# Patient Record
Sex: Male | Born: 1950
Health system: Southern US, Community
[De-identification: ages and names within clinical notes are randomized; demographics above are authoritative.]

## PROBLEM LIST (undated history)

## (undated) DIAGNOSIS — K219 Gastro-esophageal reflux disease without esophagitis: Secondary | ICD-10-CM

## (undated) DIAGNOSIS — R569 Unspecified convulsions: Secondary | ICD-10-CM

## (undated) DIAGNOSIS — Z5189 Encounter for other specified aftercare: Secondary | ICD-10-CM

## (undated) DIAGNOSIS — I4892 Unspecified atrial flutter: Secondary | ICD-10-CM

## (undated) DIAGNOSIS — K449 Diaphragmatic hernia without obstruction or gangrene: Secondary | ICD-10-CM

## (undated) DIAGNOSIS — I10 Essential (primary) hypertension: Secondary | ICD-10-CM

## (undated) DIAGNOSIS — I2510 Atherosclerotic heart disease of native coronary artery without angina pectoris: Secondary | ICD-10-CM

## (undated) DIAGNOSIS — E7800 Pure hypercholesterolemia, unspecified: Secondary | ICD-10-CM

## (undated) DIAGNOSIS — K2270 Barrett's esophagus without dysplasia: Secondary | ICD-10-CM

## (undated) DIAGNOSIS — M792 Neuralgia and neuritis, unspecified: Secondary | ICD-10-CM

## (undated) DIAGNOSIS — T18108A Unspecified foreign body in esophagus causing other injury, initial encounter: Secondary | ICD-10-CM

## (undated) DIAGNOSIS — M961 Postlaminectomy syndrome, not elsewhere classified: Secondary | ICD-10-CM

## (undated) DIAGNOSIS — N189 Chronic kidney disease, unspecified: Secondary | ICD-10-CM

## (undated) DIAGNOSIS — M1990 Unspecified osteoarthritis, unspecified site: Secondary | ICD-10-CM

## (undated) DIAGNOSIS — K222 Esophageal obstruction: Secondary | ICD-10-CM

## (undated) DIAGNOSIS — C3490 Malignant neoplasm of unspecified part of unspecified bronchus or lung: Secondary | ICD-10-CM

## (undated) DIAGNOSIS — F909 Attention-deficit hyperactivity disorder, unspecified type: Secondary | ICD-10-CM

## (undated) DIAGNOSIS — J189 Pneumonia, unspecified organism: Secondary | ICD-10-CM

## (undated) DIAGNOSIS — K759 Inflammatory liver disease, unspecified: Secondary | ICD-10-CM

## (undated) DIAGNOSIS — F419 Anxiety disorder, unspecified: Secondary | ICD-10-CM

## (undated) DIAGNOSIS — G4730 Sleep apnea, unspecified: Secondary | ICD-10-CM

## (undated) DIAGNOSIS — J449 Chronic obstructive pulmonary disease, unspecified: Secondary | ICD-10-CM

## (undated) DIAGNOSIS — K648 Other hemorrhoids: Secondary | ICD-10-CM

## (undated) HISTORY — DX: Essential (primary) hypertension: I10

## (undated) HISTORY — DX: Encounter for other specified aftercare: Z51.89

## (undated) HISTORY — PX: DENTAL TRAUMA REPAIR (TOOTH REIMPLANTATION): SHX1450

## (undated) HISTORY — PX: OTHER SURGICAL HISTORY: SHX169

## (undated) HISTORY — PX: SPLENECTOMY: SUR1306

## (undated) HISTORY — DX: Unspecified osteoarthritis, unspecified site: M19.90

## (undated) HISTORY — DX: Postlaminectomy syndrome, not elsewhere classified: M96.1

## (undated) HISTORY — DX: Barrett's esophagus without dysplasia: K22.70

## (undated) HISTORY — DX: Diaphragmatic hernia without obstruction or gangrene: K44.9

## (undated) HISTORY — DX: Unspecified atrial flutter: I48.92

## (undated) HISTORY — DX: Inflammatory liver disease, unspecified: K75.9

## (undated) HISTORY — DX: Unspecified foreign body in esophagus causing other injury, initial encounter: T18.108A

## (undated) HISTORY — PX: BACK SURGERY: SHX140

## (undated) HISTORY — PX: ILIAC ARTERY STENT: SHX1786

## (undated) HISTORY — DX: Esophageal obstruction: K22.2

## (undated) HISTORY — DX: Attention-deficit hyperactivity disorder, unspecified type: F90.9

## (undated) HISTORY — DX: Gastro-esophageal reflux disease without esophagitis: K21.9

## (undated) HISTORY — PX: SHOULDER SURGERY: SHX246

## (undated) HISTORY — DX: Malignant neoplasm of unspecified part of unspecified bronchus or lung: C34.90

## (undated) HISTORY — DX: Pure hypercholesterolemia, unspecified: E78.00

## (undated) HISTORY — PX: KNEE ARTHROSCOPY: SHX127

## (undated) HISTORY — DX: Atherosclerotic heart disease of native coronary artery without angina pectoris: I25.10

## (undated) HISTORY — PX: ROTATOR CUFF REPAIR: SHX139

## (undated) HISTORY — DX: Other hemorrhoids: K64.8

## (undated) HISTORY — PX: CRANIOTOMY: SHX93

## (undated) HISTORY — PX: CORONARY ANGIOPLASTY: SHX604

## (undated) HISTORY — DX: Chronic obstructive pulmonary disease, unspecified: J44.9

## (undated) HISTORY — PX: EYE SURGERY: SHX253

---

## 1998-02-17 ENCOUNTER — Ambulatory Visit (HOSPITAL_COMMUNITY): Admission: RE | Admit: 1998-02-17 | Discharge: 1998-02-17 | Payer: Self-pay | Admitting: Family Medicine

## 1999-09-02 ENCOUNTER — Encounter: Payer: Self-pay | Admitting: Orthopedic Surgery

## 1999-09-02 ENCOUNTER — Encounter: Admission: RE | Admit: 1999-09-02 | Discharge: 1999-09-02 | Payer: Self-pay | Admitting: Orthopedic Surgery

## 2000-03-16 ENCOUNTER — Observation Stay (HOSPITAL_COMMUNITY): Admission: EM | Admit: 2000-03-16 | Discharge: 2000-03-17 | Payer: Self-pay | Admitting: Emergency Medicine

## 2000-03-16 ENCOUNTER — Encounter: Payer: Self-pay | Admitting: Emergency Medicine

## 2000-03-17 ENCOUNTER — Encounter: Payer: Self-pay | Admitting: Cardiology

## 2000-08-24 ENCOUNTER — Encounter: Admission: RE | Admit: 2000-08-24 | Discharge: 2000-08-24 | Payer: Self-pay | Admitting: Family Medicine

## 2000-08-24 ENCOUNTER — Encounter: Payer: Self-pay | Admitting: Family Medicine

## 2000-08-27 ENCOUNTER — Encounter: Admission: RE | Admit: 2000-08-27 | Discharge: 2000-08-27 | Payer: Self-pay | Admitting: Family Medicine

## 2000-08-27 ENCOUNTER — Encounter: Payer: Self-pay | Admitting: Family Medicine

## 2000-09-11 ENCOUNTER — Encounter: Admission: RE | Admit: 2000-09-11 | Discharge: 2000-12-10 | Payer: Self-pay | Admitting: Anesthesiology

## 2000-12-30 ENCOUNTER — Encounter: Admission: RE | Admit: 2000-12-30 | Discharge: 2001-01-23 | Payer: Self-pay | Admitting: Anesthesiology

## 2001-03-03 ENCOUNTER — Ambulatory Visit (HOSPITAL_COMMUNITY): Admission: RE | Admit: 2001-03-03 | Discharge: 2001-03-03 | Payer: Self-pay | Admitting: Neurological Surgery

## 2001-03-03 ENCOUNTER — Encounter: Payer: Self-pay | Admitting: Neurological Surgery

## 2001-05-26 DIAGNOSIS — K222 Esophageal obstruction: Secondary | ICD-10-CM

## 2001-05-26 DIAGNOSIS — T18108A Unspecified foreign body in esophagus causing other injury, initial encounter: Secondary | ICD-10-CM

## 2001-05-26 DIAGNOSIS — K219 Gastro-esophageal reflux disease without esophagitis: Secondary | ICD-10-CM

## 2001-05-26 DIAGNOSIS — K449 Diaphragmatic hernia without obstruction or gangrene: Secondary | ICD-10-CM

## 2001-05-26 HISTORY — DX: Gastro-esophageal reflux disease without esophagitis: K21.9

## 2001-05-26 HISTORY — DX: Diaphragmatic hernia without obstruction or gangrene: K44.9

## 2001-05-26 HISTORY — DX: Unspecified foreign body in esophagus causing other injury, initial encounter: T18.108A

## 2001-05-26 HISTORY — DX: Esophageal obstruction: K22.2

## 2001-09-02 ENCOUNTER — Ambulatory Visit (HOSPITAL_COMMUNITY): Admission: RE | Admit: 2001-09-02 | Discharge: 2001-09-02 | Payer: Self-pay | Admitting: Orthopedic Surgery

## 2001-09-02 ENCOUNTER — Encounter: Payer: Self-pay | Admitting: Orthopedic Surgery

## 2002-04-08 ENCOUNTER — Ambulatory Visit (HOSPITAL_COMMUNITY): Admission: RE | Admit: 2002-04-08 | Discharge: 2002-04-08 | Payer: Self-pay | Admitting: Anesthesiology

## 2002-04-08 ENCOUNTER — Encounter: Payer: Self-pay | Admitting: Anesthesiology

## 2002-05-02 ENCOUNTER — Encounter: Admission: RE | Admit: 2002-05-02 | Discharge: 2002-05-02 | Payer: Self-pay | Admitting: Family Medicine

## 2002-05-02 ENCOUNTER — Encounter: Payer: Self-pay | Admitting: Family Medicine

## 2002-05-17 ENCOUNTER — Ambulatory Visit (HOSPITAL_COMMUNITY): Admission: EM | Admit: 2002-05-17 | Discharge: 2002-05-17 | Payer: Self-pay | Admitting: Emergency Medicine

## 2002-10-11 ENCOUNTER — Encounter: Admission: RE | Admit: 2002-10-11 | Discharge: 2002-10-11 | Payer: Self-pay | Admitting: Orthopedic Surgery

## 2002-10-11 ENCOUNTER — Encounter: Payer: Self-pay | Admitting: Orthopedic Surgery

## 2002-11-09 ENCOUNTER — Ambulatory Visit (HOSPITAL_BASED_OUTPATIENT_CLINIC_OR_DEPARTMENT_OTHER): Admission: RE | Admit: 2002-11-09 | Discharge: 2002-11-09 | Payer: Self-pay | Admitting: Orthopedic Surgery

## 2002-11-15 ENCOUNTER — Encounter: Admission: RE | Admit: 2002-11-15 | Discharge: 2002-12-01 | Payer: Self-pay | Admitting: Orthopedic Surgery

## 2003-08-08 ENCOUNTER — Encounter: Admission: RE | Admit: 2003-08-08 | Discharge: 2003-08-08 | Payer: Self-pay | Admitting: Specialist

## 2003-10-05 ENCOUNTER — Ambulatory Visit (HOSPITAL_COMMUNITY): Admission: RE | Admit: 2003-10-05 | Discharge: 2003-10-05 | Payer: Self-pay | Admitting: Vascular Surgery

## 2003-10-22 ENCOUNTER — Emergency Department (HOSPITAL_COMMUNITY): Admission: EM | Admit: 2003-10-22 | Discharge: 2003-10-22 | Payer: Self-pay | Admitting: Emergency Medicine

## 2003-12-14 ENCOUNTER — Ambulatory Visit (HOSPITAL_COMMUNITY): Admission: RE | Admit: 2003-12-14 | Discharge: 2003-12-15 | Payer: Self-pay | Admitting: Specialist

## 2003-12-14 ENCOUNTER — Encounter (INDEPENDENT_AMBULATORY_CARE_PROVIDER_SITE_OTHER): Payer: Self-pay | Admitting: Specialist

## 2004-04-24 ENCOUNTER — Ambulatory Visit: Payer: Self-pay | Admitting: Oncology

## 2004-06-19 ENCOUNTER — Ambulatory Visit: Payer: Self-pay | Admitting: Oncology

## 2004-08-05 ENCOUNTER — Ambulatory Visit: Payer: Self-pay | Admitting: Oncology

## 2004-12-26 ENCOUNTER — Ambulatory Visit (HOSPITAL_COMMUNITY): Admission: RE | Admit: 2004-12-26 | Discharge: 2004-12-26 | Payer: Self-pay | Admitting: Specialist

## 2005-01-31 ENCOUNTER — Ambulatory Visit: Payer: Self-pay | Admitting: Oncology

## 2005-03-10 ENCOUNTER — Encounter: Admission: RE | Admit: 2005-03-10 | Discharge: 2005-04-08 | Payer: Self-pay | Admitting: Specialist

## 2005-04-04 ENCOUNTER — Ambulatory Visit: Payer: Self-pay | Admitting: Oncology

## 2005-06-23 ENCOUNTER — Ambulatory Visit (HOSPITAL_COMMUNITY): Admission: RE | Admit: 2005-06-23 | Discharge: 2005-06-23 | Payer: Self-pay | Admitting: Specialist

## 2005-07-01 ENCOUNTER — Ambulatory Visit: Payer: Self-pay | Admitting: Oncology

## 2005-09-05 ENCOUNTER — Ambulatory Visit: Payer: Self-pay | Admitting: Oncology

## 2005-09-08 ENCOUNTER — Ambulatory Visit (HOSPITAL_COMMUNITY): Admission: RE | Admit: 2005-09-08 | Discharge: 2005-09-09 | Payer: Self-pay | Admitting: Specialist

## 2005-12-10 ENCOUNTER — Ambulatory Visit: Payer: Self-pay | Admitting: Oncology

## 2005-12-11 LAB — BASIC METABOLIC PANEL
BUN: 17 mg/dL (ref 6–23)
CO2: 27 mEq/L (ref 19–32)
Calcium: 9.4 mg/dL (ref 8.4–10.5)
Chloride: 102 mEq/L (ref 96–112)
Creatinine, Ser: 1.2 mg/dL (ref 0.40–1.50)
Glucose, Bld: 122 mg/dL — ABNORMAL HIGH (ref 70–99)
Potassium: 4.5 mEq/L (ref 3.5–5.3)
Sodium: 137 mEq/L (ref 135–145)

## 2005-12-11 LAB — CBC WITH DIFFERENTIAL (CANCER CENTER ONLY)
BASO#: 0.1 10*3/uL (ref 0.0–0.2)
BASO%: 1.1 % (ref 0.0–2.0)
EOS%: 3.1 % (ref 0.0–7.0)
Eosinophils Absolute: 0.2 10*3/uL (ref 0.0–0.5)
HCT: 50.2 % — ABNORMAL HIGH (ref 38.7–49.9)
HGB: 16.5 g/dL (ref 13.0–17.1)
LYMPH#: 3.5 10*3/uL — ABNORMAL HIGH (ref 0.9–3.3)
LYMPH%: 48 % (ref 14.0–48.0)
MCH: 28.8 pg (ref 28.0–33.4)
MCHC: 32.9 g/dL (ref 32.0–35.9)
MCV: 88 fL (ref 82–98)
MONO#: 0.5 10*3/uL (ref 0.1–0.9)
MONO%: 6.2 % (ref 0.0–13.0)
NEUT#: 3 10*3/uL (ref 1.5–6.5)
NEUT%: 41.6 % (ref 40.0–80.0)
Platelets: 235 10*3/uL (ref 145–400)
RBC: 5.73 10*6/uL — ABNORMAL HIGH (ref 4.20–5.70)
RDW: 13.5 % (ref 10.5–14.6)

## 2005-12-11 LAB — LACTATE DEHYDROGENASE: LDH: 125 U/L (ref 94–250)

## 2006-01-29 ENCOUNTER — Encounter: Payer: Self-pay | Admitting: Vascular Surgery

## 2006-01-29 ENCOUNTER — Ambulatory Visit (HOSPITAL_COMMUNITY): Admission: RE | Admit: 2006-01-29 | Discharge: 2006-01-29 | Payer: Self-pay | Admitting: Vascular Surgery

## 2006-05-26 HISTORY — PX: CORONARY ARTERY BYPASS GRAFT: SHX141

## 2006-06-01 ENCOUNTER — Encounter: Admission: RE | Admit: 2006-06-01 | Discharge: 2006-06-01 | Payer: Self-pay | Admitting: Specialist

## 2006-06-02 ENCOUNTER — Ambulatory Visit (HOSPITAL_COMMUNITY): Admission: RE | Admit: 2006-06-02 | Discharge: 2006-06-02 | Payer: Self-pay | Admitting: Specialist

## 2006-08-05 ENCOUNTER — Ambulatory Visit: Payer: Self-pay | Admitting: Cardiology

## 2006-08-05 LAB — CONVERTED CEMR LAB
BUN: 13 mg/dL (ref 6–23)
Basophils Absolute: 0.1 10*3/uL (ref 0.0–0.1)
Basophils Relative: 0.7 % (ref 0.0–1.0)
CO2: 31 meq/L (ref 19–32)
Calcium: 9 mg/dL (ref 8.4–10.5)
Chloride: 101 meq/L (ref 96–112)
Creatinine, Ser: 1.1 mg/dL (ref 0.4–1.5)
Eosinophils Absolute: 0.5 10*3/uL (ref 0.0–0.6)
Eosinophils Relative: 3.2 % (ref 0.0–5.0)
GFR calc Af Amer: 89 mL/min
GFR calc non Af Amer: 74 mL/min
Glucose, Bld: 104 mg/dL — ABNORMAL HIGH (ref 70–99)
HCT: 45 % (ref 39.0–52.0)
Hemoglobin: 15.4 g/dL (ref 13.0–17.0)
INR: 0.8 — ABNORMAL LOW (ref 0.9–2.0)
Lymphocytes Relative: 39.3 % (ref 12.0–46.0)
MCHC: 34.2 g/dL (ref 30.0–36.0)
MCV: 86.8 fL (ref 78.0–100.0)
Monocytes Absolute: 1.4 10*3/uL — ABNORMAL HIGH (ref 0.2–0.7)
Monocytes Relative: 9.7 % (ref 3.0–11.0)
Neutro Abs: 6.9 10*3/uL (ref 1.4–7.7)
Neutrophils Relative %: 47.1 % (ref 43.0–77.0)
Platelets: 281 10*3/uL (ref 150–400)
Potassium: 4.3 meq/L (ref 3.5–5.1)
Prothrombin Time: 11.1 s (ref 10.0–14.0)
RBC: 5.18 M/uL (ref 4.22–5.81)
RDW: 13.7 % (ref 11.5–14.6)
Sodium: 138 meq/L (ref 135–145)
WBC: 14.6 10*3/uL — ABNORMAL HIGH (ref 4.5–10.5)
aPTT: 34.2 s (ref 26.5–36.5)

## 2006-08-07 ENCOUNTER — Ambulatory Visit: Payer: Self-pay | Admitting: Cardiology

## 2006-08-07 ENCOUNTER — Inpatient Hospital Stay (HOSPITAL_BASED_OUTPATIENT_CLINIC_OR_DEPARTMENT_OTHER): Admission: RE | Admit: 2006-08-07 | Discharge: 2006-08-07 | Payer: Self-pay | Admitting: Cardiology

## 2006-08-10 ENCOUNTER — Ambulatory Visit: Payer: Self-pay | Admitting: Thoracic Surgery (Cardiothoracic Vascular Surgery)

## 2006-08-10 ENCOUNTER — Ambulatory Visit: Payer: Self-pay

## 2006-08-12 ENCOUNTER — Encounter
Admission: RE | Admit: 2006-08-12 | Discharge: 2006-08-12 | Payer: Self-pay | Admitting: Thoracic Surgery (Cardiothoracic Vascular Surgery)

## 2006-08-13 ENCOUNTER — Inpatient Hospital Stay (HOSPITAL_COMMUNITY)
Admission: RE | Admit: 2006-08-13 | Discharge: 2006-08-17 | Payer: Self-pay | Admitting: Thoracic Surgery (Cardiothoracic Vascular Surgery)

## 2006-08-13 ENCOUNTER — Ambulatory Visit: Payer: Self-pay | Admitting: Thoracic Surgery (Cardiothoracic Vascular Surgery)

## 2006-09-01 ENCOUNTER — Ambulatory Visit: Payer: Self-pay | Admitting: Cardiology

## 2006-09-02 ENCOUNTER — Ambulatory Visit: Payer: Self-pay | Admitting: Internal Medicine

## 2006-09-02 ENCOUNTER — Inpatient Hospital Stay (HOSPITAL_COMMUNITY): Admission: AD | Admit: 2006-09-02 | Discharge: 2006-09-05 | Payer: Self-pay | Admitting: Cardiology

## 2006-09-04 ENCOUNTER — Encounter: Payer: Self-pay | Admitting: Internal Medicine

## 2006-09-07 ENCOUNTER — Ambulatory Visit: Payer: Self-pay | Admitting: Thoracic Surgery (Cardiothoracic Vascular Surgery)

## 2006-09-09 ENCOUNTER — Ambulatory Visit: Payer: Self-pay | Admitting: Cardiology

## 2006-10-06 ENCOUNTER — Ambulatory Visit: Payer: Self-pay | Admitting: Internal Medicine

## 2006-10-27 ENCOUNTER — Ambulatory Visit: Payer: Self-pay | Admitting: Vascular Surgery

## 2006-11-17 ENCOUNTER — Ambulatory Visit: Payer: Self-pay | Admitting: Cardiology

## 2006-12-07 ENCOUNTER — Encounter
Admission: RE | Admit: 2006-12-07 | Discharge: 2006-12-07 | Payer: Self-pay | Admitting: Thoracic Surgery (Cardiothoracic Vascular Surgery)

## 2006-12-07 ENCOUNTER — Ambulatory Visit: Payer: Self-pay | Admitting: Thoracic Surgery (Cardiothoracic Vascular Surgery)

## 2007-03-10 ENCOUNTER — Ambulatory Visit: Payer: Self-pay | Admitting: Cardiovascular Disease

## 2007-07-22 ENCOUNTER — Encounter: Admission: RE | Admit: 2007-07-22 | Discharge: 2007-07-22 | Payer: Self-pay | Admitting: Specialist

## 2007-09-14 ENCOUNTER — Ambulatory Visit: Payer: Self-pay | Admitting: Vascular Surgery

## 2007-09-15 ENCOUNTER — Encounter: Admission: RE | Admit: 2007-09-15 | Discharge: 2007-09-15 | Payer: Self-pay | Admitting: Specialist

## 2007-09-29 ENCOUNTER — Ambulatory Visit: Payer: Self-pay | Admitting: Cardiology

## 2007-10-08 ENCOUNTER — Ambulatory Visit (HOSPITAL_COMMUNITY): Admission: RE | Admit: 2007-10-08 | Discharge: 2007-10-09 | Payer: Self-pay | Admitting: Specialist

## 2007-11-23 ENCOUNTER — Ambulatory Visit: Payer: Self-pay | Admitting: Vascular Surgery

## 2007-12-22 ENCOUNTER — Encounter: Admission: RE | Admit: 2007-12-22 | Discharge: 2007-12-22 | Payer: Self-pay | Admitting: Specialist

## 2007-12-23 ENCOUNTER — Encounter: Admission: RE | Admit: 2007-12-23 | Discharge: 2007-12-23 | Payer: Self-pay | Admitting: Specialist

## 2007-12-23 ENCOUNTER — Encounter: Payer: Self-pay | Admitting: Family Medicine

## 2008-01-24 ENCOUNTER — Encounter: Admission: RE | Admit: 2008-01-24 | Discharge: 2008-01-24 | Payer: Self-pay | Admitting: Specialist

## 2008-02-19 ENCOUNTER — Encounter: Payer: Self-pay | Admitting: Family Medicine

## 2008-02-19 ENCOUNTER — Encounter: Admission: RE | Admit: 2008-02-19 | Discharge: 2008-02-19 | Payer: Self-pay | Admitting: Specialist

## 2008-03-07 ENCOUNTER — Ambulatory Visit (HOSPITAL_COMMUNITY): Admission: RE | Admit: 2008-03-07 | Discharge: 2008-03-07 | Payer: Self-pay | Admitting: Specialist

## 2008-03-08 ENCOUNTER — Encounter: Admission: RE | Admit: 2008-03-08 | Discharge: 2008-03-08 | Payer: Self-pay | Admitting: Family Medicine

## 2008-03-10 ENCOUNTER — Ambulatory Visit: Payer: Self-pay | Admitting: Cardiology

## 2008-03-26 HISTORY — PX: NECK SURGERY: SHX720

## 2008-04-11 ENCOUNTER — Ambulatory Visit (HOSPITAL_COMMUNITY): Admission: RE | Admit: 2008-04-11 | Discharge: 2008-04-12 | Payer: Self-pay | Admitting: Specialist

## 2008-04-14 ENCOUNTER — Ambulatory Visit: Payer: Self-pay | Admitting: Cardiovascular Disease

## 2008-04-14 LAB — CONVERTED CEMR LAB
CK-MB: 2.7 ng/mL (ref 0.3–4.0)
Total CK: 74 units/L (ref 7–195)

## 2008-04-19 ENCOUNTER — Ambulatory Visit: Payer: Self-pay

## 2008-05-03 ENCOUNTER — Ambulatory Visit: Payer: Self-pay | Admitting: Cardiology

## 2008-05-26 DIAGNOSIS — K648 Other hemorrhoids: Secondary | ICD-10-CM

## 2008-05-26 HISTORY — PX: ANGIOPLASTY: SHX39

## 2008-05-26 HISTORY — DX: Other hemorrhoids: K64.8

## 2008-05-26 HISTORY — PX: ILIAC ARTERY STENT: SHX1786

## 2008-05-30 ENCOUNTER — Ambulatory Visit: Payer: Self-pay | Admitting: Vascular Surgery

## 2008-06-20 ENCOUNTER — Ambulatory Visit (HOSPITAL_COMMUNITY): Admission: RE | Admit: 2008-06-20 | Discharge: 2008-06-20 | Payer: Self-pay | Admitting: Psychology

## 2008-06-20 ENCOUNTER — Ambulatory Visit: Payer: Self-pay | Admitting: Surgery

## 2008-07-11 ENCOUNTER — Ambulatory Visit: Payer: Self-pay | Admitting: Vascular Surgery

## 2008-09-08 ENCOUNTER — Encounter: Admission: RE | Admit: 2008-09-08 | Discharge: 2008-09-08 | Payer: Self-pay | Admitting: Specialist

## 2008-09-11 ENCOUNTER — Encounter: Payer: Self-pay | Admitting: Family Medicine

## 2008-09-15 ENCOUNTER — Encounter: Admission: RE | Admit: 2008-09-15 | Discharge: 2008-09-15 | Payer: Self-pay | Admitting: Specialist

## 2008-10-10 ENCOUNTER — Telehealth: Payer: Self-pay | Admitting: Cardiology

## 2008-12-27 ENCOUNTER — Encounter: Payer: Self-pay | Admitting: Family Medicine

## 2008-12-28 ENCOUNTER — Encounter: Payer: Self-pay | Admitting: Family Medicine

## 2008-12-28 ENCOUNTER — Encounter: Admission: RE | Admit: 2008-12-28 | Discharge: 2008-12-28 | Payer: Self-pay | Admitting: Specialist

## 2008-12-29 ENCOUNTER — Telehealth: Payer: Self-pay | Admitting: Family Medicine

## 2009-01-01 ENCOUNTER — Ambulatory Visit: Payer: Self-pay | Admitting: Family Medicine

## 2009-01-01 DIAGNOSIS — M129 Arthropathy, unspecified: Secondary | ICD-10-CM | POA: Insufficient documentation

## 2009-01-01 DIAGNOSIS — J449 Chronic obstructive pulmonary disease, unspecified: Secondary | ICD-10-CM | POA: Insufficient documentation

## 2009-01-01 DIAGNOSIS — E7800 Pure hypercholesterolemia, unspecified: Secondary | ICD-10-CM | POA: Insufficient documentation

## 2009-01-01 DIAGNOSIS — K759 Inflammatory liver disease, unspecified: Secondary | ICD-10-CM | POA: Insufficient documentation

## 2009-01-01 DIAGNOSIS — R5381 Other malaise: Secondary | ICD-10-CM | POA: Insufficient documentation

## 2009-01-01 DIAGNOSIS — R5383 Other fatigue: Secondary | ICD-10-CM

## 2009-01-01 DIAGNOSIS — J4489 Other specified chronic obstructive pulmonary disease: Secondary | ICD-10-CM | POA: Insufficient documentation

## 2009-01-01 DIAGNOSIS — I10 Essential (primary) hypertension: Secondary | ICD-10-CM | POA: Insufficient documentation

## 2009-01-01 DIAGNOSIS — K219 Gastro-esophageal reflux disease without esophagitis: Secondary | ICD-10-CM | POA: Insufficient documentation

## 2009-01-04 LAB — CONVERTED CEMR LAB
ALT: 11 units/L (ref 0–53)
AST: 25 units/L (ref 0–37)
Albumin: 3.8 g/dL (ref 3.5–5.2)
Alkaline Phosphatase: 79 units/L (ref 39–117)
BUN: 27 mg/dL — ABNORMAL HIGH (ref 6–23)
Basophils Absolute: 0 10*3/uL (ref 0.0–0.1)
Basophils Relative: 0 % (ref 0.0–3.0)
Bilirubin, Direct: 0.1 mg/dL (ref 0.0–0.3)
CO2: 30 meq/L (ref 19–32)
CRP, High Sensitivity: 2 (ref 0.00–5.00)
Calcium: 9.3 mg/dL (ref 8.4–10.5)
Chloride: 104 meq/L (ref 96–112)
Creatinine, Ser: 1.5 mg/dL (ref 0.4–1.5)
Eosinophils Absolute: 0.3 10*3/uL (ref 0.0–0.7)
Eosinophils Relative: 2.1 % (ref 0.0–5.0)
GFR calc non Af Amer: 51.02 mL/min (ref >60)
Glucose, Bld: 85 mg/dL (ref 70–99)
HCT: 46.1 % (ref 39.0–52.0)
Hemoglobin: 15.4 g/dL (ref 13.0–17.0)
Lymphocytes Relative: 35.9 % (ref 12.0–46.0)
Lymphs Abs: 4.5 10*3/uL — ABNORMAL HIGH (ref 0.7–4.0)
MCHC: 33.4 g/dL (ref 30.0–36.0)
MCV: 93 fL (ref 78.0–100.0)
Monocytes Absolute: 0.4 10*3/uL (ref 0.1–1.0)
Monocytes Relative: 3 % (ref 3.0–12.0)
Neutro Abs: 7.2 10*3/uL (ref 1.4–7.7)
Neutrophils Relative %: 59 % (ref 43.0–77.0)
PSA: 0.28 ng/mL (ref 0.10–4.00)
Platelets: 216 10*3/uL (ref 150.0–400.0)
Potassium: 4.7 meq/L (ref 3.5–5.1)
RBC: 4.95 M/uL (ref 4.22–5.81)
RDW: 14 % (ref 11.5–14.6)
Sed Rate: 2 mm/hr (ref 0–22)
Sodium: 139 meq/L (ref 135–145)
TSH: 1.07 microintl units/mL (ref 0.35–5.50)
Total Bilirubin: 0.7 mg/dL (ref 0.3–1.2)
Total Protein: 7.3 g/dL (ref 6.0–8.3)
WBC: 12.4 10*3/uL — ABNORMAL HIGH (ref 4.5–10.5)

## 2009-01-12 ENCOUNTER — Ambulatory Visit: Payer: Self-pay | Admitting: Internal Medicine

## 2009-01-19 ENCOUNTER — Ambulatory Visit: Payer: Self-pay | Admitting: Internal Medicine

## 2009-02-07 ENCOUNTER — Ambulatory Visit: Payer: Self-pay | Admitting: Internal Medicine

## 2009-02-13 ENCOUNTER — Ambulatory Visit: Payer: Self-pay | Admitting: Internal Medicine

## 2009-02-14 ENCOUNTER — Encounter (INDEPENDENT_AMBULATORY_CARE_PROVIDER_SITE_OTHER): Payer: Self-pay | Admitting: *Deleted

## 2009-03-23 ENCOUNTER — Ambulatory Visit: Payer: Self-pay | Admitting: Internal Medicine

## 2009-03-23 ENCOUNTER — Encounter: Payer: Self-pay | Admitting: Internal Medicine

## 2009-04-03 ENCOUNTER — Encounter (INDEPENDENT_AMBULATORY_CARE_PROVIDER_SITE_OTHER): Payer: Self-pay | Admitting: *Deleted

## 2009-05-02 ENCOUNTER — Telehealth: Payer: Self-pay | Admitting: Cardiology

## 2009-05-03 DIAGNOSIS — I25810 Atherosclerosis of coronary artery bypass graft(s) without angina pectoris: Secondary | ICD-10-CM | POA: Insufficient documentation

## 2009-05-22 ENCOUNTER — Ambulatory Visit: Payer: Self-pay | Admitting: Cardiology

## 2009-05-23 ENCOUNTER — Telehealth: Payer: Self-pay | Admitting: Family Medicine

## 2009-05-24 ENCOUNTER — Ambulatory Visit: Payer: Self-pay | Admitting: Family Medicine

## 2009-05-26 HISTORY — PX: SHOULDER SURGERY: SHX246

## 2009-06-06 LAB — CONVERTED CEMR LAB
ALT: 25 units/L (ref 0–53)
AST: 43 units/L — ABNORMAL HIGH (ref 0–37)
Albumin: 3.7 g/dL (ref 3.5–5.2)
Alkaline Phosphatase: 86 units/L (ref 39–117)
Bilirubin, Direct: 0.1 mg/dL (ref 0.0–0.3)
Cholesterol: 179 mg/dL (ref 0–200)
HDL: 37.8 mg/dL — ABNORMAL LOW (ref >39.00)
LDL Cholesterol: 115 mg/dL — ABNORMAL HIGH (ref 0–99)
Total Bilirubin: 0.8 mg/dL (ref 0.3–1.2)
Total CHOL/HDL Ratio: 5
Total Protein: 6.8 g/dL (ref 6.0–8.3)
Triglycerides: 133 mg/dL (ref 0.0–149.0)
VLDL: 26.6 mg/dL (ref 0.0–40.0)

## 2009-06-07 ENCOUNTER — Ambulatory Visit: Payer: Self-pay | Admitting: Internal Medicine

## 2009-06-18 ENCOUNTER — Telehealth (INDEPENDENT_AMBULATORY_CARE_PROVIDER_SITE_OTHER): Payer: Self-pay | Admitting: *Deleted

## 2009-08-21 ENCOUNTER — Telehealth: Payer: Self-pay | Admitting: Internal Medicine

## 2009-08-22 ENCOUNTER — Ambulatory Visit: Payer: Self-pay | Admitting: Internal Medicine

## 2009-09-12 ENCOUNTER — Telehealth: Payer: Self-pay | Admitting: Family Medicine

## 2009-10-01 ENCOUNTER — Telehealth (INDEPENDENT_AMBULATORY_CARE_PROVIDER_SITE_OTHER): Payer: Self-pay | Admitting: *Deleted

## 2009-10-17 ENCOUNTER — Telehealth: Payer: Self-pay | Admitting: Pulmonary Disease

## 2009-10-26 ENCOUNTER — Ambulatory Visit: Payer: Self-pay | Admitting: Internal Medicine

## 2009-11-02 ENCOUNTER — Telehealth: Payer: Self-pay | Admitting: Internal Medicine

## 2009-11-09 ENCOUNTER — Ambulatory Visit: Payer: Self-pay | Admitting: Internal Medicine

## 2009-11-15 ENCOUNTER — Ambulatory Visit: Payer: Self-pay | Admitting: Family Medicine

## 2009-11-15 DIAGNOSIS — N529 Male erectile dysfunction, unspecified: Secondary | ICD-10-CM | POA: Insufficient documentation

## 2009-11-16 ENCOUNTER — Encounter: Payer: Self-pay | Admitting: Family Medicine

## 2009-11-16 LAB — CONVERTED CEMR LAB
ALT: 12 units/L (ref 0–53)
Albumin: 4 g/dL (ref 3.5–5.2)
Alkaline Phosphatase: 93 units/L (ref 39–117)
BUN: 31 mg/dL — ABNORMAL HIGH (ref 6–23)
Basophils Relative: 0.7 % (ref 0.0–3.0)
CO2: 31 meq/L (ref 19–32)
Calcium: 9.7 mg/dL (ref 8.4–10.5)
Creatinine, Ser: 1.8 mg/dL — ABNORMAL HIGH (ref 0.4–1.5)
Lymphs Abs: 5.2 10*3/uL — ABNORMAL HIGH (ref 0.7–4.0)
MCV: 90.1 fL (ref 78.0–100.0)
Neutro Abs: 4.6 10*3/uL (ref 1.4–7.7)
Platelets: 292 10*3/uL (ref 150.0–400.0)
Potassium: 5 meq/L (ref 3.5–5.1)
RBC: 4.9 M/uL (ref 4.22–5.81)
RDW: 14.3 % (ref 11.5–14.6)
Sodium: 141 meq/L (ref 135–145)
Total Bilirubin: 0.6 mg/dL (ref 0.3–1.2)
WBC: 11.9 10*3/uL — ABNORMAL HIGH (ref 4.5–10.5)

## 2009-11-20 ENCOUNTER — Telehealth: Payer: Self-pay | Admitting: Family Medicine

## 2009-11-20 LAB — CONVERTED CEMR LAB
Sex Hormone Binding: 58 nmol/L (ref 13–71)
Testosterone: 356.39 ng/dL (ref 350–890)

## 2009-11-28 ENCOUNTER — Encounter: Payer: Self-pay | Admitting: Critical Care Medicine

## 2009-11-28 ENCOUNTER — Ambulatory Visit: Payer: Self-pay | Admitting: Critical Care Medicine

## 2009-11-28 ENCOUNTER — Inpatient Hospital Stay (HOSPITAL_COMMUNITY): Admission: EM | Admit: 2009-11-28 | Discharge: 2009-12-03 | Payer: Self-pay | Admitting: Emergency Medicine

## 2009-11-28 ENCOUNTER — Encounter (INDEPENDENT_AMBULATORY_CARE_PROVIDER_SITE_OTHER): Payer: Self-pay | Admitting: *Deleted

## 2009-12-03 ENCOUNTER — Telehealth: Payer: Self-pay | Admitting: Internal Medicine

## 2009-12-04 ENCOUNTER — Encounter: Payer: Self-pay | Admitting: Family Medicine

## 2009-12-06 ENCOUNTER — Emergency Department (HOSPITAL_COMMUNITY): Admission: EM | Admit: 2009-12-06 | Discharge: 2009-12-06 | Payer: Self-pay | Admitting: Emergency Medicine

## 2009-12-07 ENCOUNTER — Ambulatory Visit: Payer: Self-pay | Admitting: Internal Medicine

## 2009-12-07 ENCOUNTER — Telehealth (INDEPENDENT_AMBULATORY_CARE_PROVIDER_SITE_OTHER): Payer: Self-pay | Admitting: *Deleted

## 2009-12-10 ENCOUNTER — Encounter: Payer: Self-pay | Admitting: Family Medicine

## 2009-12-10 ENCOUNTER — Telehealth (INDEPENDENT_AMBULATORY_CARE_PROVIDER_SITE_OTHER): Payer: Self-pay | Admitting: *Deleted

## 2009-12-10 ENCOUNTER — Telehealth: Payer: Self-pay | Admitting: Family Medicine

## 2009-12-10 ENCOUNTER — Ambulatory Visit: Payer: Self-pay | Admitting: Critical Care Medicine

## 2009-12-10 ENCOUNTER — Encounter: Payer: Self-pay | Admitting: Internal Medicine

## 2009-12-10 DIAGNOSIS — B009 Herpesviral infection, unspecified: Secondary | ICD-10-CM | POA: Insufficient documentation

## 2009-12-12 ENCOUNTER — Encounter: Admission: RE | Admit: 2009-12-12 | Discharge: 2009-12-12 | Payer: Self-pay | Admitting: Specialist

## 2009-12-13 ENCOUNTER — Encounter: Payer: Self-pay | Admitting: Family Medicine

## 2009-12-19 ENCOUNTER — Telehealth (INDEPENDENT_AMBULATORY_CARE_PROVIDER_SITE_OTHER): Payer: Self-pay | Admitting: *Deleted

## 2009-12-21 ENCOUNTER — Encounter: Payer: Self-pay | Admitting: Cardiology

## 2009-12-26 ENCOUNTER — Ambulatory Visit: Payer: Self-pay | Admitting: Internal Medicine

## 2009-12-28 ENCOUNTER — Telehealth: Payer: Self-pay | Admitting: Family Medicine

## 2009-12-28 ENCOUNTER — Telehealth (INDEPENDENT_AMBULATORY_CARE_PROVIDER_SITE_OTHER): Payer: Self-pay | Admitting: *Deleted

## 2009-12-28 LAB — CONVERTED CEMR LAB
Albumin: 3.4 g/dL — ABNORMAL LOW (ref 3.5–5.2)
Alkaline Phosphatase: 143 units/L — ABNORMAL HIGH (ref 39–117)
BUN: 22 mg/dL (ref 6–23)
Calcium: 9.2 mg/dL (ref 8.4–10.5)
GFR calc non Af Amer: 61.62 mL/min (ref >60)
Glucose, Bld: 82 mg/dL (ref 70–99)
Sodium: 139 meq/L (ref 135–145)

## 2009-12-31 ENCOUNTER — Ambulatory Visit: Payer: Self-pay | Admitting: Family Medicine

## 2009-12-31 ENCOUNTER — Telehealth: Payer: Self-pay | Admitting: Cardiology

## 2010-01-01 ENCOUNTER — Ambulatory Visit: Payer: Self-pay | Admitting: Cardiology

## 2010-01-02 ENCOUNTER — Telehealth (INDEPENDENT_AMBULATORY_CARE_PROVIDER_SITE_OTHER): Payer: Self-pay | Admitting: *Deleted

## 2010-01-03 ENCOUNTER — Ambulatory Visit: Payer: Self-pay

## 2010-01-03 ENCOUNTER — Encounter: Payer: Self-pay | Admitting: Cardiology

## 2010-01-03 ENCOUNTER — Encounter: Payer: Self-pay | Admitting: Internal Medicine

## 2010-01-03 ENCOUNTER — Encounter (HOSPITAL_COMMUNITY): Admission: RE | Admit: 2010-01-03 | Discharge: 2010-02-19 | Payer: Self-pay | Admitting: Cardiology

## 2010-01-03 ENCOUNTER — Ambulatory Visit: Payer: Self-pay | Admitting: Internal Medicine

## 2010-01-08 ENCOUNTER — Telehealth (INDEPENDENT_AMBULATORY_CARE_PROVIDER_SITE_OTHER): Payer: Self-pay | Admitting: *Deleted

## 2010-01-08 ENCOUNTER — Ambulatory Visit: Payer: Self-pay | Admitting: Internal Medicine

## 2010-01-09 ENCOUNTER — Telehealth (INDEPENDENT_AMBULATORY_CARE_PROVIDER_SITE_OTHER): Payer: Self-pay | Admitting: *Deleted

## 2010-01-10 ENCOUNTER — Ambulatory Visit: Payer: Self-pay | Admitting: Adult Health

## 2010-01-11 ENCOUNTER — Telehealth: Payer: Self-pay | Admitting: Family Medicine

## 2010-01-11 LAB — CONVERTED CEMR LAB
Chloride: 101 meq/L (ref 96–112)
Cholesterol: 221 mg/dL — ABNORMAL HIGH (ref 0–200)
HDL: 48 mg/dL (ref >39.00)
Potassium: 5.6 meq/L — ABNORMAL HIGH (ref 3.5–5.1)
Pro B Natriuretic peptide (BNP): 454.1 pg/mL — ABNORMAL HIGH (ref 0.0–100.0)
Sed Rate: 16 mm/hr (ref 0–22)
VLDL: 24.8 mg/dL (ref 0.0–40.0)

## 2010-01-15 ENCOUNTER — Ambulatory Visit: Payer: Self-pay | Admitting: Cardiovascular Disease

## 2010-01-18 ENCOUNTER — Telehealth (INDEPENDENT_AMBULATORY_CARE_PROVIDER_SITE_OTHER): Payer: Self-pay | Admitting: *Deleted

## 2010-01-21 ENCOUNTER — Inpatient Hospital Stay (HOSPITAL_COMMUNITY): Admission: RE | Admit: 2010-01-21 | Discharge: 2010-01-23 | Payer: Self-pay | Admitting: Specialist

## 2010-01-30 ENCOUNTER — Telehealth: Payer: Self-pay | Admitting: Adult Health

## 2010-02-14 ENCOUNTER — Ambulatory Visit: Payer: Self-pay | Admitting: Family Medicine

## 2010-02-14 DIAGNOSIS — N411 Chronic prostatitis: Secondary | ICD-10-CM | POA: Insufficient documentation

## 2010-03-19 ENCOUNTER — Encounter: Payer: Self-pay | Admitting: Family Medicine

## 2010-03-22 ENCOUNTER — Telehealth: Payer: Self-pay | Admitting: Family Medicine

## 2010-03-25 ENCOUNTER — Ambulatory Visit: Payer: Self-pay | Admitting: Family Medicine

## 2010-03-25 ENCOUNTER — Telehealth: Payer: Self-pay | Admitting: Internal Medicine

## 2010-04-12 ENCOUNTER — Telehealth: Payer: Self-pay | Admitting: Family Medicine

## 2010-04-17 ENCOUNTER — Encounter: Payer: Self-pay | Admitting: Family Medicine

## 2010-05-16 ENCOUNTER — Encounter: Payer: Self-pay | Admitting: Family Medicine

## 2010-05-19 ENCOUNTER — Encounter: Payer: Self-pay | Admitting: Family Medicine

## 2010-05-22 ENCOUNTER — Telehealth: Payer: Self-pay | Admitting: Family Medicine

## 2010-05-22 ENCOUNTER — Encounter (HOSPITAL_COMMUNITY)
Admission: RE | Admit: 2010-05-22 | Discharge: 2010-06-25 | Payer: Self-pay | Source: Home / Self Care | Attending: Internal Medicine | Admitting: Internal Medicine

## 2010-05-26 HISTORY — PX: LOBECTOMY: SHX5089

## 2010-05-28 ENCOUNTER — Telehealth (INDEPENDENT_AMBULATORY_CARE_PROVIDER_SITE_OTHER): Payer: Self-pay | Admitting: *Deleted

## 2010-05-30 ENCOUNTER — Ambulatory Visit
Admission: RE | Admit: 2010-05-30 | Discharge: 2010-05-30 | Payer: Self-pay | Source: Home / Self Care | Attending: Family Medicine | Admitting: Family Medicine

## 2010-05-30 DIAGNOSIS — C3490 Malignant neoplasm of unspecified part of unspecified bronchus or lung: Secondary | ICD-10-CM | POA: Insufficient documentation

## 2010-05-30 DIAGNOSIS — R519 Headache, unspecified: Secondary | ICD-10-CM | POA: Insufficient documentation

## 2010-05-30 DIAGNOSIS — R51 Headache: Secondary | ICD-10-CM | POA: Insufficient documentation

## 2010-06-03 ENCOUNTER — Telehealth (INDEPENDENT_AMBULATORY_CARE_PROVIDER_SITE_OTHER): Payer: Self-pay | Admitting: *Deleted

## 2010-06-13 ENCOUNTER — Telehealth: Payer: Self-pay | Admitting: Family Medicine

## 2010-06-14 ENCOUNTER — Encounter (INDEPENDENT_AMBULATORY_CARE_PROVIDER_SITE_OTHER): Payer: Self-pay | Admitting: *Deleted

## 2010-06-16 ENCOUNTER — Encounter: Payer: Self-pay | Admitting: Family Medicine

## 2010-06-16 ENCOUNTER — Encounter: Payer: Self-pay | Admitting: Specialist

## 2010-06-17 ENCOUNTER — Telehealth (INDEPENDENT_AMBULATORY_CARE_PROVIDER_SITE_OTHER): Payer: Self-pay | Admitting: *Deleted

## 2010-06-17 ENCOUNTER — Ambulatory Visit: Admit: 2010-06-17 | Payer: Self-pay | Admitting: Family Medicine

## 2010-06-18 ENCOUNTER — Ambulatory Visit: Admit: 2010-06-18 | Payer: Self-pay | Admitting: Family Medicine

## 2010-06-18 ENCOUNTER — Encounter (INDEPENDENT_AMBULATORY_CARE_PROVIDER_SITE_OTHER): Payer: Self-pay | Admitting: *Deleted

## 2010-06-19 ENCOUNTER — Telehealth: Payer: Self-pay | Admitting: Family Medicine

## 2010-06-19 ENCOUNTER — Ambulatory Visit
Admission: RE | Admit: 2010-06-19 | Discharge: 2010-06-19 | Payer: Self-pay | Source: Home / Self Care | Attending: Family Medicine | Admitting: Family Medicine

## 2010-06-19 DIAGNOSIS — J209 Acute bronchitis, unspecified: Secondary | ICD-10-CM | POA: Insufficient documentation

## 2010-06-24 ENCOUNTER — Telehealth: Payer: Self-pay | Admitting: Family Medicine

## 2010-06-27 ENCOUNTER — Encounter: Payer: Self-pay | Admitting: Family Medicine
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
## 2010-06-27 NOTE — Letter (Signed)
Summary: [facility]   Imported By: Marilynne Drivers 01/15/2010 14:27:19  _____________________________________________________________________  External Attachment:    Type:   Image     Comment:   External Document
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
## 2010-06-27 NOTE — Progress Notes (Signed)
Summary: does pt need lab work?  Phone Note Call from Patient Call back at Home Phone [phone] Call back at [phone]   Caller: Spouse  [name] Summary of Call: Pt's wife is asking if you want ot check pt's lipids to see if he needs to continue on 80 mg's of lipitor.               Marty Heck CMA  December 28, 2009 9:42 AM   Follow-up for Phone Call        30 minute hospital follow-up, come in fasting in AM Follow-up by: [name] MD,  December 28, 2009 2:26 PM  Additional Follow-up for Phone Call Additional follow up Details #1::        [facility] for wife to call.           Marty Heck CMA  December 28, 2009 3:30 PM  Pt is coming in for visit today.  Will need to schedule labs. Additional Follow-up by: Marty Heck CMA,  December 31, 2009 10:28 AM

## 2010-06-27 NOTE — Progress Notes (Signed)
Summary: reaction  Phone Note Call from Patient Call back at [phone] or [phone]   Caller: [name] Call For: [name] Reason for Call: Talk to Nurse Summary of Call: Think pt is having a reaction to inhaler, Dulera.  Mouth and tongue are sore and brokeout.  Please advise Initial call taken by: Zigmund Gottron,  November 02, 2009 8:31 AM  Follow-up for Phone Call        ATC [phone], no answer no voicemail, LMTCB on other number provided.[place] Bing CMA  November 02, 2009 9:15 AM   Additional Follow-up for Phone Call Additional follow up Details #1::        PT CAN BE REACHED [id number] OR [id number] Additional Follow-up by: Gustavus Bryant,  November 05, 2009 11:22 AM    Additional Follow-up for Phone Call Additional follow up Details #2::    called spoke with patient, he described a white film in mouth, sores on his tongue and in the corners of his mouth.  asked patient if he was brushing/rinsing/gargling after each use.  pt denied doing this stating he was not aware it should be done.  pt was unable to tell of the dulera helped his breathing, stating that he only used x3-4days before stopping it thurs 11-01-09 but denies any negative changes to his breathing or anaphylactic symptoms.  may patient have majic mouthwash and should he retry the dulera?  please advise, thanks! Parke Poisson CNA/MA  November 05, 2009 5:21 PM   Additional Follow-up for Phone Call Additional follow up Details #3:: Details for Additional Follow-up Action Taken: per CY---ok for dukes magic mouthwash   #117ml  swish and swallow 1 tsp four times daily---this has been called into the pharmacy- at [facility] in [place].--and ok to resume the dulera  2 puffs before breakfast and dinner.  called and spoke with pt and he is aware.  pt voiced his understanding and will call for any further problems Elita Boone CMA  November 05, 2009 5:45 PM   New/Updated Medications: * MMW 1 tsp swish and swallow four times  daily Prescriptions: MMW 1 tsp swish and swallow four times daily  #168ml x 0   Entered by:   Elita Boone CMA   Authorized by:   Deneise Lever MD   Signed by:   Elita Boone CMA on 11/05/2009   Method used:   Telephoned to ...       [facility] (retail)       [address], [place]  [address]       Ph: [phone]       Fax: [phone]   RxID:   [id number]   Appended Document: reaction ok to stop dulera completely until f/u ov with Tammy and just use the albuterol as needed as previously rec  Appended Document: reaction Spoke with pt and advised that okay to go ahead and stop the dulera completely until he has appt with TP and use albuterol inhaler as needed.  Pt verbalized understanding.

## 2010-06-27 NOTE — Progress Notes (Signed)
Summary: lipid results and question about ASA  Phone Note Outgoing Call Call back at [facility] Phone [phone]   Call placed by: Parke Poisson CNA/MA,  January 11, 2010 11:20 AM Call placed to: spouse: Corporate treasurer Summary of Call: I spoke with patient's wife re: the lab results ordered by Rexene Edison.  a lipid panel was added to the labs we ordered, therefore the results were sent to TP.  because we do not manage pt's cholesterol, will defer this to Dr. Lillie Fragmin office to discuss those particular results.  also, [name] would like to know if patient needs to continue taking his 81mg  asa with an upcoming scalpula surgery with Dr. Louanne Skye.  will defer this to PCP as well.  Follow-up for Phone Call        should stop his aspirin minimally 5 days before operation. Dr. Louanne Skye can likely tell exactly his protocols, some surgeons will hold for 7 days  Cholesterol is still high, has he been taking his Lipitor 80 mg every day? Was he fasting when he got his labwork?  Follow-up by: [name] MD,  January 13, 2010 8:02 AM  Additional Follow-up for Phone Call Additional follow up Details #1::        Lmom for patient with results and asked to call back with more info.[place]   Additional Follow-up by: Zenda Alpers CMA Deborra Medina),  January 14, 2010 9:41 AM    Additional Follow-up for Phone Call Additional follow up Details #2::    called patient again but, patient will not return phone call Follow-up by: Zenda Alpers CMA Deborra Medina),  January 15, 2010 10:41 AM
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
## 2010-06-27 NOTE — Progress Notes (Signed)
Summary: refill request for mobic  Phone Note Refill Request Message from:  Fax from Pharmacy  Refills Requested: Medication #1:  MOBIC 15 MG TABS 1 TABLET A DAY   Last Refilled: 08/10/2009 Faxed request from [facility].  Initial call taken by: Marty Heck CMA,  September 12, 2009 2:02 PM  Follow-up for Phone Call        i sent to the wrong pharmacy  can you call then fix so goes to [facility] Follow-up by: [name] MD,  September 12, 2009 2:16 PM    Prescriptions: MOBIC 15 MG TABS (MELOXICAM) 1 TABLET A DAY  #30 x 5   Entered by:   Zenda Alpers CMA ([place])   Authorized by:   [name] MD   Signed by:   Zenda Alpers CMA ([place]) on 09/12/2009   Method used:   Electronically to        [facility] (retail)       [address]       [place], [place]  [address]       Ph: [phone]       Fax: [phone]   RxIDWE:[id number] MOBIC 15 MG TABS (MELOXICAM) 1 TABLET A DAY  #30 x 5   Entered and Authorized by:   [name] MD   Signed by:   [name] MD on 09/12/2009   Method used:   Electronically to        [facility] (retail)       [address]       [place], [place]  [address]       Ph: [phone]       Fax: [phone]   RxID:   [id number]

## 2010-06-27 NOTE — Letter (Signed)
Summary: Surgical Clearance/[facility]  Surgical Clearance/[facility]   Imported By: Rise Patience 12/13/2009 15:47:58  _____________________________________________________________________  External Attachment:    Type:   Image     Comment:   External Document

## 2010-06-27 NOTE — Progress Notes (Signed)
----   Converted from flag ---- ---- 03/23/2009 11:48 AM, Tanda Rockers MD wrote: needs ov with cxr by now ------------------------------  Pt had rov with cxr on 06/07/09.   Tilden Dome  June 18, 2009 9:56 AM
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
## 2010-06-27 NOTE — Progress Notes (Signed)
Phone Note Outgoing Call   Summary of Call: Examining brother, patient dx with lung cancer. Eduard Clos) at [facility] -- significant coughing for a week.  Want to cover with ABX.  Initial call taken by: [name] MD,  May 22, 2010 4:55 PM    New/Updated Medications: DOXYCYCLINE HYCLATE 100 MG CAPS (DOXYCYCLINE HYCLATE) Take 1 tab twice a day Prescriptions: DOXYCYCLINE HYCLATE 100 MG CAPS (DOXYCYCLINE HYCLATE) Take 1 tab twice a day  #20 x 0   Entered and Authorized by:   [name] MD   Signed by:   [name] MD on 05/22/2010   Method used:   Electronically to        [facility] (retail)       [address]       [place], [place]  [address]       Ph: [phone]       Fax: [phone]   [id number]   Appended Document:     Clinical Lists Changes  Medications: Rx of TRAZODONE HCL 100 MG TABS (TRAZODONE HCL) Take 1 tab by mouth at bedtime;  #30 x 11;  Signed;  Entered by: [name] MD;  Authorized by: [name] MD;  Method used: Electronically to [facility]*, [address], [place], [place]  [address], Ph: [phone], Fax: [phone] Rx of GABAPENTIN 300 MG CAPS (GABAPENTIN) take 3 capsules by mouth at bedtime;  #90 x 11;  Signed;  Entered by: [name] MD;  Authorized by: [name] MD;  Method used: Electronically to [facility]*, [address] [place], [place]  [address], Ph: [phone], Fax: [phone] Rx of OMEPRAZOLE 20 MG TBEC (OMEPRAZOLE) Take 1 tablet by mouth once a day 67minutes before breakfast;  #30 x 11;  Signed;  Entered by: [name] MD;  Authorized by: [name] MD;  Method used: Electronically to [facility]*, [address] [place], [place]  [address], Ph: [phone], Fax: [phone]    Prescriptions: OMEPRAZOLE 20 MG TBEC (OMEPRAZOLE) Take 1 tablet by mouth once a day 33minutes before breakfast  #30 x 11   Entered and Authorized by:   [name] MD   Signed by:   [name] MD on 05/22/2010   Method used:   Electronically to        [facility] (retail)       [address]       [place], [place]  [address]       Ph: [phone]       Fax: [phone]   RxIDYH:[id number] GABAPENTIN 300 MG CAPS (GABAPENTIN) take 3 capsules by mouth at bedtime  #90 x 11   Entered and Authorized by:   [name] MD   Signed by:   [name] MD on 05/22/2010   Method used:   Electronically to        [facility] (retail)       [address]       [place], [place]  [address]       Ph: [phone]       Fax: [phone]   [id number] TRAZODONE HCL 100 MG TABS (TRAZODONE HCL) Take 1 tab by mouth at bedtime  #30 x 11   Entered and Authorized by:   [name] MD   Signed by:   [name] MD on 05/22/2010   Method used:   Electronically to        [facility] (retail)       [address]       [place], [place]  [address]       Ph: [phone]       Fax: [phone]   [id number]

## 2010-06-27 NOTE — Progress Notes (Signed)
Summary: prescription  Phone Note Call from Patient Call back at [phone]   Caller: Spouse//elizabeth Call For: wert Summary of Call: Need rx for symbicort 160-4.5 micrograms//[facility]  Initial call taken by: Netta Neat,  Oct 01, 2009 1:46 PM  Follow-up for Phone Call        Spoke with pt's and advised that rx has been sent to pharm.   Tilden Dome  Oct 01, 2009 2:13 PM     Prescriptions: SYMBICORT 160-4.5 MCG/ACT  AERO (BUDESONIDE-FORMOTEROL FUMARATE) 2 puffs first thing  in am and 2 puffs again in pm about 12 hours later  #1 x 11   Entered by:   Tilden Dome   Authorized by:   Tanda Rockers MD   Signed by:   Tilden Dome on 10/01/2009   Method used:   Electronically to        [facility] (retail)       [address]       [place], [place]  [address]       Ph: [phone]       Fax: [phone]   [id number]

## 2010-06-27 NOTE — Letter (Signed)
Summary: [facility]  [facility]   Imported By: Edmonia James 05/28/2010 11:03:37  _____________________________________________________________________  External Attachment:    Type:   Image     Comment:   External Document  Appended Document: [facility]  Past Medical History:    A. Flutter s/p ablation by Dr. Lovena Le    Lung Cancer (Right, [facility], 04/2010, bx proven)    CAD, ARTERY BYPASS GRAFT (ICD-414.04)    --CABG 2008 f/by Dr. Verl Blalock.     HYPERCHOLESTEROLEMIA (ICD-272.0)    HYPERTENSION (ICD-401.9)    CT, CHEST, ABNORMAL (ICD-793.1)        - 12/28/2008 LUL scar /minimal RLL rn changes    COPD (ICD-496)minimal detected .....................................................................Marland KitchenWert    HEPATITIS (ICD-573.3)    GERD (ICD-530.81)    ARTHRITIS (ICD-716.90)      Right lung cancer   Clinical Lists Changes  Observations: Added new observation of PAST MED HX: A. Flutter s/p ablation by Dr. Lovena Le Lung Cancer (Right, [facility], 04/2010, bx proven) CAD, ARTERY BYPASS GRAFT (ICD-414.04) --CABG 2008 f/by Dr. Verl Blalock.  HYPERCHOLESTEROLEMIA (ICD-272.0) HYPERTENSION (ICD-401.9) CT, CHEST, ABNORMAL (ICD-793.1)     - 12/28/2008 LUL scar /minimal RLL rn changes COPD (ICD-496)minimal detected .....................................................................Marland KitchenWert HEPATITIS (ICD-573.3) GERD (ICD-530.81) ARTHRITIS (ICD-716.90)    (05/30/2010 9:37)

## 2010-06-27 NOTE — Progress Notes (Signed)
Summary: pt wants to try medicine  Phone Note Call from Patient   Caller: Spouse Call For: [name] MD Summary of Call: Advised pts wife of his lab results.  She says he does want to try either cialis or levitra.  [facility]. Initial call taken by: Marty Heck CMA,  November 20, 2009 9:26 AM  Follow-up for Phone Call        levitra 20 mg, 1 by mouth 30 minutes prior to intercourse, #10, 11 refills Follow-up by: [name] MD,  November 20, 2009 10:47 AM    New/Updated Medications: LEVITRA 20 MG TABS (VARDENAFIL HCL) 1 once daily as needed 30 mins before intercourse. Prescriptions: LEVITRA 20 MG TABS (VARDENAFIL HCL) 1 once daily as needed 30 mins before intercourse.  #10 x 11   Entered and Authorized by:   [name] MD   Signed by:   [name] MD on 11/20/2009   Method used:   Electronically to        [facility] (retail)       [address]       [place], [place]  [address]       Ph: [phone]       Fax: [phone]   RxIDJK:[id number]

## 2010-06-27 NOTE — Progress Notes (Signed)
Summary: wants cholesterol checked  Phone Note Call from Patient Call back at [phone]   Caller: Spouse Call For: [name] MD Summary of Call: Pt is asking if he can have cholesterol checked, taking 80 mg's of lipitor now.  He is going to [facility] for other labs and would like to have it drawn then. Initial call taken by: Marty Heck CMA,  January 09, 2010 10:17 AM  Follow-up for Phone Call        FLP, 272.4  I am not sure how to order if he is going to [place], Karna Christmas, can you help? Follow-up by: [name] MD,  January 09, 2010 4:47 PM  Additional Follow-up for Phone Call Additional follow up Details #1::        I added it to his [facility] appt, Notified patient also. Additional Follow-up by: Selinda Orion,  January 09, 2010 5:07 PM

## 2010-06-27 NOTE — Progress Notes (Signed)
Summary: rx req/ congestion  Phone Note Call from Patient Call back at Home Phone [phone]   Caller: Spouse-[name] Call For: wert Summary of Call: per spouse- pt c/o of cough w/ thick yellow mucus x 3 days. denies fever. no N or V. has used guaisenesin and his inhalers. request an abx called in to [facility] in [place]. spouse # at work [phone] or home # soon.  Initial call taken by: Cooper Render, CNA,  Oct 17, 2009 10:10 AM  Follow-up for Phone Call        MW pt--per pt spouse pt has chest congestion and productive cough with yellow phlegm x 3 days. Denies any head congestion or fever. Some chest tightness. Pt has been taking guafenisen without reflief. Advised MW out of office but I will forward todoc-of-day.  Please advise. [place] Bing CMA  Oct 17, 2009 10:43 AM NKDA  Additional Follow-up for Phone Call Additional follow up Details #1::        ok to call in omnicef 300mg  2 each am for 5 days, no fills.  needs apptm with wert for f/u  Additional Follow-up by: Kathee Delton MD,  Oct 17, 2009 5:26 PM    Additional Follow-up for Phone Call Additional follow up Details #2::    rx sent. pt aware.Paisley Bing CMA  Oct 17, 2009 5:30 PM   New/Updated Medications: CEFDINIR 300 MG CAPS (CEFDINIR) Take 1 tablet by mouth two times a day Prescriptions: CEFDINIR 300 MG CAPS (CEFDINIR) Take 1 tablet by mouth two times a day  #10 x 0   Entered by:   [place] Bing CMA   Authorized by:   Kathee Delton MD   Signed by:   [place] Bing CMA on 10/17/2009   Method used:   Electronically to        [facility] (retail)       [address]       [place], [place]  [address]       Ph: [phone]       Fax: [phone]   [id number]:[id number]
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
## 2010-06-27 NOTE — Progress Notes (Signed)
Summary: xanax is not helping  Phone Note Call from Patient Call back at Home Phone [phone]   Caller: Patient Call For: [name] MD Summary of Call: Patient says that the xanax is not helping him. He feels like he needs something stronger. He is asking if he can either try something different or increase the xanax. Please advise. Uses [facility].  Initial call taken by: Lacretia Nicks,  June 03, 2010 2:56 PM  Follow-up for Phone Call        change to klonopin two times a day scheduled.  this is very good anxiety / panic attack medicine.  Follow-up by: [name] MD,  June 03, 2010 2:59 PM  Additional Follow-up for Phone Call Additional follow up Details #1::        patient advised and rx called in.Cathlean Cower CMA   Additional Follow-up by: Zenda Alpers CMA ([place]),  June 03, 2010 3:20 PM    New/Updated Medications: CLONAZEPAM 0.5 MG TABS (CLONAZEPAM) 1 by mouth two times a day Prescriptions: CLONAZEPAM 0.5 MG TABS (CLONAZEPAM) 1 by mouth two times a day  #60 x 1   Entered and Authorized by:   [name] MD   Signed by:   [name] MD on 06/03/2010   Method used:   Telephoned to ...       [facility] (retail)       [address]       [place], [place]  [address]       Ph: [phone]       Fax: [phone]   RxID:   [id number]

## 2010-06-27 NOTE — Progress Notes (Signed)
Summary: Question about med  Phone Note Outgoing Call   Call placed by: Edwin Dada CMA Deborra Medina),  May 28, 2010 3:56 PM Call placed to: Patient/ wife Summary of Call: fax request from [facility] asking if pt should be taking Omeprazole? per pharmacist wife states pt is not on med. Wife states medication was put on wrong chart, states it's his brother that needs the refill. I advised pharmacist that Tobie Lords rx for Omeprazole was sent on 04/29/2010. Please advise. Initial call taken by: Edwin Dada CMA Deborra Medina),  May 28, 2010 4:01 PM  Follow-up for Phone Call        tried calling home number, no answer and no answering machine, will try again later. Tina Deborra Medina)  May 28, 2010 3:58 PM   Additional Follow-up for Phone Call Additional follow up Details #1::        i believe this was done in error.   Phillip and his wife are savvy - if he does not take prilosec, then that is correct.  ok to call [facility] to confirm.   I suspect that I inadvertantly sent for [place] as opposed to [place].  And let Eduard Clos or [name] know. Additional Follow-up by: [name] MD,  May 28, 2010 4:15 PM    Additional Follow-up for Phone Call Additional follow up Details #2::    Called patient and got no answer but, answering machine with not let you leave message.[place]    Patient will not return phone call will wait on patient to return may call.Cathlean Cower CMA   Follow-up by: Zenda Alpers CMA Deborra Medina),  May 30, 2010 7:52 AM

## 2010-06-27 NOTE — Progress Notes (Signed)
Summary: nos appt  Phone Note Call from Patient   Caller: [email or web address]  Call For: Jes Costales Summary of Call: In ref to nos from 7/8, pt is in [place]. 1523, admitted 7/5. Initial call taken by: Netta Neat,  December 03, 2009 9:33 AM

## 2010-06-27 NOTE — Medication Information (Signed)
Summary: [facility]  [facility]   Imported By: Edmonia James 01/07/2010 10:49:47  _____________________________________________________________________  External Attachment:    Type:   Image     Comment:   External Document

## 2010-06-27 NOTE — Progress Notes (Signed)
Summary: hfu  Phone Note Call from Patient   Caller: Spouse Call For: wert Summary of Call: leslie, pt's pt rsc'd pt's HFU from 7/29 to 8/3. i put him in a cpx spot at 9am. if you need me to put a "hold" on the 8:45 time, let me know. thanks.  Initial call taken by: Cooper Render, CNA,  December 19, 2009 9:27 AM  Follow-up for Phone Call        No need to hold the 8:45 am slot [place]  December 19, 2009 10:57 AM

## 2010-06-27 NOTE — Progress Notes (Signed)
Summary: wants antibiotic   Phone Note Call from Patient Call back at Home Phone [phone]   Caller: Patient Call For: [name] MD Summary of Call: Patient's says that he feels like he has pneumonia, he is coughing up yellow stuff, wheezing, fever 101.1. Patient just now called at 4:51 he is asking if he could get an antibiotic called in. I told wife that he would need an appt. possibly [facility]. [facility] if needed.  Initial call taken by: Lacretia Nicks,  April 12, 2010 4:54 PM  Follow-up for Phone Call        If patient is worried about PNA, then he needs eval.  If short of breath, then proceed to ER.  Follow-up by: Elsie Stain MD,  April 12, 2010 5:07 PM  Additional Follow-up for Phone Call Additional follow up Details #1::        Spoke with patient's wife, she is going to take him to ER because patient wants soem relief tonight.  Lacretia Nicks  April 12, 2010 5:09 PM     Additional Follow-up for Phone Call Additional follow up Details #2::    noted.  Follow-up by: Elsie Stain MD,  April 12, 2010 5:20 PM

## 2010-06-27 NOTE — Letter (Signed)
Summary: [facility] Surgical Clear[facility]   [facility] Surgical Clearance   Imported By: Sallee Provencal 12/31/2009 11:30:58  _____________________________________________________________________  External Attachment:    Type:   Image     Comment:   External Document

## 2010-06-27 NOTE — Progress Notes (Signed)
Summary: results  Phone Note Call from Patient Call back at [phone]   Caller: Patient Call For: wert Summary of Call: calling for lab results Initial call taken by: Gustavus Bryant,  December 28, 2009 9:35 AM  Follow-up for Phone Call        [facility] [place]  December 28, 2009 9:42 AM  Spoke with pt and notifed results/recs per MW.  Pt verbalized understanding. Follow-up by: [place] D[place],  December 28, 2009 11:13 AM

## 2010-06-27 NOTE — Assessment & Plan Note (Signed)
Summary: Pulmonary/ acute ext ov with hfa 75%    Copy to:  Dr. Basil Dess Primary Provider/Referring Provider:  [name] MD  CC:  Acute visit.  Pt c/o prod cough with green/yellow sptutum x 3-4 days.  Also c/o wheezing "all the time"- worsens when lies down.  Also has had incresed SOB.  [name]  History of Present Illness: 72 yowm quit smoking 05/2009 informed he had emphysema in 2007 sent to Pulmonary for eval of abn ct Chest done 12/28/08.  January 12, 2009 initial pulmonary eval  c/o sob that has worsened  gradually since winter of 2010 to point where point where can still do grocery shopping ok slow pace with handicap parking,  steps x one flight but stop at top to catch breath,    assoc with cough some better with  amoxcillin which turned the mucus from green to white but getting back yellow now esp in am.  In term of doe  advair help some not proaire - spiriva did not help at all.   rec symbicort and as needed combivent  February 13, 2009 ov some better, not using combivent much, still smoking actively but thinking about quitting.  less cough, still sob > slow adl's  March 23, 2009 ov 6 wk followup with cxr and pft's.  Pt states that his breathing is about the same.  States that after he uses symbicort he coughs up black colored sputum but what he coughed up here is very dark yellow, worst in am and at hs.    June 07, 2009 3 month followup with cxr.  Pt states that his breathing is about the same- no better or worse.  He states that he woke up with rt side cp about 1 wk ago.  Has not noticed any pain since then. stopped smoking since last ov and not consistent with symbicort.  rec work on hfa, maintain off cigs  August 22, 2009 Acute visit. never got any better but not smoking Pt c/o prod cough with green/yellow sptutum x 3-4 days.  Also c/o wheezing "all the time"- worsens when lies down.  Also has had incresed SOB.  Pt denies any significant sore throat, dysphagia, itching, sneezing,   nasal congestion or excess secretions,  fever, chills, sweats, unintended wt loss, pleuritic or exertional cp, hempoptysis, change in activity tolerance  orthopnea pnd or leg swelling. ' Current Medications (verified): 1)  Nitroglycerin 0.4 Mg Subl (Nitroglycerin) .... Place 1 Tablet Under Tongue As Directed 2)  Toprol Xl 25 Mg Xr24h-Tab (Metoprolol Succinate) .... Take 1/2 Tablet By Mouth Once A Day 3)  Amphetamine Salt Er 20mg  .... Takes 1 Tab 2 Times A Day 4)  Aspir-Low 81 Mg Tbec (Aspirin) .... One A Day 5)  Mobic 15 Mg Tabs (Meloxicam) .[name].. 1 Tablet A Day 6)  Lovastatin 40 Mg Tabs (Lovastatin) .[name].. 1 Tab A Day 7)  Trazodone Hcl 100 Mg Tabs (Trazodone Hcl) .[name].. 1 Tablet A Day 8)  Gabapentin 300 Mg Caps (Gabapentin) .... Take One Tablet Twice A Day 9)  Percocet 10-325 Mg Tabs (Oxycodone-Acetaminophen) .... Takes 1 Pill 3-4 Times A Day 10)  Proair Hfa 108 (90 Base) Mcg/act Aers (Albuterol Sulfate) .... As Needed 11)  Symbicort 80-4.5 Mcg/act Aero (Budesonide-Formoterol Fumarate) .... Use As Directed 12)  Opana Er 30 Mg Xr12h-Tab (Oxymorphone Hcl) .[name].. 1 Tab Two Times A Day  Allergies (verified): No Known Drug Allergies  Past History:  Past Medical History: A. Flutter s/p ablation by Dr. Lovena Le  CAD, ARTERY BYPASS GRAFT (ICD-414.04) HYPERCHOLESTEROLEMIA (ICD-272.0) HYPERTENSION (ICD-401.9) SMOKER (ICD-305.1) FATIGUE (ICD-780.79) CT, CHEST, ABNORMAL (ICD-793.1) COPD (ICD-496) .........................................................................................Marland KitchenWert     - PFT's March 23, 2009 only abn mid flows     - HFA 75% March 23, 2009 > 75% August 22, 2009  COUGH (ICD-786.2) SPECIAL SCREENING MALIGNANT NEOPLASM OF PROSTATE (ICD-V76.44) SCREENING, COLON CANCER (ICD-V76.51) WEIGHT LOSS (ICD-783.21) HEMOPTYSIS (ICD-786.3) FAMILY HISTORY DIABETES 1ST DEGREE RELATIVE (ICD-V18.0) HEPATITIS (ICD-573.3) GERD (ICD-530.81) ARTHRITIS (ICD-716.90)    Vital Signs:  Patient  profile:   60 year old male Weight:      156.25 pounds O2 Sat:      96 % on Room air Temp:     97.7 degrees F oral Pulse rate:   66 / minute BP sitting:   110 / 68  (left arm)  Vitals Entered By: Tilden Dome (August 22, 2009 12:00 PM)  O2 Flow:  Room air  Physical Exam  Additional Exam:  wt 147 > 151 February 13, 2009 >  158 June 07, 2009 > 156 August 22, 2009  amb slt hoarse amb wm nad  HEENT mild turbinate edema.  Oropharynx edentulous, no thrush or excess pnd or cobblestoning.  No JVD or cervical adenopathy. Mild accessory muscle hypertrophy. Trachea midline, nl thryroid. Chest was hyperinflated by percussion with diminished breath sounds and moderate increased exp time without wheeze. Hoover sign positive at mid inspiration. Regular rate and rhythm without murmur gallop or rub or increase P2 or edema.  Abd: no hsm, nl excursion. Ext warm without cyanosis or clubbing.     Impression & Recommendations:  Problem # 1:  COPD (ICD-496) DDX of  difficult airways managment all start with A and  include Adherence, Ace Inhibitors, Acid Reflux, Active Sinus Disease, Alpha 1 Antitripsin deficiency, Anxiety masquerading as Airways dz,  ABPA,  allergy(esp in young), Aspiration (esp in elderly), Adverse effects of DPI,  Active smokers, plus one B  = Beta blocker use..    Adherence a major issue. Acid Reflux also a concern as is sinus dz based on hoarseness.  I had an extended discussion with the patient and wife  today lasting 15 to 20 minutes of a 25 minute visit on the following issues:   Each maintenance medication was reviewed in detail including most importantly the difference between maintenance and as needed and under what circumstances the prns are to be used. I spent extra time with the patient today explaining optimal mdi  technique.  This improved from  50-75%  Medications Added to Medication List This Visit: 1)  Symbicort 160-4.5 Mcg/act Aero (Budesonide-formoterol fumarate) ....  2 puffs first thing  in am and 2 puffs again in pm about 12 hours later 2)  Prednisone 10 Mg Tabs (Prednisone) .... 4 each am x 2days, 2x2days, 1x2days and stop 3)  Doxycycline Monohydrate 100 Mg Caps (Doxycycline monohydrate) .... By mouth twice daily x 7days with water  Other Orders: Est. Patient Level IV [id number])  Patient Instructions: 1)  Start symbicort 160 2 puffs first thing  in am and 2 puffs again in pm about 12 hours later 2)  Work on inhaler technique:  relax and blow all the way out then take a nice smooth deep breath back in, triggering the inhaler at same time you start breathing in 3)  If the symbicort is working you should notice better breathing and much need for the proaire and you refill - if not satisfied don't fill it but return here 4)  Prednisone  x 6days and Doxy x 7 days 5)  If still coughing take prilosec otc Take one 30-60 min before first and last meals of the day for at least two weeks 6)  GERD (REFLUX)  is a common cause of respiratory symptoms. It commonly presents without heartburn and can be treated with medication, but also with lifestyle changes including avoidance of late meals, excessive alcohol, smoking cessation, and avoid fatty foods, chocolate, peppermint, colas, red wine, and acidic juices such as orange juice. NO MINT OR MENTHOL PRODUCTS SO NO COUGH DROPS  7)  USE SUGARLESS CANDY INSTEAD (jolley ranchers)  8)  NO OIL BASED VITAMINS  Prescriptions: DOXYCYCLINE MONOHYDRATE 100 MG  CAPS (DOXYCYCLINE MONOHYDRATE) By mouth twice daily x 7days with water  #14 x 0   Entered and Authorized by:   Tanda Rockers MD   Signed by:   Tanda Rockers MD on 08/22/2009   Method used:   Electronically to        [facility] (retail)       [address]       [place], [place]  [address]       Ph: [phone]       Fax: [phone]   RxIDED:[id number] PREDNISONE 10 MG  TABS (PREDNISONE) 4 each am x 2days, 2x2days, 1x2days and stop  #14 x 0   Entered and  Authorized by:   Tanda Rockers MD   Signed by:   Tanda Rockers MD on 08/22/2009   Method used:   Electronically to        [facility] (retail)       [address]       [place], [place]  [address]       Ph: [phone]       Fax: [phone]   RxIDTA:[id number] [place] 160-4.5 MCG/ACT  AERO (BUDESONIDE-FORMOTEROL FUMARATE) 2 puffs first thing  in am and 2 puffs again in pm about 12 hours later  #1 x 11   Entered and Authorized by:   Tanda Rockers MD   Signed by:   Tanda Rockers MD on 08/22/2009   Method used:   Print then Give to Patient   RxID:   [id number]

## 2010-06-27 NOTE — Progress Notes (Signed)
Summary: Needs full sinus ct then f/u with Tammy NP  Phone Note Call from Patient Call back at Home Phone [phone]   Caller: Patient Call For: Wert/Wright Summary of Call: patient would like to switch from Dr. Gustavus Bryant care to Dr. Bettina Gavia.  please advise if this is okay, thanks!  note: patient seen by TP today 8.16.11 - told to follow up in 2 weeks w/ cxr.  will need this scheduled when change is made. Initial call taken by: Parke Poisson CNA/MA,  January 08, 2010 3:38 PM  Follow-up for Phone Call        this is fine with me but the purpose of the visit 8/16 with Tammy NP was to do a full med reconciliation  where his pill box was lined up against his meds and his med calendar - this needs to be done correctly before asking DrWright or any of the pulmonary doctors to see him and was already explained to pt/ wife and Dr Edilia Bo on his previous visit with me.   Needs also at least a sinus ct as next step in w/u if not done recently send final recs of whatever we decide to attn of dr Copland also he does not have sign copd by the pft's we have on file Follow-up by: Tanda Rockers MD,  January 08, 2010 3:40 PM  Additional Follow-up for Phone Call Additional follow up Details #1::        I would prefer another pulmonary MD see this patient  Additional Follow-up by: Elsie Stain MD,  January 08, 2010 5:00 PM    Additional Follow-up for Phone Call Additional follow up Details #2::    LMTCB.Clayborne Dana CMA  January 08, 2010 5:15 PM   [facility] Raymondo Band RN  January 09, 2010 8:52 AM  Spoke with pt's wife.  She is aware of above and requesting to switch to Dr. Gwenette Greet.  Dr. Gwenette Greet, pls advise if this is ok with you.  Thanks! Raymondo Band RN  January 09, 2010 9:44 AM   Additional Follow-up for Phone Call Additional follow up Details #3:: Details for Additional Follow-up Action Taken: given the pt's extensive h/o noncompliance and not sticking with a plan, and also the fact he really  doesn't have significant disease on pfts, I would rather not.  I would recommend that he stick with Dr. Melvyn Novas thru a plan, and suspect he will get better if he does so. Additional Follow-up by: Kathee Delton MD,  January 09, 2010 5:15 PM  The patient is willing to stay with Dr. Melvyn Novas at this time and says he has not had a sinus ct. Okay to send order for this to be done?Francesca Jewett [facility]  January 09, 2010 5:27 PM ok, needs full coronal ct sinus and f/u with See Tammy NP  with all  medications, even over the counter meds, separated in two separate bags, the ones you take no matter what vs the ones you stop once you feel better and take only as needed. Tammy can decide at that point whether to refer to ENT,  back to me or to Dr Lorelei Pont Tanda Rockers MD  January 09, 2010 6:23 PM     Agree with above, reasonable. Appreciate the assistance. I know family well.Spencer Copland MD  January 10, 2010 8:07 AM   lmom for pt or his wife to call back and ask for triage so we can make him aware that the ct sinus has been ordered  and someone will be contacting him about this as well as making him appt to see tammy parrett in 2 wks for a med cal. and explaining to bring all meds in 2 bags-1 bag with daily meds and 1 bag with everything else he takes on and off as needed   [place]  January 10, 2010 10:38 AM   received call from Walker who had pt's wife on the phone from where she advised her of pt's upcoming sinus CT on 8.25.11 @ 1130.  call transferred to me to discuss pt's lab results.  pt and wife aware of results/recs as stated by TP.  med cal appt scheduled with TP on 9.6.11 @ 1130.  pt and wife aware to bring all meds, prescription AND otc. Parke Poisson CNA/MA  January 11, 2010 11:15 AM

## 2010-06-27 NOTE — Progress Notes (Signed)
Summary: Change Doctors Requested  Phone Note Call from Patient   Summary of Call: While pt in office today, he was requesting to change from Dr. Melvyn Novas to Sylvie Farrier.  Advised pt this can be a lengthy process, to go ahead and schedule OV with MW in 1 wk per PW recs and in the meantime I would inform both Dr. Melvyn Novas and Joya Gaskins of pt's request.  He verbalized understanding and agreed to this.   Will forward message to Dr. Melvyn Novas, pls advise if you are ok for pt to switch to Dr. Joya Gaskins.  Thanks. Initial call taken by: Raymondo Band RN,  December 10, 2009 4:59 PM  Follow-up for Phone Call        I would be ok but Dr Joya Gaskins and I have already discussed this and he is not accepting new pts at this point - I'll see if one of the other pulmonary doctors will take over after his post hops f/u ov with me or Tammy NP and all meds in hand Follow-up by: Tanda Rockers MD,  December 10, 2009 5:22 PM  Additional Follow-up for Phone Call Additional follow up Details #1::        lmomtcb for pt to return call. Elita Boone CMA  December 10, 2009 5:29 PM   Spoke with pt and advised of the above recs per MW.  Pt verbalized understanding.  Will keep appt on 7/29 to discuss this further.   Additional Follow-up by: Tilden Dome,  December 11, 2009 11:19 AM

## 2010-06-27 NOTE — Progress Notes (Signed)
Summary: Labs  Phone Note Call from Patient   Caller: Pt's wife Call For: Parrett Summary of Call: While on the phone with pt's wife re: switching pulmonary MD's she stated TP mentioned pt needed to have labs done but this wasn't done.  Nothing in OV note regarding labs.  Tammy, pls advise if this was supposed to have labs.  Thanks!  ** can call pt back on his cell number [phone] Initial call taken by: Raymondo Band RN,  January 09, 2010 9:49 AM  Follow-up for Phone Call        bnp, esr and bmet  Follow-up by: Rexene Edison NP,  January 09, 2010 9:57 AM  Additional Follow-up for Phone Call Additional follow up Details #1::        Pt's spouse aware and knows labs are in system-will hold for 5 days only.Clayborne Dana CMA  January 09, 2010 10:12 AM
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
## 2010-06-27 NOTE — Progress Notes (Signed)
Summary: refill request for mobic  Phone Note Refill Request Message from:  Fax from Pharmacy  Refills Requested: Medication #1:  meloxicam 15 mg Faxed request from [facility], this is no longer on med list.   [phone]  Initial call taken by: Marty Heck CMA, AAMA,  June 13, 2010 9:42 AM    New/Updated Medications: MELOXICAM 15 MG TABS (MELOXICAM) one by mouth daily Prescriptions: MELOXICAM 15 MG TABS (MELOXICAM) one by mouth daily  #90 x 3   Entered and Authorized by:   [name] MD   Signed by:   [name] MD on 06/13/2010   Method used:   Electronically to        [facility] (retail)       [address]       [place], [place]  [address]       Ph: [phone]       Fax: [phone]   RxID:   [id number]

## 2010-06-27 NOTE — Progress Notes (Signed)
Summary: nos appt  Phone Note Call from Patient   Caller: [email or web address]  Call For: Arta Stump Summary of Call: LMTCB x2 to rsc nos from 10/28. Initial call taken by: Netta Neat,  March 25, 2010 3:44 PM     Appended Document: nos appt I saw this note and saw the patient today - he had asked our office to set up a consult at [facility] for him to get another opinion.

## 2010-06-27 NOTE — Letter (Signed)
Summary: Surgical Clearance/[facility]  Surgical Clearance/[facility]   Imported By: Edmonia James 12/12/2009 13:39:41  _____________________________________________________________________  External Attachment:    Type:   Image     Comment:   External Document

## 2010-06-27 NOTE — Consult Note (Signed)
Summary: [facility]  [facility]   Imported By: Edmonia James 03/27/2010 10:39:56  _____________________________________________________________________  External Attachment:    Type:   Image     Comment:   External Document
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
## 2010-06-27 NOTE — Progress Notes (Signed)
Summary: refill request for alprazolam  Phone Note Refill Request Message from:  Fax from Pharmacy  Refills Requested: Medication #1:  alprazolam 0.5 mg's   Last Refilled: 12/10/2009 Faxed request from [facility], this is not on pt's med list.  Initial call taken by: Marty Heck CMA,  January 11, 2010 8:50 AM  Follow-up for Phone Call        declined.  patient already on many sedating medications, and I do not want to add an additional one chronically. Follow-up by: [name] MD,  January 11, 2010 10:15 AM  Additional Follow-up for Phone Call Additional follow up Details #1::        Patient advised.Cathlean Cower CMA   Additional Follow-up by: Zenda Alpers CMA Deborra Medina),  January 11, 2010 11:04 AM

## 2010-06-27 NOTE — Medication Information (Signed)
Summary: New Rx for Atorvastatin  New Rx for Atorvastatin   Imported By: Laural Benes 05/28/2010 14:38:03  _____________________________________________________________________  External Attachment:    Type:   Image     Comment:   External Document  Appended Document: New Rx for Atorvastatin make sure this is added to his med list.  Appended Document: New Rx for Atorvastatin done
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
## 2010-06-27 NOTE — Progress Notes (Signed)
Summary: rx_  Phone Note Call from Patient Call back at Home Phone [phone] Call back at [phone]   Caller: Spouse-Elizabeth Call For: Jamiria Langill Reason for Call: Talk to Nurse Summary of Call: bad cough, 3-4 days, nasty green plegm, chest congestion, can you call something in?   [place] Initial call taken by: Zigmund Gottron,  August 21, 2009 11:41 AM  Follow-up for Phone Call        [facility]. [place] Bing CMA  August 21, 2009 12:08 PM per pt wife pt c/o prdoductive cough with green phlegm,chest congestion x 3-4  days. Pt request abx and cough medication please advise. [place] Bing CMA  August 22, 2009 9:39 AM    Additional Follow-up for Phone Call Additional follow up Details #1::        needs ov thanks- Zian Mohamed or TP Tilden Dome  August 22, 2009 9:51 AM  pt schedueld to see MW today at 11:55. [place] Bing CMA  August 22, 2009 9:58 AM

## 2010-06-27 NOTE — Progress Notes (Signed)
Summary: nos appt  Phone Note Call from Patient   Caller: [email or web address]  Call For: parrett Summary of Call: LMTCB x2 to rsc nos from 9/6. Initial call taken by: Netta Neat,  January 30, 2010 2:33 PM

## 2010-06-27 NOTE — Assessment & Plan Note (Signed)
Summary: Pulmonary/  summary  fu copd and lul    Copy to:  Dr. Basil Dess Primary Provider/Referring Provider:  [name] MD  CC:  3 month followup with cxr.  Pt states that his breathing is about the same- no better or worse.  He states that he woke up with rt side cp about 1 wk ago.  Has not noticed any pain since then..  History of Present Illness: 58 yowm smoker informed he had emphysema in 2007 sent to Pulmonary for eval of abn ct Chest done 12/28/08.  January 12, 2009 initial pulmonary eval  c/o sob that has worsened  gradually since winter of 2010 to point where point where can still do grocery shopping ok slow pace with handicap parking,  steps x one flight but stop at top to catch breath,    assoc with cough some better with  amoxcillin which turned the mucus from green to white but getting back yellow now esp in am.  In term of doe  advair help some not proaire - spiriva did not help at all.   rec symbicort and as needed combivent  February 13, 2009 ov some better, not using combivent much, still smoking actively but thinking about quitting.  less cough, still sob > slow adl's  March 23, 2009 ov 6 wk followup with cxr and pft's.  Pt states that his breathing is about the same.  States that after he uses symbicort he coughs up black colored sputum but what he coughed up here is very dark yellow, worst in am and at hs.    June 07, 2009 3 month followup with cxr.  Pt states that his breathing is about the same- no better or worse.  He states that he woke up with rt side cp about 1 wk ago.  Has not noticed any pain since then. stopped smoking since last ov and not consistent with symbicort. Pt denies any significant sore throat, dysphagia, itching, sneezing,  nasal congestion or excess secretions,  fever, chills, sweats, unintended wt loss, pleuritic or exertional cp, hempoptysis, change in activity tolerance  orthopnea pnd or leg swelling      Current Medications (verified): 1)   Nitroglycerin 0.4 Mg Subl (Nitroglycerin) .... Place 1 Tablet Under Tongue As Directed 2)  Toprol Xl 25 Mg Xr24h-Tab (Metoprolol Succinate) .... Take 1/2 Tablet By Mouth Once A Day 3)  Amphetamine Salt Er 20mg  .... Takes 1 Tab 2 Times A Day 4)  Aspir-Low 81 Mg Tbec (Aspirin) .... One A Day 5)  Mobic 15 Mg Tabs (Meloxicam) .[name].. 1 Tablet A Day 6)  Lovastatin 40 Mg Tabs (Lovastatin) .[name].. 1 Tab A Day 7)  Trazodone Hcl 100 Mg Tabs (Trazodone Hcl) .[name].. 1 Tablet A Day 8)  Gabapentin 300 Mg Caps (Gabapentin) .... Take One Tablet Twice A Day 9)  Percocet 10-325 Mg Tabs (Oxycodone-Acetaminophen) .... Takes 1 Pill 3-4 Times A Day 10)  Proair Hfa 108 (90 Base) Mcg/act Aers (Albuterol Sulfate) .... As Needed 11)  Symbicort 80-4.5 Mcg/act Aero (Budesonide-Formoterol Fumarate) .... Use As Directed 12)  Opana Er 30 Mg Xr12h-Tab (Oxymorphone Hcl) .[name].. 1 Tab Two Times A Day  Allergies (verified): No Known Drug Allergies  Past History:  Past Medical History: A. Flutter s/p ablation by Dr. Lovena Le CAD, ARTERY BYPASS GRAFT (ICD-414.04) HYPERCHOLESTEROLEMIA (ICD-272.0) HYPERTENSION (ICD-401.9) SMOKER (ICD-305.1) FATIGUE (ICD-780.79) CT, CHEST, ABNORMAL (ICD-793.1) COPD (ICD-496) .........................................................................................Marland KitchenWert     - PFT's March 23, 2009 only abn mid flows     -  HFA 75% March 23, 2009  COUGH (ICD-786.2) SPECIAL SCREENING MALIGNANT NEOPLASM OF PROSTATE (ICD-V76.44) SCREENING, COLON CANCER (ICD-V76.51) WEIGHT LOSS (ICD-783.21) HEMOPTYSIS (ICD-786.3) FAMILY HISTORY DIABETES 1ST DEGREE RELATIVE (ICD-V18.0) HEPATITIS (ICD-573.3) GERD (ICD-530.81) ARTHRITIS (ICD-716.90)    Vital Signs:  Patient profile:   60 year old male Weight:      158.38 pounds O2 Sat:      98 % on Room air Temp:     97.8 degrees F oral Pulse rate:   71 / minute BP sitting:   110 / 70  (left arm)  Vitals Entered By: Tilden Dome (June 07, 2009 12:22  PM)  O2 Flow:  Room air  Physical Exam  Additional Exam:  wt 147 > 151 February 13, 2009 >  158 June 07, 2009  amb slt hoarse amb wm nad  HEENT mild turbinate edema.  Oropharynx edentulous, no thrush or excess pnd or cobblestoning.  No JVD or cervical adenopathy. Mild accessory muscle hypertrophy. Trachea midline, nl thryroid. Chest was hyperinflated by percussion with diminished breath sounds and moderate increased exp time without wheeze. Hoover sign positive at mid inspiration. Regular rate and rhythm without murmur gallop or rub or increase P2 or edema.  Abd: no hsm, nl excursion. Ext warm without cyanosis or clubbing.     Impression & Recommendations:  Problem # 1:  COPD ([id number]) He really has minimal copd   I took this opportunity to educate the patient regarding the consequences of smoking in airway disorders based on all the data we have from the multiple national lung health studies indicating that smoking cessation, not choice of inhalers or physicians, is the most important aspect of care.   Therefore if maintains off cigs can expect to wean or d/c  symbicort and if resumes smoking even high doses of symbicort won't likely be enough to control all his symptoms  See instructions for specific recommendations   Problem # 2:  CT, CHEST, ABNORMAL (ICD-793.1)  CT's and cxr's reviewed and appear to show resolution of lul peribronchiectatic process c/w healing inflammatory process with residual scar.  Other Orders: T-2 View CXR ([id number]) Est. Patient Level IV [id number])  Patient Instructions: 1)  stay off the cigarettes - keep up the good work! 2)  only if not needing the proaire at all is it ok to taper off symbicort  3)  Please schedule a follow-up appointment in 6 months, sooner if needed

## 2010-06-27 NOTE — Letter (Signed)
Summary: Encounter Notice/[place]  Encounter Notice/[place]   Imported By: Edmonia James 12/07/2009 11:47:33  _____________________________________________________________________  External Attachment:    Type:   Image     Comment:   External Document
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
## 2010-06-27 NOTE — Assessment & Plan Note (Signed)
Summary: Pulmonary/ ext ov with walking sats =  nl     Copy to:  Dr. Basil Dess Primary Provider/Referring Provider:  [name] MD  CC:  Acute visit.  Pt c/o increased SOB over the past several days since weather has been warmer.  He states that he has tried using the proair several times a day and this does not help.  He has tried using his grandson's albuterol nebs and states that this helps.  He also c/o wheezing and prod cough with clear to light yellow sputum.  [name]  History of Present Illness: 32  yowm quit smoking 05/2009 informed he had emphysema in 2007 sent to Pulmonary for eval of abn ct Chest done 12/28/08.  January 12, 2009 initial pulmonary eval  c/o sob that has worsened  gradually since winter of 2010 to point where point where can still do grocery shopping ok slow pace with handicap parking,  steps x one flight but stop at top to catch breath, spiriva did not help at all.   rec symbicort and as needed combivent  February 13, 2009 ov some better, not using combivent much, still smoking actively but thinking about quitting.  less cough, still sob > slow adl's  June 07, 2009 3 month followup with cxr.  Pt states that his breathing is about the same- no better or worse.  He states that he woke up with rt side cp about 1 wk ago.  Has not noticed any pain since then. stopped smoking since last ov and not consistent with symbicort.  rec work on hfa, maintain off cigs  August 22, 2009 Acute visit. never got any better but not smoking Pt c/o prod cough with green/yellow sptutum x 3-4 days.  Also c/o wheezing "all the time"- worsens when lies down.  rec optimal symbicort 160 mg and short course of prednisone  October 26, 2009 Acute visit.  Pt c/o increased SOB over the past several days since weather has been warmer.  He states that he has tried using the proair several times a day and this does not help.  He has tried using his grandson's albuterol nebs and states that this helps some.  stopped symbicort am 6/2 no worse off it.  He also c/o wheezing and prod cough with clear to light yellow sputum. Pt denies any significant sore throat, dysphagia, itching, sneezing,  nasal congestion or excess secretions,  fever, chills, sweats, unintended wt loss, pleuritic or exertional cp, hempoptysis, change in activity tolerance  orthopnea pnd or leg swelling.  med list not  accurate, multiple inconsistencies, ? taking pepcid at hs  Current Medications (verified): 1)  Nitroglycerin 0.4 Mg Subl (Nitroglycerin) .... Place 1 Tablet Under Tongue As Directed 2)  Toprol Xl 25 Mg Xr24h-Tab (Metoprolol Succinate) .... Take 1/2 Tablet By Mouth Once A Day 3)  Amphetamine Salt Er 20mg  .... Takes 1 Tab 2 Times A Day 4)  Aspir-Low 81 Mg Tbec (Aspirin) .... One A Day 5)  Mobic 15 Mg Tabs (Meloxicam) .[name].. 1 Tablet A Day 6)  Lovastatin 40 Mg Tabs (Lovastatin) .[name].. 1 Tab A Day 7)  Trazodone Hcl 100 Mg Tabs (Trazodone Hcl) .[name].. 1 Tablet A Day 8)  Gabapentin 300 Mg Caps (Gabapentin) .... Take One Tablet Twice A Day 9)  Percocet 10-325 Mg Tabs (Oxycodone-Acetaminophen) .... Takes 1 Pill 3-4 Times A Day 10)  Proair Hfa 108 (90 Base) Mcg/act Aers (Albuterol Sulfate) .... As Needed 11)  Opana Er 30 Mg Xr12h-Tab (Oxymorphone  Hcl) .... 1 Tab Two Times A Day 12)  Albuterol Sol (? Strength) .... As Needed  Allergies (verified): No Known Drug Allergies  Past History:  Past Medical History: A. Flutter s/p ablation by Dr. Lovena Le CAD, ARTERY BYPASS GRAFT (ICD-414.04) HYPERCHOLESTEROLEMIA (ICD-272.0) HYPERTENSION (ICD-401.9) FATIGUE (ICD-780.79) CT, CHEST, ABNORMAL (ICD-793.1)     - 12/28/2008 LUL scar /minimal RLL rn changes COPD (ICD-496) .........................................................................................Marland KitchenWert     - PFT's March 23, 2009 only abn mid flows, nl dlco     - HFA 75% March 23, 2009 > 75% August 22, 2009 > 90% October 26, 2009  COUGH (ICD-786.2) SPECIAL SCREENING MALIGNANT  NEOPLASM OF PROSTATE (ICD-V76.44) SCREENING, COLON CANCER (ICD-V76.51) WEIGHT LOSS (ICD-783.21) HEMOPTYSIS (ICD-786.3) FAMILY HISTORY DIABETES 1ST DEGREE RELATIVE (ICD-V18.0) HEPATITIS (ICD-573.3) GERD (ICD-530.81) ARTHRITIS (ICD-716.90)    Vital Signs:  Patient profile:   60 year old male Weight:      159 pounds O2 Sat:      97 % on Room air Temp:     98.2 degrees F oral Pulse rate:   85 / minute BP sitting:   132 / 70  (left arm)  Vitals Entered By: Tilden Dome (October 26, 2009 11:18 AM)  O2 Flow:  Room air  Serial Vital Signs/Assessments:  Comments: 12:10 PM Ambulatory Pulse Oximetry  Resting; HR__78___    02 Sat__98%ra___  Lap1 (185 feet)   HR__79___   02 Sat__98%ra___ Lap2 (185 feet)   HR__80___   02 Sat__99%ra___    Lap3 (185 feet)   HR__79___   02 Sat__98%ra___  _x__Test Completed without Difficulty ___Test Stopped due to:   By: Tilden Dome    Physical Exam  Additional Exam:  wt 147 > 151 February 13, 2009 >  158 June 07, 2009 > 156 August 22, 2009 > 159 October 26, 2009  amb slt hoarse anxious  amb wm nad  HEENT mild turbinate edema.  Oropharynx edentulous, no thrush or excess pnd or cobblestoning.  No JVD or cervical adenopathy. Mild accessory muscle hypertrophy. Trachea midline, nl thryroid. Chest was hyperinflated by percussion with diminished breath sounds and moderate increased exp time without wheeze. Hoover sign positive at mid inspiration. Regular rate and rhythm without murmur gallop or rub or increase P2 or edema.  Abd: no hsm, nl excursion. Ext warm without cyanosis or clubbing.     CXR  Procedure date:  10/26/2009  Findings:      Comparison: 06/07/2009   Findings: The cardiac silhouette, mediastinal and hilar contours are within normal limits.  There are chronic lung changes/COPD but no acute pulmonary findings.  There are surgical changes noted the left upper abdomen.  There is a linear radiodensity at the level of the left  hemidiaphragm which is likely artifact.  This is not visualized on the lateral film.   IMPRESSION: Chronic lung changes/COPD but no acute pulmonary findings.  Impression & Recommendations:  Problem # 1:  COPD (ICD-496) Markedly disproportionate symptoms relative to present signs of a true exacerbation in settin of baseline PFT's near normal also   DDX of  difficult airways managment all start with A and  include Adherence, Ace Inhibitors, Acid Reflux, Active Sinus Disease, Alpha 1 Antitripsin deficiency, Anxiety masquerading as Airways dz,  ABPA,  allergy(esp in young), Aspiration (esp in elderly), Adverse effects of DPI,  Active smokers, Acquaintence of Health care provider,  plus one B  = Beta blocker use..   ? Adherence:  strongly doubt, need to use a trust but verify  approact here. See instructions for specific recommendations  I spent extra time with the patient today explaining optimal mdi  technique.  This improved from  75-90%.  since better after alb neb should get just as much benefit from dulera 200 if uses consistently  ? acid reflux.  try diet, am ppi and hs h2  ? Active Sinus dz: next ov if not better needs sinus ct  ? Anxiety  ? Acquaintence of HCP (wife is a Marine scientist)  ? Beta blocker effect...probably not at such a low dose.  Each maintenance medication was reviewed in detail including most importantly the difference between maintenance prns and under what circumstances the prns are to be used.  In addition, these two groups (for which the patient should keep up with refills) were distinguished from a third group :  meds that are used only short term with the intent to complete a course of therapy and then not refill them.  The med list was then fully reconciled and reorganized to reflect this important distinction. See instructions for specific recommendations   Problem # 2:  CT, CHEST, ABNORMAL (ICD-793.1) CT's and cxr's reviewed and appear to show resolution of lul  peribronchiectatic process c/w healing inflammatory process with residual scar and no acute changes on today's cxr     Medications Added to Medication List This Visit: 1)  Nexium 40 Mg Cpdr (Esomeprazole magnesium) .... By mouth daily. take one half hour before eating. 2)  Dulera 200-5 Mcg/act Aero (Mometasone furo-formoterol fum) .... 2 puffs first thing  in am and 2 puffs again in pm about 12 hours later 3)  Pepcid Ac Maximum Strength 20 Mg Tabs (Famotidine) .... One at bedtime 4)  Albuterol Sol (? Strength)  .... As needed 5)  Mucinex Dm 30-600 Mg Xr12h-tab (Dextromethorphan-guaifenesin) .[name].. 1-2 every 12hours as needed for cough or congestion 6)  Albuterol Sulfate (2.5 Mg/12ml) 0.083% Nebu (Albuterol sulfate) .... One every 4 hours if needed  Other Orders: Pulse Oximetry, Ambulatory (94761) HFA Instruction [phone]) T-2 View CXR (71020TC) Est. Patient Level IV [id number])  Patient Instructions: 1)  Start nexium 40 Take  one 30-60 min before first meal of the day and pepcid 20 mg at bedtime 2)  stop symbicort 3)  start dulera 200 2 puffs first thing  in am and 2 puffs again in pm about 12 hours later and as needed you can use proaire or your nebulizer  4)  for cough mucinex dm 5)  GERD (REFLUX)  is a common cause of respiratory symptoms. It commonly presents without heartburn and can be treated with medication, but also with lifestyle changes including avoidance of late meals, excessive alcohol, smoking cessation, and avoid fatty foods, chocolate, peppermint, colas, red wine, and acidic juices such as orange juice. NO MINT OR MENTHOL PRODUCTS SO NO COUGH DROPS  6)  USE SUGARLESS CANDY INSTEAD (jolley ranchers)  7)  NO OIL BASED VITAMINS  8)  See Tammy NP w/in 2 weeks with all your medications, even over the counter meds, separated in two separate bags, the ones you take no matter what vs the ones you stop once you feel better and take only as needed.  She will generate for you a new user  friendly medication calendar that will put [place] all on the same page re: your medication use.  Prescriptions: ALBUTEROL SULFATE (2.5 MG/3ML) 0.083%  NEBU (ALBUTEROL SULFATE) one every 4 hours if needed  #25 x 11   Entered and Authorized by:  Tanda Rockers MD   Signed by:   Tanda Rockers MD on 10/26/2009   Method used:   Electronically to        [facility] (retail)       [address], [place]  [address]       Ph: [phone]       Fax: [phone]   RxID:   [id number]    CXR  Procedure date:  10/26/2009  Findings:      Comparison: 06/07/2009   Findings: The cardiac silhouette, mediastinal and hilar contours are within normal limits.  There are chronic lung changes/COPD but no acute pulmonary findings.  There are surgical changes noted the left upper abdomen.  There is a linear radiodensity at the level of the left hemidiaphragm which is likely artifact.  This is not visualized on the lateral film.   IMPRESSION: Chronic lung changes/COPD but no acute pulmonary findings.
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
## 2010-06-27 NOTE — Miscellaneous (Signed)
  Clinical Lists Changes  Medications: Added new medication of LIPITOR 80 MG TABS (ATORVASTATIN CALCIUM) take one tablet daily     Prior Medications: TOPROL XL 25 MG XR24H-TAB (METOPROLOL SUCCINATE) 1 by mouth daily OPANA ER 40 MG XR12H-TAB (OXYMORPHONE HCL) Take 1 tablet by mouth two times a day ADDERALL 30 MG TABS (AMPHETAMINE-DEXTROAMPHETAMINE) Take 1 tablet by mouth two times a day TRAZODONE HCL 100 MG TABS (TRAZODONE HCL) Take 1 tab by mouth at bedtime GABAPENTIN 300 MG CAPS (GABAPENTIN) take 3 capsules by mouth at bedtime ASPIR-LOW 81 MG TBEC (ASPIRIN) ONE A DAY VITAMIN D 1000 UNIT TABS (CHOLECALCIFEROL) Take 1 tab by mouth at bedtime DULERA 200-5 MCG/ACT AERO (MOMETASONE FURO-FORMOTEROL FUM) 2 puffs first thing  in am and 2 puffs again in pm about 12 hours later ZYRTEC ALLERGY 10 MG TABS (CETIRIZINE HCL) Take 1 tab by mouth at bedtime as needed MUCINEX DM 30-600 MG XR12H-TAB (DEXTROMETHORPHAN-GUAIFENESIN) 1-2 every 12hours as needed for cough or congestion VENTOLIN HFA 108 (90 BASE) MCG/ACT AERS (ALBUTEROL SULFATE) 2 puffs every 4 hr as needed wheeizng NITROSTAT 0.4 MG SUBL (NITROGLYCERIN) 1 under tongue every 5 minutes x3 as needed chest pain VOLTAREN 1 % GEL (DICLOFENAC SODIUM) apply as directed as needed joint pain ROXICODONE 5 MG TABS (OXYCODONE HCL) take 2 tablets every 4 hours VIAGRA 100 MG TABS (SILDENAFIL CITRATE) 1 by mouth 30 minutes before intercourse COMBIVENT 18-103 MCG/ACT AERO (IPRATROPIUM-ALBUTEROL) 2 puffs 4 times daily (dispense 1 month supply) CLONAZEPAM 0.5 MG TABS (CLONAZEPAM) 1 by mouth two times a day Current Allergies: No known allergies
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
## 2010-06-27 NOTE — Miscellaneous (Signed)
  Clinical Lists Changes  Medications: Removed medication of LOVASTATIN 40 MG TABS (LOVASTATIN) Take 1 tab by mouth at bedtime Added new medication of LIPITOR 80 MG TABS (ATORVASTATIN CALCIUM) take one tablet at night - Signed Rx of LIPITOR 80 MG TABS (ATORVASTATIN CALCIUM) take one tablet at night;  #30 x 11;  Signed;  Entered by: Zenda Alpers CMA (AAMA);  Authorized by: [name] MD;  Method used: Electronically to [facility] [place], [place], [place]  [address], Ph: [phone], Fax: [phone]    Prescriptions: LIPITOR 80 MG TABS (ATORVASTATIN CALCIUM) take one tablet at night  #30 x 11   Entered by:   Zenda Alpers CMA ([place])   Authorized by:   [name] MD   Signed by:   Zenda Alpers CMA ([place]) on 11/16/2009   Method used:   Electronically to        [facility] (retail)       [address]       [place], [place]  [address]       Ph: [phone]       Fax: [phone]   [id number]   Prior Medications: TOPROL XL 25 MG XR24H-TAB (METOPROLOL SUCCINATE) take 1/2 tab by mouth  every morning and every evening MOBIC 15 MG TABS (MELOXICAM) 1 TABLET A DAY OPANA ER 40 MG XR12H-TAB (OXYMORPHONE HCL) Take 1 tablet by mouth two times a day ADDERALL 30 MG TABS (AMPHETAMINE-DEXTROAMPHETAMINE) Take 1 tablet by mouth two times a day TRAZODONE HCL 100 MG TABS (TRAZODONE HCL) Take 1 tab by mouth at bedtime GABAPENTIN 300 MG CAPS (GABAPENTIN) take 3 capsules by mouth at bedtime ASPIR-LOW 81 MG TBEC (ASPIRIN) ONE A DAY OMEPRAZOLE 20 MG TBEC (OMEPRAZOLE) Take 1 tablet by mouth once a day 95minutes before breakfast PEPCID AC MAXIMUM STRENGTH 20 MG TABS (FAMOTIDINE) one at bedtime VITAMIN D 1000 UNIT TABS (CHOLECALCIFEROL) Take 1 tab by mouth at bedtime POTASSIUM GLUCONATE 595 MG TABS (POTASSIUM GLUCONATE) Take 1 tab by mouth at bedtime DULERA 200-5 MCG/ACT AERO (MOMETASONE FURO-FORMOTEROL FUM) 2 puffs first thing  in am and 2 puffs again in pm about  12 hours later ZYRTEC ALLERGY 10 MG TABS (CETIRIZINE HCL) Take 1 tab by mouth at bedtime MUCINEX DM 30-600 MG XR12H-TAB (DEXTROMETHORPHAN-GUAIFENESIN) 1-2 every 12hours as needed for cough or congestion VENTOLIN HFA 108 (90 BASE) MCG/ACT AERS (ALBUTEROL SULFATE) 2 puffs every 4 hr as needed wheeizng ALBUTEROL SULFATE (2.5 MG/3ML) 0.083%  NEBU (ALBUTEROL SULFATE) one every 4 hours if needed PERCOCET 10-325 MG TABS (OXYCODONE-ACETAMINOPHEN) Take 1 tablet by mouth every 6 hours as needed NITROSTAT 0.4 MG SUBL (NITROGLYCERIN) 1 under tongue every 5 minutes x3 as needed chest pain VOLTAREN 1 % GEL (DICLOFENAC SODIUM) apply as directed as needed joint pain MMW () 1 tsp swish and swallow four times daily Current Allergies: No known allergies

## 2010-06-27 NOTE — Progress Notes (Signed)
----   Converted from flag ---- ---- 06/14/2010 1:36 PM, [name] MD wrote: [place], several days before, fasting BMP, HFP, FLP, CBC with diff, TSH, PSA: 272.4, v58.69, v76.44   ---- 06/14/2010 11:23 AM, Daralene Milch CMA (AAMA) wrote: Lab orders please! Good Morning! This pt is scheduled for labs Monday, which labs to draw and dx codes to use? Thanks Tasha ------------------------------

## 2010-06-27 NOTE — Progress Notes (Signed)
Summary: wants pulmonary referral  Phone Note Call from Patient Call back at Home Phone [phone]   Caller: Spouse Summary of Call: Pt's wife wants pt to be referred to [facility], that number is [phone], [phone], for his pulmonary issues. Initial call taken by: Marty Heck CMA, AAMA,  March 22, 2010 11:14 AM  Follow-up for Phone Call        I think this is a reasonable request. I know he has discussed before getting a second pulmonary opinion to me. [name] MD  March 22, 2010 11:35 AM
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
## 2010-06-27 NOTE — Letter (Signed)
Summary: [place] No Show Letter  [place] [facility]  [address] [place], [place] [address]   Phone: [phone]  Fax: [phone]    11/28/2009 MRN: [id number]  [name] [address] [place], [place]  [address]   Dear Mr. Brede,   Our records indicate that you missed your scheduled appointment with ____lab_________________ on __7.6.11__________.  Please contact this office to reschedule your appointment as soon as possible.  It is important that you keep your scheduled appointments with your physician, so we can provide you the best care possible.  Please be advised that there may be a charge for "no show" appointments.    Sincerely,   [place] at [facility]

## 2010-06-27 NOTE — Letter (Signed)
Summary: [facility]  P[facility]   Imported By: Bubba Hales 04/27/2010 09:33:12  _____________________________________________________________________  External Attachment:    Type:   Image     Comment:   External Document  Appended Document: [facility] ongoing pulm work-up, some restriction. [facility].  ? interstitial lung disease, avian involvement or hypersensitivity

## 2010-06-27 NOTE — Progress Notes (Signed)
Summary: wants antibiotic  Phone Note Call from Patient Call back at Home Phone [phone]   Caller: [name]- wife  Call For: [name] Loffler MD Summary of Call: Patient's wife is requesting antibiotic for patient. She says that he has an appt for today, but just doesn't have the energy to get out. She says that he has productive cough, congestion and just feels run down. He has no fever. [facility]ses [facility].  Initial call taken by: Lacretia Nicks,  June 19, 2010 2:07 PM  Follow-up for Phone Call        i really need to see him -- he has lung cancer, has had severe pneumonias.  i want to see him, look at him, and listen to his lungs to ensure good care. [name] Loffler MD  June 19, 2010 2:42 PM   Patient notified.  Follow-up by: Lacretia Nicks,  June 19, 2010 2:46 PM

## 2010-06-27 NOTE — Progress Notes (Signed)
Summary: RESULTS  Phone Note Call from Patient   Caller: Patient Call For: [facility] Summary of Call: [name] Initial call taken by: Gustavus Bryant,  January 18, 2010 4:14 PM  Follow-up for Phone Call        Spoke with pt and notified of results/recs per MW.  Pt verbalized understanding. Follow-up by: Tilden Dome,  January 18, 2010 4:22 PM
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
## 2010-06-27 NOTE — Letter (Signed)
Summary: [place] No Show Letter  [place] [facility]  [address] [place], [place] [address]   Phone: [phone]  Fax: [phone]    06/18/2010 MRN: [id number]  JAVIEON ENCISO [place], [place]  [address]   Dear Mr. Langner,   Our records indicate that you missed your scheduled appointment with __Laboratory___________________ on __1.23.2012__________.  Please contact this office to reschedule your appointment as soon as possible.  It is important that you keep your scheduled appointments with your physician, so we can provide you the best care possible.  Please be advised that there may be a charge for "no show" appointments.    Sincerely,    at [facility]
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
## 2010-07-03 NOTE — Progress Notes (Signed)
Summary: wants a different antibiotic  Phone Note Call from Patient Call back at [phone]   Caller: Spouse Reason for Call: Lab or Test Results Summary of Call: Pt was seen last week and given doxycycline.  This has been causing diarrhea and wife is asking if it can be changed to something else.  [facility]ses [facility]. Initial call taken by: Marty Heck CMA, AAMA,  June 24, 2010 9:00 AM  Follow-up for Phone Call        Change to avelox 400 mg daily x 5 days.  Follow-up by: Eliezer Lofts MD,  June 24, 2010 9:30 AM    New/Updated Medications: AVELOX 400 MG TABS (MOXIFLOXACIN HCL) 1 tab by mouth dailty x 5 days Prescriptions: AVELOX 400 MG TABS (MOXIFLOXACIN HCL) 1 tab by mouth dailty x 5 days  #5 x 0   Entered by:   Zenda Alpers CMA ([place])   Authorized by:   Eliezer Lofts MD   Signed by:   Zenda Alpers CMA ([place]) on 06/24/2010   Method used:   Electronically to        [facility] (retail)       [address]       [place], [place]  [address]       Ph: [phone]       Fax: [phone]   RxIDAY:[id number] AVELOX 400 MG TABS (MOXIFLOXACIN HCL) 1 tab by mouth dailty x 5 days  #5 x 0   Entered and Authorized by:   Eliezer Lofts MD   Signed by:   Eliezer Lofts MD on 06/24/2010   Method used:   Electronically to        [facility] (retail)       [address]       [place], [place]  [address]       Ph: [phone]       Fax: [phone]   RxID:   [id number]  Patients wife notified and pharmacy notifed rx sent to wrong pharmacy  Appended Document: wants a different antibiotic Advised pt's wife that medicine had been called in.  She was not advised earlier, just given a message to call the office.

## 2010-07-09 ENCOUNTER — Telehealth: Payer: Self-pay | Admitting: Internal Medicine

## 2010-07-10 ENCOUNTER — Encounter: Payer: Self-pay | Admitting: Family Medicine

## 2010-07-17 NOTE — Progress Notes (Signed)
Summary: Discussed with wife dx of lung ca  Phone Note Call from Patient   Caller: Spouse Gabriel Carina Call For: [name] Summary of Call: requests to speak to dr Dariann Huckaba. pt is current having surgery (as this msg was being taken) at [facility]. pt is having lower parts of each lung removed due to lung cancer. caller feels this could have been found earlier if dr Oluwaseun Bruyere had done a biopsy.  beth [id number] Initial call taken by: Cooper Render, CNA,  July 09, 2010 9:13 AM  Follow-up for Phone Call        Spoke with pt wife Eustaquio Maize and she states she wanted to make Dr. Melvyn Novas aware that her pt is currently in surgery getting his left lower lobe and right lower lobe removed due to Lung cancer. She states that they repeatedley asked Dr. Melvyn Novas to do a bronch because of his SOB and cough and it was not done. Apparently they asked his PCP to refer them to [facility] for a second opinion back in November and that is where the cancer was discovered. Pt wife states she is very upset that this was not discovered by Dr. Melvyn Novas and wanted him to know. Watersmeet Bing CMA  July 09, 2010 11:09 AM  Follow-up by: Tanda Rockers MD,  July 09, 2010 1:29 PM  Additional Follow-up for Phone Call Additional follow up Details #1::        Called pt's wife and found out he had a ct chest at [facility] looking for evidence of HSP and that's how they found a lung cancer - he had previously had a CT chest for [place] with no cancer in the area of the lung where he has one now and there was no indication to repeat a CT or much less do an FOB when last seen here in August 2011 and the pt declined further f/u here anyway.  Wished her and her husband the best and offered to make any and all records available Additional Follow-up by: Tanda Rockers MD,  July 09, 2010 1:31 PM

## 2010-07-17 NOTE — Letter (Signed)
Summary: Thoracic Surgery Clinic Note  Thoracic Surgery Clinic Note   Imported By: Jamelle Haring 07/08/2010 10:32:43  _____________________________________________________________________  External Attachment:    Type:   Image     Comment:   External Document
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
## 2010-07-30 ENCOUNTER — Encounter: Payer: Self-pay | Admitting: Family Medicine

## 2010-08-08 LAB — COMPREHENSIVE METABOLIC PANEL
ALT: 11 U/L (ref 0–53)
CO2: 29 mEq/L (ref 19–32)
Calcium: 9.9 mg/dL (ref 8.4–10.5)
Chloride: 104 mEq/L (ref 96–112)
Creatinine, Ser: 1.9 mg/dL — ABNORMAL HIGH (ref 0.4–1.5)
GFR calc non Af Amer: 36 mL/min — ABNORMAL LOW (ref >60)
Glucose, Bld: 95 mg/dL (ref 70–99)
Sodium: 139 mEq/L (ref 135–145)
Total Bilirubin: 0.6 mg/dL (ref 0.3–1.2)

## 2010-08-08 LAB — BASIC METABOLIC PANEL
BUN: 17 mg/dL (ref 6–23)
BUN: 22 mg/dL (ref 6–23)
CO2: 29 mEq/L (ref 19–32)
CO2: 31 mEq/L (ref 19–32)
Calcium: 8.4 mg/dL (ref 8.4–10.5)
Calcium: 9.3 mg/dL (ref 8.4–10.5)
Chloride: 103 mEq/L (ref 96–112)
Creatinine, Ser: 1.34 mg/dL (ref 0.4–1.5)
Creatinine, Ser: 1.53 mg/dL — ABNORMAL HIGH (ref 0.4–1.5)
GFR calc Af Amer: 57 mL/min — ABNORMAL LOW (ref >60)
GFR calc non Af Amer: 55 mL/min — ABNORMAL LOW (ref >60)
Glucose, Bld: 135 mg/dL — ABNORMAL HIGH (ref 70–99)
Glucose, Bld: 183 mg/dL — ABNORMAL HIGH (ref 70–99)
Sodium: 137 mEq/L (ref 135–145)

## 2010-08-08 LAB — DIFFERENTIAL
Basophils Absolute: 0.1 10*3/uL (ref 0.0–0.1)
Eosinophils Absolute: 0.1 10*3/uL (ref 0.0–0.7)
Eosinophils Relative: 1 % (ref 0–5)
Lymphs Abs: 5.2 10*3/uL — ABNORMAL HIGH (ref 0.7–4.0)
Neutrophils Relative %: 51 % (ref 43–77)

## 2010-08-08 LAB — CBC
HCT: 44.5 % (ref 39.0–52.0)
Hemoglobin: 11.9 g/dL — ABNORMAL LOW (ref 13.0–17.0)
MCH: 28.5 pg (ref 26.0–34.0)
MCH: 29.4 pg (ref 26.0–34.0)
MCHC: 31.8 g/dL (ref 30.0–36.0)
MCV: 90.8 fL (ref 78.0–100.0)
Platelets: 285 10*3/uL (ref 150–400)
RDW: 15.8 % — ABNORMAL HIGH (ref 11.5–15.5)
RDW: 16 % — ABNORMAL HIGH (ref 11.5–15.5)
WBC: 13.9 10*3/uL — ABNORMAL HIGH (ref 4.0–10.5)

## 2010-08-08 LAB — PROTIME-INR
INR: 0.91 (ref 0.00–1.49)
Prothrombin Time: 12.5 seconds (ref 11.6–15.2)

## 2010-08-11 LAB — DIFFERENTIAL
Basophils Relative: 0 % (ref 0–1)
Eosinophils Absolute: 0 10*3/uL (ref 0.0–0.7)
Eosinophils Absolute: 0 10*3/uL (ref 0.0–0.7)
Lymphs Abs: 2.4 10*3/uL (ref 0.7–4.0)
Monocytes Absolute: 0 10*3/uL — ABNORMAL LOW (ref 0.1–1.0)
Monocytes Absolute: 0.4 10*3/uL (ref 0.1–1.0)
Neutro Abs: 37.2 10*3/uL — ABNORMAL HIGH (ref 1.7–7.7)
Neutrophils Relative %: 96 % — ABNORMAL HIGH (ref 43–77)

## 2010-08-11 LAB — CBC
HCT: 37.3 % — ABNORMAL LOW (ref 39.0–52.0)
HCT: 40.8 % (ref 39.0–52.0)
Hemoglobin: 13.4 g/dL (ref 13.0–17.0)
Hemoglobin: 13.8 g/dL (ref 13.0–17.0)
Hemoglobin: 14.5 g/dL (ref 13.0–17.0)
MCH: 30.2 pg (ref 26.0–34.0)
MCH: 30.3 pg (ref 26.0–34.0)
MCV: 87.5 fL (ref 78.0–100.0)
MCV: 90.5 fL (ref 78.0–100.0)
Platelets: 169 10*3/uL (ref 150–400)
Platelets: 175 10*3/uL (ref 150–400)
Platelets: 186 10*3/uL (ref 150–400)
RBC: 4.36 MIL/uL (ref 4.22–5.81)
RBC: 4.55 MIL/uL (ref 4.22–5.81)
RBC: 4.76 MIL/uL (ref 4.22–5.81)
RDW: 14.7 % (ref 11.5–15.5)
RDW: 15.1 % (ref 11.5–15.5)
WBC: 18.7 10*3/uL — ABNORMAL HIGH (ref 4.0–10.5)
WBC: 24.5 10*3/uL — ABNORMAL HIGH (ref 4.0–10.5)
WBC: 28 10*3/uL — ABNORMAL HIGH (ref 4.0–10.5)
WBC: 34 10*3/uL — ABNORMAL HIGH (ref 4.0–10.5)
WBC: 40 10*3/uL — ABNORMAL HIGH (ref 4.0–10.5)

## 2010-08-11 LAB — BASIC METABOLIC PANEL
BUN: 25 mg/dL — ABNORMAL HIGH (ref 6–23)
CO2: 30 mEq/L (ref 19–32)
Calcium: 8.2 mg/dL — ABNORMAL LOW (ref 8.4–10.5)
Calcium: 8.4 mg/dL (ref 8.4–10.5)
Chloride: 101 mEq/L (ref 96–112)
Creatinine, Ser: 1.26 mg/dL (ref 0.4–1.5)
Creatinine, Ser: 1.35 mg/dL (ref 0.4–1.5)
GFR calc Af Amer: >60 mL/min (ref >60)
GFR calc Af Amer: >60 mL/min (ref >60)
GFR calc non Af Amer: 54 mL/min — ABNORMAL LOW (ref >60)
GFR calc non Af Amer: 59 mL/min — ABNORMAL LOW (ref >60)
Potassium: 4.5 mEq/L (ref 3.5–5.1)
Sodium: 138 mEq/L (ref 135–145)
Sodium: 140 mEq/L (ref 135–145)

## 2010-08-11 LAB — COMPREHENSIVE METABOLIC PANEL
ALT: 18 U/L (ref 0–53)
AST: 48 U/L — ABNORMAL HIGH (ref 0–37)
AST: 55 U/L — ABNORMAL HIGH (ref 0–37)
Albumin: 2.5 g/dL — ABNORMAL LOW (ref 3.5–5.2)
Albumin: 3 g/dL — ABNORMAL LOW (ref 3.5–5.2)
Alkaline Phosphatase: 66 U/L (ref 39–117)
Alkaline Phosphatase: 67 U/L (ref 39–117)
Alkaline Phosphatase: 86 U/L (ref 39–117)
BUN: 38 mg/dL — ABNORMAL HIGH (ref 6–23)
CO2: 25 mEq/L (ref 19–32)
Chloride: 106 mEq/L (ref 96–112)
Chloride: 109 mEq/L (ref 96–112)
Chloride: 97 mEq/L (ref 96–112)
GFR calc Af Amer: 19 mL/min — ABNORMAL LOW (ref >60)
GFR calc Af Amer: 24 mL/min — ABNORMAL LOW (ref >60)
GFR calc non Af Amer: 16 mL/min — ABNORMAL LOW (ref >60)
Glucose, Bld: 129 mg/dL — ABNORMAL HIGH (ref 70–99)
Potassium: 4.8 mEq/L (ref 3.5–5.1)
Potassium: 5.1 mEq/L (ref 3.5–5.1)
Potassium: 5.3 mEq/L — ABNORMAL HIGH (ref 3.5–5.1)
Total Bilirubin: 0.7 mg/dL (ref 0.3–1.2)
Total Bilirubin: 0.8 mg/dL (ref 0.3–1.2)
Total Bilirubin: 1.1 mg/dL (ref 0.3–1.2)
Total Protein: 5.7 g/dL — ABNORMAL LOW (ref 6.0–8.3)

## 2010-08-11 LAB — PROTIME-INR
INR: 1.28 (ref 0.00–1.49)
INR: 1.36 (ref 0.00–1.49)

## 2010-08-11 LAB — CULTURE, RESPIRATORY W GRAM STAIN

## 2010-08-11 LAB — CULTURE, BLOOD (ROUTINE X 2): Culture: NO GROWTH

## 2010-08-11 LAB — URINE MICROSCOPIC-ADD ON

## 2010-08-11 LAB — STREP PNEUMONIAE URINARY ANTIGEN: Strep Pneumo Urinary Antigen: NEGATIVE

## 2010-08-11 LAB — BLOOD GAS, ARTERIAL
Acid-base deficit: 1.1 mmol/L (ref 0.0–2.0)
Bicarbonate: 24.3 mEq/L — ABNORMAL HIGH (ref 20.0–24.0)
Drawn by: 129801
Drawn by: 307971
O2 Content: 2 L/min
O2 Content: 2 L/min
O2 Saturation: 95.4 %
Patient temperature: 98.6
TCO2: 19.8 mmol/L (ref 0–100)
pCO2 arterial: 45.3 mmHg — ABNORMAL HIGH (ref 35.0–45.0)
pCO2 arterial: 46.7 mmHg — ABNORMAL HIGH (ref 35.0–45.0)
pH, Arterial: 7.285 — ABNORMAL LOW (ref 7.350–7.450)
pO2, Arterial: 68.9 mmHg — ABNORMAL LOW (ref 80.0–100.0)
pO2, Arterial: 93.7 mmHg (ref 80.0–100.0)

## 2010-08-11 LAB — LIPID PANEL
Total CHOL/HDL Ratio: 2.7 RATIO
VLDL: 24 mg/dL (ref 0–40)

## 2010-08-11 LAB — HEPATITIS PANEL, ACUTE
HCV Ab: NEGATIVE
Hep A IgM: NEGATIVE
Hep B C IgM: NEGATIVE
Hepatitis B Surface Ag: NEGATIVE

## 2010-08-11 LAB — URINALYSIS, ROUTINE W REFLEX MICROSCOPIC
Glucose, UA: NEGATIVE mg/dL
Protein, ur: NEGATIVE mg/dL
Urobilinogen, UA: 1 mg/dL (ref 0.0–1.0)

## 2010-08-11 LAB — RAPID URINE DRUG SCREEN, HOSP PERFORMED
Barbiturates: NOT DETECTED
Cocaine: NOT DETECTED

## 2010-08-11 LAB — EXPECTORATED SPUTUM ASSESSMENT W GRAM STAIN, RFLX TO RESP C

## 2010-08-11 LAB — PROCALCITONIN: Procalcitonin: 7.78 ng/mL

## 2010-08-11 LAB — CARBOXYHEMOGLOBIN
Carboxyhemoglobin: 0.7 % (ref 0.5–1.5)
Methemoglobin: 2.9 % — ABNORMAL HIGH (ref 0.0–1.5)
Total hemoglobin: 13 g/dL — ABNORMAL LOW (ref 13.5–18.0)

## 2010-08-11 LAB — LEGIONELLA ANTIGEN, URINE: Legionella Antigen, Urine: NEGATIVE

## 2010-08-11 LAB — MAGNESIUM: Magnesium: 1.9 mg/dL (ref 1.5–2.5)

## 2010-08-11 LAB — MRSA PCR SCREENING: MRSA by PCR: NEGATIVE

## 2010-08-11 LAB — ABO/RH: ABO/RH(D): O POS

## 2010-08-11 LAB — CARDIAC PANEL(CRET KIN+CKTOT+MB+TROPI): Relative Index: 1.3 (ref 0.0–2.5)

## 2010-08-11 LAB — APTT: aPTT: 41 seconds — ABNORMAL HIGH (ref 24–37)

## 2010-08-11 LAB — VANCOMYCIN, TROUGH: Vancomycin Tr: 14 ug/mL (ref 10.0–20.0)

## 2010-08-12 ENCOUNTER — Other Ambulatory Visit: Payer: Self-pay | Admitting: Internal Medicine

## 2010-08-12 ENCOUNTER — Encounter (HOSPITAL_BASED_OUTPATIENT_CLINIC_OR_DEPARTMENT_OTHER): Payer: 59 | Admitting: Internal Medicine

## 2010-08-12 DIAGNOSIS — Z79899 Other long term (current) drug therapy: Secondary | ICD-10-CM

## 2010-08-12 DIAGNOSIS — I10 Essential (primary) hypertension: Secondary | ICD-10-CM

## 2010-08-12 DIAGNOSIS — D72829 Elevated white blood cell count, unspecified: Secondary | ICD-10-CM

## 2010-08-12 DIAGNOSIS — C3490 Malignant neoplasm of unspecified part of unspecified bronchus or lung: Secondary | ICD-10-CM

## 2010-08-12 DIAGNOSIS — K219 Gastro-esophageal reflux disease without esophagitis: Secondary | ICD-10-CM

## 2010-08-12 LAB — COMPREHENSIVE METABOLIC PANEL
ALT: <8 U/L (ref 0–53)
AST: 15 U/L (ref 0–37)
Albumin: 3.5 g/dL (ref 3.5–5.2)
Alkaline Phosphatase: 108 U/L (ref 39–117)
BUN: 24 mg/dL — ABNORMAL HIGH (ref 6–23)
Calcium: 9.1 mg/dL (ref 8.4–10.5)
Chloride: 101 mEq/L (ref 96–112)
Potassium: 4.3 mEq/L (ref 3.5–5.3)
Sodium: 138 mEq/L (ref 135–145)
Total Protein: 6.3 g/dL (ref 6.0–8.3)

## 2010-08-12 LAB — CBC WITH DIFFERENTIAL/PLATELET
BASO%: 0.8 % (ref 0.0–2.0)
Eosinophils Absolute: 0.6 10*3/uL — ABNORMAL HIGH (ref 0.0–0.5)
HCT: 37.7 % — ABNORMAL LOW (ref 38.4–49.9)
LYMPH%: 28.5 % (ref 14.0–49.0)
MCHC: 32.1 g/dL (ref 32.0–36.0)
MONO#: 1.7 10*3/uL — ABNORMAL HIGH (ref 0.1–0.9)
NEUT#: 7.8 10*3/uL — ABNORMAL HIGH (ref 1.5–6.5)
Platelets: 430 10*3/uL — ABNORMAL HIGH (ref 140–400)
RBC: 4.34 10*6/uL (ref 4.20–5.82)
WBC: 14.2 10*3/uL — ABNORMAL HIGH (ref 4.0–10.3)
lymph#: 4.1 10*3/uL — ABNORMAL HIGH (ref 0.9–3.3)
nRBC: 0 % (ref 0–0)

## 2010-08-12 LAB — LACTATE DEHYDROGENASE: LDH: 181 U/L (ref 94–250)

## 2010-08-13 NOTE — Letter (Signed)
Summary: [facility]   Imported By: Jamelle Haring 08/05/2010 08:53:47  _____________________________________________________________________  External Attachment:    Type:   Image     Comment:   External Document

## 2010-08-15 ENCOUNTER — Encounter: Payer: Self-pay | Admitting: Cardiology

## 2010-08-26 ENCOUNTER — Ambulatory Visit: Payer: Self-pay | Admitting: Cardiology

## 2010-08-26 ENCOUNTER — Other Ambulatory Visit: Payer: Self-pay | Admitting: Internal Medicine

## 2010-08-26 ENCOUNTER — Encounter (HOSPITAL_BASED_OUTPATIENT_CLINIC_OR_DEPARTMENT_OTHER): Payer: 59 | Admitting: Internal Medicine

## 2010-08-26 DIAGNOSIS — Z5111 Encounter for antineoplastic chemotherapy: Secondary | ICD-10-CM

## 2010-08-26 DIAGNOSIS — D72829 Elevated white blood cell count, unspecified: Secondary | ICD-10-CM

## 2010-08-26 DIAGNOSIS — D751 Secondary polycythemia: Secondary | ICD-10-CM

## 2010-08-26 LAB — COMPREHENSIVE METABOLIC PANEL
AST: 20 U/L (ref 0–37)
Alkaline Phosphatase: 121 U/L — ABNORMAL HIGH (ref 39–117)
Glucose, Bld: 78 mg/dL (ref 70–99)
Potassium: 4.5 mEq/L (ref 3.5–5.3)
Sodium: 136 mEq/L (ref 135–145)
Total Bilirubin: 0.5 mg/dL (ref 0.3–1.2)
Total Protein: 7 g/dL (ref 6.0–8.3)

## 2010-08-26 LAB — CBC WITH DIFFERENTIAL/PLATELET
EOS%: 3.6 % (ref 0.0–7.0)
Eosinophils Absolute: 0.5 10*3/uL (ref 0.0–0.5)
LYMPH%: 28 % (ref 14.0–49.0)
MCH: 28 pg (ref 27.2–33.4)
MCHC: 32.3 g/dL (ref 32.0–36.0)
MCV: 86.8 fL (ref 79.3–98.0)
MONO%: 8.8 % (ref 0.0–14.0)
NEUT#: 7.7 10*3/uL — ABNORMAL HIGH (ref 1.5–6.5)
Platelets: 398 10*3/uL (ref 140–400)
RBC: 4.71 10*6/uL (ref 4.20–5.82)
RDW: 15.1 % — ABNORMAL HIGH (ref 11.0–14.6)

## 2010-08-27 ENCOUNTER — Encounter: Payer: 59 | Admitting: Internal Medicine

## 2010-08-27 DIAGNOSIS — Z5189 Encounter for other specified aftercare: Secondary | ICD-10-CM

## 2010-08-27 DIAGNOSIS — C3490 Malignant neoplasm of unspecified part of unspecified bronchus or lung: Secondary | ICD-10-CM

## 2010-09-01 ENCOUNTER — Inpatient Hospital Stay (HOSPITAL_COMMUNITY)
Admission: EM | Admit: 2010-09-01 | Discharge: 2010-09-14 | DRG: 194 | Disposition: A | Discharge status: Home / Self Care | Payer: 59 | Attending: Internal Medicine | Admitting: Internal Medicine

## 2010-09-01 ENCOUNTER — Emergency Department (HOSPITAL_COMMUNITY): Payer: 59

## 2010-09-01 ENCOUNTER — Other Ambulatory Visit: Payer: Self-pay | Admitting: Internal Medicine

## 2010-09-01 DIAGNOSIS — Z7982 Long term (current) use of aspirin: Secondary | ICD-10-CM

## 2010-09-01 DIAGNOSIS — J189 Pneumonia, unspecified organism: Principal | ICD-10-CM | POA: Diagnosis present

## 2010-09-01 DIAGNOSIS — E785 Hyperlipidemia, unspecified: Secondary | ICD-10-CM | POA: Diagnosis present

## 2010-09-01 DIAGNOSIS — E875 Hyperkalemia: Secondary | ICD-10-CM | POA: Diagnosis not present

## 2010-09-01 DIAGNOSIS — K5900 Constipation, unspecified: Secondary | ICD-10-CM | POA: Diagnosis not present

## 2010-09-01 DIAGNOSIS — I959 Hypotension, unspecified: Secondary | ICD-10-CM | POA: Diagnosis present

## 2010-09-01 DIAGNOSIS — R748 Abnormal levels of other serum enzymes: Secondary | ICD-10-CM | POA: Diagnosis present

## 2010-09-01 DIAGNOSIS — I5020 Unspecified systolic (congestive) heart failure: Secondary | ICD-10-CM | POA: Diagnosis present

## 2010-09-01 DIAGNOSIS — Z951 Presence of aortocoronary bypass graft: Secondary | ICD-10-CM

## 2010-09-01 DIAGNOSIS — C3490 Malignant neoplasm of unspecified part of unspecified bronchus or lung: Secondary | ICD-10-CM | POA: Diagnosis present

## 2010-09-01 DIAGNOSIS — I10 Essential (primary) hypertension: Secondary | ICD-10-CM | POA: Diagnosis present

## 2010-09-01 DIAGNOSIS — J449 Chronic obstructive pulmonary disease, unspecified: Secondary | ICD-10-CM | POA: Diagnosis present

## 2010-09-01 DIAGNOSIS — I739 Peripheral vascular disease, unspecified: Secondary | ICD-10-CM | POA: Diagnosis present

## 2010-09-01 DIAGNOSIS — G8929 Other chronic pain: Secondary | ICD-10-CM | POA: Diagnosis present

## 2010-09-01 DIAGNOSIS — I509 Heart failure, unspecified: Secondary | ICD-10-CM | POA: Diagnosis present

## 2010-09-01 DIAGNOSIS — J90 Pleural effusion, not elsewhere classified: Secondary | ICD-10-CM | POA: Diagnosis present

## 2010-09-01 DIAGNOSIS — Z902 Acquired absence of lung [part of]: Secondary | ICD-10-CM

## 2010-09-01 DIAGNOSIS — T4275XA Adverse effect of unspecified antiepileptic and sedative-hypnotic drugs, initial encounter: Secondary | ICD-10-CM | POA: Diagnosis not present

## 2010-09-01 DIAGNOSIS — D649 Anemia, unspecified: Secondary | ICD-10-CM | POA: Diagnosis present

## 2010-09-01 DIAGNOSIS — Z79899 Other long term (current) drug therapy: Secondary | ICD-10-CM

## 2010-09-01 DIAGNOSIS — Z87891 Personal history of nicotine dependence: Secondary | ICD-10-CM

## 2010-09-01 DIAGNOSIS — F341 Dysthymic disorder: Secondary | ICD-10-CM | POA: Diagnosis present

## 2010-09-01 DIAGNOSIS — B002 Herpesviral gingivostomatitis and pharyngotonsillitis: Secondary | ICD-10-CM | POA: Diagnosis present

## 2010-09-01 DIAGNOSIS — F05 Delirium due to known physiological condition: Secondary | ICD-10-CM | POA: Diagnosis not present

## 2010-09-01 DIAGNOSIS — Z809 Family history of malignant neoplasm, unspecified: Secondary | ICD-10-CM

## 2010-09-01 DIAGNOSIS — M549 Dorsalgia, unspecified: Secondary | ICD-10-CM | POA: Diagnosis present

## 2010-09-01 DIAGNOSIS — I252 Old myocardial infarction: Secondary | ICD-10-CM

## 2010-09-01 DIAGNOSIS — I2510 Atherosclerotic heart disease of native coronary artery without angina pectoris: Secondary | ICD-10-CM | POA: Diagnosis present

## 2010-09-01 DIAGNOSIS — J4489 Other specified chronic obstructive pulmonary disease: Secondary | ICD-10-CM | POA: Diagnosis present

## 2010-09-01 LAB — DIFFERENTIAL
Basophils Relative: 1 % (ref 0–1)
Eosinophils Absolute: 0.1 10*3/uL (ref 0.0–0.7)
Lymphocytes Relative: 20 % (ref 12–46)
Lymphs Abs: 1.3 10*3/uL (ref 0.7–4.0)
Monocytes Relative: 11 % (ref 3–12)
Neutro Abs: 4.1 10*3/uL (ref 1.7–7.7)

## 2010-09-01 LAB — URINALYSIS, ROUTINE W REFLEX MICROSCOPIC
Glucose, UA: 100 mg/dL — AB
Specific Gravity, Urine: 1.029 (ref 1.005–1.030)
pH: 5.5 (ref 5.0–8.0)

## 2010-09-01 LAB — BASIC METABOLIC PANEL
BUN: 24 mg/dL — ABNORMAL HIGH (ref 6–23)
Chloride: 100 mEq/L (ref 96–112)
Glucose, Bld: 116 mg/dL — ABNORMAL HIGH (ref 70–99)
Potassium: 4.7 mEq/L (ref 3.5–5.1)

## 2010-09-01 LAB — CBC
HCT: 40 % (ref 39.0–52.0)
Hemoglobin: 12.7 g/dL — ABNORMAL LOW (ref 13.0–17.0)
MCV: 88.5 fL (ref 78.0–100.0)
WBC: 6.3 10*3/uL (ref 4.0–10.5)

## 2010-09-01 LAB — BODY FLUID CELL COUNT WITH DIFFERENTIAL
Eos, Fluid: 1 %
Total Nucleated Cell Count, Fluid: 4799 cu mm — ABNORMAL HIGH (ref 0–1000)

## 2010-09-01 LAB — LACTATE DEHYDROGENASE, PLEURAL OR PERITONEAL FLUID: LD, Fluid: 257 U/L — ABNORMAL HIGH (ref 3–23)

## 2010-09-01 LAB — PROTEIN, BODY FLUID: Total protein, fluid: 4.1 g/dL

## 2010-09-01 LAB — LACTATE DEHYDROGENASE: LDH: 179 U/L (ref 94–250)

## 2010-09-01 LAB — PROTEIN, TOTAL: Total Protein: 6.7 g/dL (ref 6.0–8.3)

## 2010-09-01 LAB — GLUCOSE, SEROUS FLUID

## 2010-09-01 MED ORDER — IOHEXOL 300 MG/ML  SOLN
100.0000 mL | Freq: Once | INTRAMUSCULAR | Status: AC | PRN
  Administered 2010-09-01: 100 mL via INTRAVENOUS

## 2010-09-02 ENCOUNTER — Inpatient Hospital Stay (HOSPITAL_COMMUNITY): Payer: 59

## 2010-09-02 ENCOUNTER — Ambulatory Visit: Payer: Self-pay | Admitting: Cardiology

## 2010-09-02 DIAGNOSIS — J90 Pleural effusion, not elsewhere classified: Secondary | ICD-10-CM

## 2010-09-02 DIAGNOSIS — J9600 Acute respiratory failure, unspecified whether with hypoxia or hypercapnia: Secondary | ICD-10-CM

## 2010-09-02 DIAGNOSIS — R509 Fever, unspecified: Secondary | ICD-10-CM

## 2010-09-02 DIAGNOSIS — C3490 Malignant neoplasm of unspecified part of unspecified bronchus or lung: Secondary | ICD-10-CM

## 2010-09-02 LAB — CBC
HCT: 38.3 % — ABNORMAL LOW (ref 39.0–52.0)
Hemoglobin: 11.8 g/dL — ABNORMAL LOW (ref 13.0–17.0)
RBC: 4.34 MIL/uL (ref 4.22–5.81)
WBC: 8.2 10*3/uL (ref 4.0–10.5)

## 2010-09-02 LAB — BASIC METABOLIC PANEL
CO2: 28 mEq/L (ref 19–32)
Calcium: 8.9 mg/dL (ref 8.4–10.5)
Chloride: 102 mEq/L (ref 96–112)
GFR calc Af Amer: >60 mL/min (ref >60)
Glucose, Bld: 101 mg/dL — ABNORMAL HIGH (ref 70–99)
Potassium: 4.3 mEq/L (ref 3.5–5.1)
Sodium: 137 mEq/L (ref 135–145)

## 2010-09-02 LAB — TRIGLYCERIDES, BODY FLUIDS: Triglycerides, Fluid: 30 mg/dL

## 2010-09-02 LAB — BLOOD GAS, ARTERIAL
Acid-Base Excess: 0.6 mmol/L (ref 0.0–2.0)
Drawn by: 103701
TCO2: 22.7 mmol/L (ref 0–100)
pCO2 arterial: 46.9 mmHg — ABNORMAL HIGH (ref 35.0–45.0)
pH, Arterial: 7.361 (ref 7.350–7.450)
pO2, Arterial: 112 mmHg — ABNORMAL HIGH (ref 80.0–100.0)

## 2010-09-02 LAB — MRSA PCR SCREENING: MRSA by PCR: NEGATIVE

## 2010-09-02 LAB — EXPECTORATED SPUTUM ASSESSMENT W GRAM STAIN, RFLX TO RESP C

## 2010-09-02 LAB — PATHOLOGIST SMEAR REVIEW

## 2010-09-02 LAB — CHOLESTEROL, BODY FLUID: Cholesterol, Fluid: 53 mg/dL

## 2010-09-03 ENCOUNTER — Inpatient Hospital Stay (HOSPITAL_COMMUNITY): Payer: 59

## 2010-09-03 LAB — DIFFERENTIAL
Eosinophils Relative: 3 % (ref 0–5)
Lymphocytes Relative: 16 % (ref 12–46)
Monocytes Absolute: 2.5 10*3/uL — ABNORMAL HIGH (ref 0.1–1.0)
Monocytes Relative: 15 % — ABNORMAL HIGH (ref 3–12)

## 2010-09-03 LAB — COMPREHENSIVE METABOLIC PANEL
ALT: 11 U/L (ref 0–53)
AST: 15 U/L (ref 0–37)
Albumin: 2.3 g/dL — ABNORMAL LOW (ref 3.5–5.2)
Alkaline Phosphatase: 104 U/L (ref 39–117)
Glucose, Bld: 104 mg/dL — ABNORMAL HIGH (ref 70–99)
Potassium: 4.7 mEq/L (ref 3.5–5.1)
Sodium: 136 mEq/L (ref 135–145)
Total Protein: 5.7 g/dL — ABNORMAL LOW (ref 6.0–8.3)

## 2010-09-03 LAB — CBC
HCT: 35.2 % — ABNORMAL LOW (ref 39.0–52.0)
Hemoglobin: 10.9 g/dL — ABNORMAL LOW (ref 13.0–17.0)
RDW: 14.8 % (ref 11.5–15.5)
WBC: 16.8 10*3/uL — ABNORMAL HIGH (ref 4.0–10.5)

## 2010-09-03 LAB — URINE CULTURE
Colony Count: NO GROWTH
Culture  Setup Time: 201204082044
Culture: NO GROWTH

## 2010-09-03 LAB — MAGNESIUM: Magnesium: 1.4 mg/dL — ABNORMAL LOW (ref 1.5–2.5)

## 2010-09-04 LAB — CBC
HCT: 33.4 % — ABNORMAL LOW (ref 39.0–52.0)
MCH: 26.8 pg (ref 26.0–34.0)
MCHC: 31.1 g/dL (ref 30.0–36.0)
MCV: 86.1 fL (ref 78.0–100.0)
RDW: 14.8 % (ref 11.5–15.5)

## 2010-09-04 LAB — COMPREHENSIVE METABOLIC PANEL
BUN: 12 mg/dL (ref 6–23)
CO2: 26 mEq/L (ref 19–32)
Calcium: 8.7 mg/dL (ref 8.4–10.5)
GFR calc non Af Amer: >60 mL/min (ref >60)
Glucose, Bld: 108 mg/dL — ABNORMAL HIGH (ref 70–99)
Total Protein: 7 g/dL (ref 6.0–8.3)

## 2010-09-04 LAB — DIFFERENTIAL
Basophils Relative: 0 % (ref 0–1)
Eosinophils Relative: 1 % (ref 0–5)
Lymphs Abs: 2.7 10*3/uL (ref 0.7–4.0)
Monocytes Absolute: 2.3 10*3/uL — ABNORMAL HIGH (ref 0.1–1.0)
Neutro Abs: 15.9 10*3/uL — ABNORMAL HIGH (ref 1.7–7.7)

## 2010-09-04 LAB — MAGNESIUM: Magnesium: 1.5 mg/dL (ref 1.5–2.5)

## 2010-09-04 LAB — CULTURE, RESPIRATORY W GRAM STAIN

## 2010-09-05 LAB — DIFFERENTIAL
Basophils Absolute: 0.1 10*3/uL (ref 0.0–0.1)
Basophils Relative: 0 % (ref 0–1)
Eosinophils Relative: 1 % (ref 0–5)
Lymphocytes Relative: 24 % (ref 12–46)
Neutro Abs: 14.3 10*3/uL — ABNORMAL HIGH (ref 1.7–7.7)

## 2010-09-05 LAB — COMPREHENSIVE METABOLIC PANEL
ALT: 17 U/L (ref 0–53)
AST: 33 U/L (ref 0–37)
Alkaline Phosphatase: 285 U/L — ABNORMAL HIGH (ref 39–117)
Calcium: 9.2 mg/dL (ref 8.4–10.5)
GFR calc Af Amer: >60 mL/min (ref >60)
Glucose, Bld: 132 mg/dL — ABNORMAL HIGH (ref 70–99)
Potassium: 3.3 mEq/L — ABNORMAL LOW (ref 3.5–5.1)
Sodium: 137 mEq/L (ref 135–145)
Total Protein: 6.8 g/dL (ref 6.0–8.3)

## 2010-09-05 LAB — BODY FLUID CULTURE: Culture: NO GROWTH

## 2010-09-05 LAB — MAGNESIUM: Magnesium: 1.6 mg/dL (ref 1.5–2.5)

## 2010-09-05 LAB — CBC
HCT: 36 % — ABNORMAL LOW (ref 39.0–52.0)
Hemoglobin: 11.5 g/dL — ABNORMAL LOW (ref 13.0–17.0)
MCHC: 31.9 g/dL (ref 30.0–36.0)
RDW: 15 % (ref 11.5–15.5)
WBC: 21.9 10*3/uL — ABNORMAL HIGH (ref 4.0–10.5)

## 2010-09-06 ENCOUNTER — Inpatient Hospital Stay (HOSPITAL_COMMUNITY): Payer: 59

## 2010-09-06 LAB — CBC
MCH: 27.2 pg (ref 26.0–34.0)
MCHC: 32 g/dL (ref 30.0–36.0)
MCV: 85 fL (ref 78.0–100.0)
Platelets: 269 10*3/uL (ref 150–400)
RBC: 4.08 MIL/uL — ABNORMAL LOW (ref 4.22–5.81)

## 2010-09-06 LAB — DIFFERENTIAL
Band Neutrophils: 2 % (ref 0–10)
Basophils Absolute: 0.2 10*3/uL — ABNORMAL HIGH (ref 0.0–0.1)
Basophils Relative: 1 % (ref 0–1)
Eosinophils Absolute: 0.4 10*3/uL (ref 0.0–0.7)
Eosinophils Relative: 2 % (ref 0–5)
Lymphocytes Relative: 23 % (ref 12–46)
Lymphs Abs: 4.6 10*3/uL — ABNORMAL HIGH (ref 0.7–4.0)
Monocytes Absolute: 2.2 10*3/uL — ABNORMAL HIGH (ref 0.1–1.0)
Monocytes Relative: 11 % (ref 3–12)
Neutro Abs: 12.8 10*3/uL — ABNORMAL HIGH (ref 1.7–7.7)
Neutrophils Relative %: 61 % (ref 43–77)
Promyelocytes Absolute: 0 %

## 2010-09-06 LAB — BRAIN NATRIURETIC PEPTIDE: Pro B Natriuretic peptide (BNP): 103 pg/mL — ABNORMAL HIGH (ref 0.0–100.0)

## 2010-09-07 ENCOUNTER — Encounter (HOSPITAL_COMMUNITY): Payer: Self-pay | Admitting: Radiology

## 2010-09-07 ENCOUNTER — Inpatient Hospital Stay (HOSPITAL_COMMUNITY): Payer: 59

## 2010-09-07 LAB — CULTURE, BLOOD (ROUTINE X 2)
Culture  Setup Time: 201204082043
Culture: NO GROWTH

## 2010-09-07 LAB — CBC
Hemoglobin: 10.9 g/dL — ABNORMAL LOW (ref 13.0–17.0)
MCH: 27.5 pg (ref 26.0–34.0)
MCHC: 32.2 g/dL (ref 30.0–36.0)
RDW: 14.9 % (ref 11.5–15.5)

## 2010-09-07 MED ORDER — IOHEXOL 300 MG/ML  SOLN: 80.0000 mL | Freq: Once | INTRAMUSCULAR | Status: AC | PRN

## 2010-09-08 LAB — CBC
MCH: 27.6 pg (ref 26.0–34.0)
MCV: 84.8 fL (ref 78.0–100.0)
Platelets: 335 10*3/uL (ref 150–400)
RDW: 15 % (ref 11.5–15.5)
WBC: 21.6 10*3/uL — ABNORMAL HIGH (ref 4.0–10.5)

## 2010-09-08 LAB — PROTIME-INR: Prothrombin Time: 14.4 seconds (ref 11.6–15.2)

## 2010-09-09 ENCOUNTER — Other Ambulatory Visit: Payer: Self-pay | Admitting: Pulmonary Disease

## 2010-09-09 ENCOUNTER — Inpatient Hospital Stay (HOSPITAL_COMMUNITY): Payer: 59

## 2010-09-09 DIAGNOSIS — J159 Unspecified bacterial pneumonia: Secondary | ICD-10-CM

## 2010-09-09 LAB — POCT I-STAT, CHEM 8
BUN: 18 mg/dL (ref 6–23)
Creatinine, Ser: 1.3 mg/dL (ref 0.4–1.5)
Glucose, Bld: 94 mg/dL (ref 70–99)
Hemoglobin: 16.7 g/dL (ref 13.0–17.0)
TCO2: 28 mmol/L (ref 0–100)

## 2010-09-09 LAB — BASIC METABOLIC PANEL
BUN: 10 mg/dL (ref 6–23)
CO2: 27 mEq/L (ref 19–32)
Chloride: 107 mEq/L (ref 96–112)
Creatinine, Ser: 0.99 mg/dL (ref 0.4–1.5)
Glucose, Bld: 106 mg/dL — ABNORMAL HIGH (ref 70–99)
Potassium: 4.2 mEq/L (ref 3.5–5.1)

## 2010-09-10 ENCOUNTER — Other Ambulatory Visit: Payer: Self-pay | Admitting: Pulmonary Disease

## 2010-09-10 LAB — CBC
MCH: 27.3 pg (ref 26.0–34.0)
MCHC: 32.3 g/dL (ref 30.0–36.0)
MCV: 84.6 fL (ref 78.0–100.0)
Platelets: 454 10*3/uL — ABNORMAL HIGH (ref 150–400)

## 2010-09-10 LAB — FUNGAL STAIN: Fungal Smear: NONE SEEN

## 2010-09-10 LAB — PNEUMOCYSTIS JIROVECI SMEAR BY DFA

## 2010-09-11 LAB — CREATININE, SERUM
Creatinine, Ser: 1.02 mg/dL (ref 0.4–1.5)
GFR calc Af Amer: >60 mL/min (ref >60)
GFR calc non Af Amer: >60 mL/min (ref >60)

## 2010-09-11 LAB — VANCOMYCIN, TROUGH: Vancomycin Tr: 19.4 ug/mL (ref 10.0–20.0)

## 2010-09-12 ENCOUNTER — Inpatient Hospital Stay (HOSPITAL_COMMUNITY): Payer: 59

## 2010-09-12 LAB — CBC
HCT: 35.8 % — ABNORMAL LOW (ref 39.0–52.0)
MCH: 27.6 pg (ref 26.0–34.0)
MCHC: 31.8 g/dL (ref 30.0–36.0)
MCV: 85.2 fL (ref 78.0–100.0)
Platelets: 615 10*3/uL — ABNORMAL HIGH (ref 150–400)
Platelets: 644 10*3/uL — ABNORMAL HIGH (ref 150–400)
RDW: 15.2 % (ref 11.5–15.5)
RDW: 15.1 % (ref 11.5–15.5)
WBC: 21 10*3/uL — ABNORMAL HIGH (ref 4.0–10.5)
WBC: 21.7 10*3/uL — ABNORMAL HIGH (ref 4.0–10.5)

## 2010-09-12 LAB — CULTURE, RESPIRATORY W GRAM STAIN

## 2010-09-12 LAB — COMPREHENSIVE METABOLIC PANEL
ALT: 10 U/L (ref 0–53)
AST: 14 U/L (ref 0–37)
Albumin: 2.4 g/dL — ABNORMAL LOW (ref 3.5–5.2)
Alkaline Phosphatase: 228 U/L — ABNORMAL HIGH (ref 39–117)
Calcium: 9.7 mg/dL (ref 8.4–10.5)
GFR calc Af Amer: >60 mL/min (ref >60)
Glucose, Bld: 112 mg/dL — ABNORMAL HIGH (ref 70–99)
Potassium: 5 mEq/L (ref 3.5–5.1)
Sodium: 140 mEq/L (ref 135–145)
Total Protein: 6.7 g/dL (ref 6.0–8.3)

## 2010-09-13 LAB — DIFFERENTIAL
Basophils Relative: 1 % (ref 0–1)
Eosinophils Absolute: 0 10*3/uL (ref 0.0–0.7)
Eosinophils Relative: 2 % (ref 0–5)
Eosinophils Relative: 0 % (ref 0–5)
Lymphocytes Relative: 17 % (ref 12–46)
Lymphocytes Relative: 18 % (ref 12–46)
Lymphs Abs: 3.8 10*3/uL (ref 0.7–4.0)
Monocytes Relative: 12 % (ref 3–12)
Myelocytes: 0 %
Neutro Abs: 17.4 10*3/uL — ABNORMAL HIGH (ref 1.7–7.7)
Neutrophils Relative %: 78 % — ABNORMAL HIGH (ref 43–77)
Neutrophils Relative %: 69 % (ref 43–77)
Promyelocytes Absolute: 0 %
nRBC: 0 /100 WBC

## 2010-09-13 LAB — CBC
HCT: 37.3 % — ABNORMAL LOW (ref 39.0–52.0)
HCT: 35.4 % — ABNORMAL LOW (ref 39.0–52.0)
Hemoglobin: 12.1 g/dL — ABNORMAL LOW (ref 13.0–17.0)
Hemoglobin: 11.5 g/dL — ABNORMAL LOW (ref 13.0–17.0)
MCV: 85.3 fL (ref 78.0–100.0)
Platelets: 765 10*3/uL — ABNORMAL HIGH (ref 150–400)
RBC: 4.31 MIL/uL (ref 4.22–5.81)
RBC: 4.15 MIL/uL — ABNORMAL LOW (ref 4.22–5.81)
WBC: 22.3 10*3/uL — ABNORMAL HIGH (ref 4.0–10.5)
WBC: 21.1 10*3/uL — ABNORMAL HIGH (ref 4.0–10.5)

## 2010-09-13 LAB — BASIC METABOLIC PANEL
CO2: 30 mEq/L (ref 19–32)
Calcium: 9.3 mg/dL (ref 8.4–10.5)
GFR calc Af Amer: >60 mL/min (ref >60)
Sodium: 139 mEq/L (ref 135–145)

## 2010-09-13 LAB — COMPREHENSIVE METABOLIC PANEL
ALT: 10 U/L (ref 0–53)
Alkaline Phosphatase: 238 U/L — ABNORMAL HIGH (ref 39–117)
BUN: 12 mg/dL (ref 6–23)
CO2: 28 mEq/L (ref 19–32)
Calcium: 9.8 mg/dL (ref 8.4–10.5)
GFR calc non Af Amer: >60 mL/min (ref >60)
Glucose, Bld: 111 mg/dL — ABNORMAL HIGH (ref 70–99)
Sodium: 140 mEq/L (ref 135–145)
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
## 2010-09-15 LAB — LEGIONELLA PROFILE(CULTURE+DFA/SMEAR)

## 2010-09-15 NOTE — Discharge Summary (Signed)
  NAMEREEDER, PROA            ACCOUNT NO.:  [id number]  MEDICAL RECORD NO.:  [id number]           PATIENT TYPE:  I  LOCATION:  [id number]                         FACILITY:  [facility]  PHYSICIAN:  Verneita Griffes, MD        DATE OF BIRTH:  18-Nov-1950  DATE OF ADMISSION:  09/01/2010 DATE OF DISCHARGE:                              DISCHARGE SUMMARY   ADDENDUM  The patient was discharged home on limited prescription of OxyIR 5 mg q.6 p.r.n.  He was given a limited prescription for 20 tablets to take q.6 p.r.n. for 5 days.          ______________________________ Verneita Griffes, MD     JS/MEDQ  D:  09/14/2010  T:  09/14/2010  Job:  [id number]  Electronically Signed by Verneita Griffes MD on 09/15/2010 12:43:18 PM

## 2010-09-15 NOTE — Discharge Summary (Signed)
NAMEKENETH, [name]            ACCOUNT NO.:  [id number]  MEDICAL RECORD NO.:  [id number]           PATIENT TYPE:  I  LOCATION:  [id number]                         FACILITY:  [facility]  PHYSICIAN:  Verneita Griffes, MD        DATE OF BIRTH:  06-18-1950  DATE OF ADMISSION:  09/01/2010 DATE OF DISCHARGE:                              DISCHARGE SUMMARY   ADDENDUM: This is an addendum to dictation number [id number] by Dr. [place] Pink.  DISCHARGE DIAGNOSES: 1. Pneumonia. 2. History of small cell carcinoma. 3. Status post coronary artery bypass grafting. 4. Herpes simplex 5. Increased alkaline phosphatase likely secondary to history of small     cell carcinoma. 6. Status post cervical spine surgery. 7. Bilateral shoulder surgery. 8. Hyperkalemia.  DISCHARGE MEDICATIONS:  As follows: 1. Asmanex 220 1 puff b.i.d. 2. Adderall 30 mg 1 tablet b.i.d. p.r.n. 3. Amphetamine/dextroamphetamine 4. Gabapentin 600 mg 1 tablet q.h.s. 5. Trazodone 100 mg 1 tablet q.h.s. 6. Atrovent 2 puffs b.i.d. 7. Clonazepam 0.5 mg p.o. b.i.d. 8. Metoprolol XL 25 half tablet q.h.s. 9. Senokot-S 2 tablets b.i.d. 10.Guaifenesin XR 600 mg 1 tablet b.i.d. 11.Lipitor 80 mg 1 tablet q.h.s. 12.Percocet 10/325 q.8 p.r.n. 13.Oxycodone 5 mg 1 to 3 tablets q.3 p.r.n. 14.Aspirin 81 mg 1 tablet daily. 15.Meloxicam 15 mg 1 tablet q.a.m. 16.Sildenafil 1 tablet daily p.r.n. 17.Reglan 10 mg 1 tablet q.6 hourly. 18.Omeprazole 20 mg 1 capsule daily. 19.Zyrtec 10 mg 1 tablet q.a.m. 20.Ventolin inhaler 2 puffs q.4 p.r.n. 21.Dulera 2 puffs b.i.d. 22.Opana ER 40 mg 1 tablet q.12 hourly. 23.Vitamin D3 400 units p.o. q.a.m.  Chest x-ray done September 12, 2010, showed: 1. Slight improvement on right-sided airspace disease, likely     infection. 2. Similar moderate right-sided pleural effusion.  HOSPITAL COURSE:  Summarizing September 10, 2010 to September 11, 2010 to September 14, 2010 1. Pneumonia.  The patient was continued on antibiotics until  reviewed     by Dr. Annamaria Boots, Pulmonology.  He had completed 7 days of Primaxin and     7 days of vancomycin.  It was thought that with his splenectomy he     may have an artificially elevated WBC count, however, my new count     did confirm that he did indeed still has leukocytosis.  I am     unclear whether this is secondary to his oncological process or     not, more likely this is infection but the patient was afebrile and     has no symptoms, in fact his cough completely improved.  As such,     he was discontinued off antibiotics even though his WBC was still     in the 20s.  The patient states to me he was given "Booster" shot     by his oncologist because of chemotherapy that he was on.  As such,     this may artificially also have elevated his white count.  The     patient will follow up with primary care physician as an outpatient     to determine further course of care with Dr. Edilia Bo.  He may need  a further CBC in 1 to 2 weeks. 2. History of small cell carcinoma.  The patient adamantly refuses     chemotherapy.  He states he has been given chemotherapy x1 and did     not like the treatment and pathology report from bronchoalveolar     lavage done September 11, 2010, showed no specific malignancy.  As     such, although I have tried to contact Dr. [name], it appeared     that he is out of office.  The patient states he will follow up     with him and thinks it is reasonable to do so and further option of     therapy can be explored then. 3. Status post herpes simplex.  The patient was given the course of     acyclovir that he finished on September 12, 2010, be completed 3- to 5-     day course. 4. Status post CABG.  The patient's heart issues are very stable while     in hospital.  He was in fact kept off telemetry given he was so     stable, did not experience any chest pain.  He will continue on his     prior medications as dictated above. 5. Status post multiple surgeries  including cervical spine and     bilateral shoulder surgery.  This is noted to be stable.  He will     continue on his chronic pain medications.  I have not prescribed     any new medications for him and he will need to contact Dr.     Edilia Bo for these medications on discharge. 6. Elevation of alkaline phosphatase.  This is thought to be secondary     to his cancerous process.  The patient will need to follow up with     once again oncologist for further workup of this. 7. Hyperkalemia.  He was noted to have slightly elevated potassium at     5.1, however, on discharge this was decreased to 4.4.  The patient was discharged home in stable state.  He was doing well.  He stated he had less cough on day of discharge.  He was getting up and moving around.  He is anxious to go home.  He experienced no chest pain, no shortness of breath, no sputum, no fever.  Temperature was 98.2, pulse was 73, respirations were 18, blood pressure 108 to 113 over 62 to 99, and 97% on room air.  LABORATORY DATA ON DISCHARGE:  Sodium 139, potassium 4.4, chloride 103, CO2 of 30, BUN 14, creatinine 0.98.  WBC 21.1, hemoglobin 11.5, hematocrit 35.4, platelet count was 765.  It was pleasure taking care of this patient.  The patient was discharged home in stable state.          ______________________________ Verneita Griffes, MD     JS/MEDQ  D:  09/14/2010  T:  09/14/2010  Job:  [id number]  Electronically Signed by Verneita Griffes MD on 09/15/2010 12:43:39 PM

## 2010-09-16 ENCOUNTER — Ambulatory Visit (INDEPENDENT_AMBULATORY_CARE_PROVIDER_SITE_OTHER): Payer: 59 | Admitting: Family Medicine

## 2010-09-16 ENCOUNTER — Encounter: Payer: Self-pay | Admitting: Family Medicine

## 2010-09-16 DIAGNOSIS — J209 Acute bronchitis, unspecified: Secondary | ICD-10-CM

## 2010-09-16 NOTE — Group Therapy Note (Signed)
[name], [name]            ACCOUNT NO.:  [id number]  MEDICAL RECORD NO.:  [id number]           PATIENT TYPE:  I  LOCATION:  [id number]                         FACILITY:  [facility]  PHYSICIAN:  Annita Brod, M.D.DATE OF BIRTH:  [birth year]                                PROGRESS NOTE   PRIMARY CARE PHYSICIAN: Spencer  T. Copland, M.D., [place], [place].  ONCOLOGIST: Fanny Bien. [name], M.D.  CARDIOLOGIST: Marijo Conception. Wall, M.D., F.A.C.C.  HOSPITAL COURSE: The patient is a 60 year old, white male with a past history of small- cell lung cancer status post lobectomy and status post his first dose of chemotherapy a week prior to admission as well as a history of CAD and CABG who presented with complaints of fever.  He was noted to have a temperature of 101.5.  When he came into the emergency room, he was noted to have a complete collapse of right lower lobe concerning secondary bronchial obstruction, mild pulmonary vascular congestion.  He was started on IV Rocephin and Zithromax and admitted.  By hospital day #2, his white blood cell count stayed stable.  He was starting to feel better.  However, over the next several days, his white blood cell count started to trend up from 8 up to 16 peaking at 21 on April 11.  At this point, antibiotics were adjusted, and CCM was consulted to follow up on the patient back on April 9 because of associated pleural effusion.  The patient underwent a thoracentesis on the evening of April 8.  He is going to be extubated by LDH protein criteria.  Given his elevated white count, his antibiotics Rocephin and Zithromax were broad-spectrum on April 12.  Over the next several days, the patient's white count stayed between 20 and 21 without any significant decline.  Follow-up chest x- rays done on April 13 showed progression of right lung pneumonia.  A follow-up chest CT done on April 14 noted air space disease on the right, worsening now involving  all lobes most consistent with a progressive pneumonia.  Pulmonary medicine was asked to resee the patient, and they noted progressive infiltrate and leukocytosis.  They felt given his broad-spectrum antibiotics that his infection was unlikely.  The fact it was unilateral was harder to explain as a side effect of chemotherapy, unless it is a combination of drug effect and lymphangitic cancer.  The patient was noted not to have x-ray therapy. Dr. Earlie Server was asked to follow up to clarify what care regimen the patient has been on, and the patient was also advised that he go for a bronchoscopy.  Bronchoscopy was performed on September 09, 2010 and noted to have chronic airway changes, right middle and lower lobe were surgically absent.  A BAL and brushings from the anterior segment of the right upper lobe were done.  TB biopsy x4 was requested and currently final results are pending. Labs back from the patient's previous day bronchoscopy noted no yeast or fungal elements seen for fungal smear. Legionella profile was negative.  Respiratory culture shows no growth. Pneumocystis study was negative as well.  The patient's white count on April 17 is  actually increased up to 22 now at this point.  At this point, now the patient has been on vancomycin and Primaxin for the past 7 days, and prior to this, Rocephin and Zithromax for the 4 days prior to that.  The plan will be to await findings from bronchoscopy studies to determine what further intervention needs to be done to improve the patient's pneumonia.  We are also waiting for Oncology to refer for clarification on chemotherapy regimen.  In regard to the patient's other medical issues, he had a flare-up of his HSV leading to some secondary gingivostomatitis.  He has been started on acyclovir, which he has been tolerating well.  The final dose will be due on April 19.  His symptoms have dramatically improved.  In regards to his chronic back  pain, initially the patient had some issues with respiratory suppression while continuing his home doses of narcotic medications plus IV plus his pneumonia.  All these medicines were stopped.  He started having some increased episodes of pain.  His pain was not well-controlled as of April 17, and we are going to start back on his Opana twice a day for chronic pain relief and changed to my OxyIR five q.4 for interim breakthrough pain.  He has also had some secondary constipation with narcotics, and we will put him on a bowel regimen of Colace and laxative.  His heart issues were stable during this hospitalization.     Annita Brod, M.D.     SKK/MEDQ  D:  09/10/2010  T:  09/10/2010  Job:  [id number]  Electronically Signed by Gevena Barre M.D. on 09/16/2010 02:06:05 PM

## 2010-09-17 ENCOUNTER — Other Ambulatory Visit: Payer: Self-pay | Admitting: Internal Medicine

## 2010-09-17 ENCOUNTER — Encounter (HOSPITAL_BASED_OUTPATIENT_CLINIC_OR_DEPARTMENT_OTHER): Payer: 59 | Admitting: Internal Medicine

## 2010-09-17 DIAGNOSIS — I10 Essential (primary) hypertension: Secondary | ICD-10-CM

## 2010-09-17 DIAGNOSIS — D72829 Elevated white blood cell count, unspecified: Secondary | ICD-10-CM

## 2010-09-17 DIAGNOSIS — C3490 Malignant neoplasm of unspecified part of unspecified bronchus or lung: Secondary | ICD-10-CM

## 2010-09-17 DIAGNOSIS — D751 Secondary polycythemia: Secondary | ICD-10-CM

## 2010-09-17 DIAGNOSIS — Z79899 Other long term (current) drug therapy: Secondary | ICD-10-CM

## 2010-09-17 DIAGNOSIS — K219 Gastro-esophageal reflux disease without esophagitis: Secondary | ICD-10-CM

## 2010-09-17 LAB — CBC WITH DIFFERENTIAL/PLATELET
Eosinophils Absolute: 0.2 10*3/uL (ref 0.0–0.5)
LYMPH%: 20.7 % (ref 14.0–49.0)
MONO#: 1.5 10*3/uL — ABNORMAL HIGH (ref 0.1–0.9)
NEUT#: 11.9 10*3/uL — ABNORMAL HIGH (ref 1.5–6.5)
Platelets: 835 10*3/uL — ABNORMAL HIGH (ref 140–400)
RBC: 4.1 10*6/uL — ABNORMAL LOW (ref 4.20–5.82)
RDW: 15.3 % — ABNORMAL HIGH (ref 11.0–14.6)
WBC: 17.2 10*3/uL — ABNORMAL HIGH (ref 4.0–10.3)

## 2010-09-17 LAB — COMPREHENSIVE METABOLIC PANEL
Albumin: 3.7 g/dL (ref 3.5–5.2)
CO2: 24 mEq/L (ref 19–32)
Chloride: 101 mEq/L (ref 96–112)
Glucose, Bld: 83 mg/dL (ref 70–99)
Potassium: 4.7 mEq/L (ref 3.5–5.3)
Sodium: 138 mEq/L (ref 135–145)
Total Protein: 7.4 g/dL (ref 6.0–8.3)

## 2010-09-17 NOTE — Assessment & Plan Note (Signed)
   Complete physical exam  Patient: [name]   DOB: 03/15/1999   60 y.o. Male  MRN: [id number]  Subjective:    No chief complaint on file.   [name] is a 60 y.o. male who presents today for a complete physical exam. She reports consuming a {diet types:17450} diet. {types:19826} She generally feels {DESC; WELL/FAIRLY WELL/POORLY:18703}. She reports sleeping {DESC; WELL/FAIRLY WELL/POORLY:18703}. She {does/does not:200015} have additional problems to discuss today.    Most recent fall risk assessment:    11/20/2021   10:42 AM  Fall Risk   Falls in the past year? 0  Number falls in past yr: 0  Injury with Fall? 0  Risk for fall due to : No Fall Risks  Follow up Falls evaluation completed     Most recent depression screenings:    11/20/2021   10:42 AM 10/11/2020   10:46 AM  PHQ 2/9 Scores  PHQ - 2 Score 0 0  PHQ- 9 Score 5     {VISON DENTAL STD PSA (Optional):27386}  {History (Optional):23778}  Patient Care Team: Jessup, Joy, NP as PCP - General (Nurse Practitioner)   Outpatient Medications Prior to Visit  Medication Sig   fluticasone (FLONASE) 50 MCG/ACT nasal spray Place 2 sprays into both nostrils in the morning and at bedtime. After 7 days, reduce to once daily.   norgestimate-ethinyl estradiol (SPRINTEC 28) 0.25-35 MG-MCG tablet Take 1 tablet by mouth daily.   Nystatin POWD Apply liberally to affected area 2 times per day   spironolactone (ALDACTONE) 100 MG tablet Take 1 tablet (100 mg total) by mouth daily.   No facility-administered medications prior to visit.    ROS        Objective:     There were no vitals taken for this visit. {Vitals History (Optional):23777}  Physical Exam   No results found for any visits on 12/26/21. {Show previous labs (optional):23779}    Assessment & Plan:    Routine Health Maintenance and Physical Exam  Immunization History  Administered Date(s) Administered   DTaP 05/29/1999, 07/25/1999,  10/03/1999, 06/18/2000, 01/02/2004   Hepatitis A 10/29/2007, 11/03/2008   Hepatitis B 03/16/1999, 04/23/1999, 10/03/1999   HiB (PRP-OMP) 05/29/1999, 07/25/1999, 10/03/1999, 06/18/2000   IPV 05/29/1999, 07/25/1999, 03/23/2000, 01/02/2004   Influenza,inj,Quad PF,6+ Mos 02/03/2014   Influenza-Unspecified 05/05/2012   MMR 03/23/2001, 01/02/2004   Meningococcal Polysaccharide 11/03/2011   Pneumococcal Conjugate-13 06/18/2000   Pneumococcal-Unspecified 10/03/1999, 12/17/1999   Tdap 11/03/2011   Varicella 03/23/2000, 10/29/2007    Health Maintenance  Topic Date Due   HIV Screening  Never done   Hepatitis C Screening  Never done   INFLUENZA VACCINE  12/24/2021   PAP-Cervical Cytology Screening  12/26/2021 (Originally 03/14/2020)   PAP SMEAR-Modifier  12/26/2021 (Originally 03/14/2020)   TETANUS/TDAP  12/26/2021 (Originally 11/02/2021)   HPV VACCINES  Discontinued   COVID-19 Vaccine  Discontinued    Discussed health benefits of physical activity, and encouraged her to engage in regular exercise appropriate for her age and condition.  Problem List Items Addressed This Visit   None Visit Diagnoses     Annual physical exam    -  Primary   Cervical cancer screening       Need for Tdap vaccination          No follow-ups on file.     Joy Jessup, NP   

## 2010-09-17 NOTE — Progress Notes (Signed)
  Subjective:    Patient ID: [name], male    DOB: 1951/03/27, 60 y.o.   MRN: [id number]  HPIno show.     Review of Systems     Objective:   Physical Exam        Assessment & Plan:

## 2010-09-19 ENCOUNTER — Ambulatory Visit (INDEPENDENT_AMBULATORY_CARE_PROVIDER_SITE_OTHER): Payer: 59 | Admitting: Family Medicine

## 2010-09-19 ENCOUNTER — Telehealth: Payer: Self-pay | Admitting: *Deleted

## 2010-09-19 ENCOUNTER — Encounter: Payer: Self-pay | Admitting: Family Medicine

## 2010-09-19 ENCOUNTER — Ambulatory Visit (INDEPENDENT_AMBULATORY_CARE_PROVIDER_SITE_OTHER)
Admission: RE | Admit: 2010-09-19 | Discharge: 2010-09-19 | Disposition: A | Discharge status: Home / Self Care | Payer: 59 | Source: Ambulatory Visit | Attending: Family Medicine | Admitting: Family Medicine

## 2010-09-19 ENCOUNTER — Telehealth: Payer: Self-pay | Admitting: Internal Medicine

## 2010-09-19 VITALS — BP 110/74 | HR 104 | Temp 99.0°F | Wt 158.1 lb

## 2010-09-19 DIAGNOSIS — R05 Cough: Secondary | ICD-10-CM

## 2010-09-19 DIAGNOSIS — R059 Cough, unspecified: Secondary | ICD-10-CM

## 2010-09-19 DIAGNOSIS — C3490 Malignant neoplasm of unspecified part of unspecified bronchus or lung: Secondary | ICD-10-CM

## 2010-09-19 NOTE — Progress Notes (Signed)
A little more SOB per patient.  "It was like this before I came home [facility], but it's a little worse now."  No FCNAV.  Occ cough and yellow sputum.  No more fluid in legs.  WBC improved yesterday from the prev inpatient value.  S/p pleural tap in [facility].   Pain controlled fairly well.    Prev onc notes and inpatient notes reviewed [id number] CA and pleural tap.  Meds, vitals, and allergies reviewed.   ROS: See HPI.  Otherwise, noncontributory.  nad Ncat, bald except for patch of hair on posterior scalp Mmm rrr No focal dec in bs, occ scattered ronchi, no inc in wob abd soft, not ttp Ext w/o edema  cxr reviewed

## 2010-09-19 NOTE — Telephone Encounter (Signed)
lmomtcb x1 

## 2010-09-19 NOTE — Telephone Encounter (Signed)
Please try to get the notes from that visit.  I'll see the patient this afternoon.  Thanks.

## 2010-09-19 NOTE — Patient Instructions (Signed)
Schedule a follow up appointment in 10-14 days with Dr. Lorelei Pont. Don't change you meds in the meantime.  Take care.

## 2010-09-19 NOTE — Telephone Encounter (Signed)
Spoke with wife, she says that he saw Dr. [name], his oncologist on the 24 th. Wife was really wanting a chest xray done, but they didn't do one there. She says that he is really short of breath and just feels terrible.

## 2010-09-19 NOTE — Assessment & Plan Note (Addendum)
cxr reviewed. He has no acute changes and some of the air space is improved from prev.  I wouldn't change anything now, but I'd have him fu with PMD.  He agrees.  Okay for outpatient fu.

## 2010-09-19 NOTE — Telephone Encounter (Signed)
Received notes. Given to [place].

## 2010-09-20 ENCOUNTER — Encounter: Payer: Self-pay | Admitting: Family Medicine

## 2010-09-20 NOTE — Telephone Encounter (Signed)
lmomtcb x 2  

## 2010-09-23 NOTE — Telephone Encounter (Signed)
Called both #s provided - LMOMCTB

## 2010-09-25 ENCOUNTER — Telehealth: Payer: Self-pay | Admitting: Internal Medicine

## 2010-09-25 NOTE — Telephone Encounter (Signed)
Per spouse pt needs 2 week hfu for pt w/ Dr. Annamaria Boots. Pt was d/c'd 4/21. Pt has never been seen here. Please advise Dr. Annamaria Boots for a work in spot. Thanks  Charma Igo, [place]

## 2010-09-25 NOTE — Telephone Encounter (Signed)
lmomtcb x1 

## 2010-09-25 NOTE — Telephone Encounter (Signed)
Please let patient know we can work him in on Thursday 10-10-2010 at 11am. I have put appt in EPIC.

## 2010-09-25 NOTE — Telephone Encounter (Signed)
lmomtcb x4 advised pt we have been trying to contact him for several days and had no response. Left message stating if he still needed [place] for anything to call back and we would be more than happy to care of him. Will sign off message we have tried to call pt x4

## 2010-09-26 NOTE — Telephone Encounter (Signed)
Per CDY-he has a PCP that he can visit in the meantime of his appt with CDY.

## 2010-09-26 NOTE — Telephone Encounter (Signed)
Spouse is aware of upcoming HFU on 5/17 w/ CDY but is requesting a sooner appt if possible. She states pt's anxiety level is very high and he is having increased SOB. His inhalers and nebulizer only help for a short time. Pls advise.No Known Allergies

## 2010-09-26 NOTE — Telephone Encounter (Signed)
Lm for pt/wife to call back

## 2010-09-26 NOTE — Telephone Encounter (Signed)
lmomtcb x1. Called work number and [name] has left for the day

## 2010-09-26 NOTE — Telephone Encounter (Signed)
Wife calling back.  [phone] Asking for call back asap states husband has some issues going on.

## 2010-09-27 NOTE — Telephone Encounter (Signed)
LMTCB x2  

## 2010-09-30 NOTE — Op Note (Signed)
  [name], [name]            ACCOUNT NO.:  [id number]  MEDICAL RECORD NO.:  [id number]           PATIENT TYPE:  I  LOCATION:  [place]                         FACILITY:  [facility]  PHYSICIAN:  Zella Richer. Alva Garnet, MD   DATE OF BIRTH:  02/19/1951  DATE OF PROCEDURE:  09/10/2010 DATE OF DISCHARGE:  09/14/2010                              OPERATIVE REPORT   INDICATION: 1. History of lung cancer, status post lobectomy. 2. Bilateral pulmonary infiltrates, rule out infection versus     recurrent malignancy.  ANESTHESIA:  Topical anesthesia was applied to the nose and throat, and 30 mL of 1% lidocaine were used during the course of procedure.  SEDATION:  Fentanyl 50 mcg IV, Versed 4 mg IV.  PROCEDURE:  After adequate sedation and anesthesia, the bronchoscope was introduced via the right naris and advanced to the posterior pharynx. This demonstrated a normal upper airway anatomy.  Vocal cords moved symmetrically.  Upper airway anesthesia was achieved with 1% lidocaine, and the scope was advanced through the vocal cords into the trachea. Complete airway anesthesia was achieved with 1% lidocaine and an airway examination was performed.  This demonstrated surgical absence of the right middle lobe and right lower lobe.  Otherwise, segmental airway anatomy was normal.  Airway mucosa revealed changes consistent with chronic bronchitis.  There were diffuse thin mucoid secretions throughout.  There were no foreign bodies, masses, or endobronchial tumors noted.  After completion of the of the airway examination, the bronchoscope was advanced into the anterior segment of the right upper lobe where a bronchoalveolar lavage and cytology brushing samples were obtained. Next, transbronchial biopsies were obtained from the posterior segment of the right upper lobe.  Four passes were made with a trans bronchial biopsy forceps.  The patient tolerated the procedure well without complications.  There was  only minimal bleeding.  The bronchoscope was removed, and the patient was returned to the recovery room subsequently to his hospital bed without any late complications. The above specimens were sent for fungal, AFB cultures.  Also, cytology and surgical pathology was requested on the above samples.     Zella Richer Alva Garnet, MD     DBS/MEDQ  D:  09/19/2010  T:  09/19/2010  Job:  [id number]  Electronically Signed by Merton Border MD on 09/30/2010 04:23:48 AM

## 2010-09-30 NOTE — Telephone Encounter (Signed)
Called and spoke with pt's wife and informed her of CY's recs.  Wife verbalized understanding but stated she was still concerned about how late the appt date is.  Recommended wife can call us back and check with the schedule to see if there are any cancellations for pt to be seen sooner.  Pt was ok with this and will check back with [place] to see if we have any cancelled appts for pt to be seen sooner.

## 2010-10-02 NOTE — H&P (Signed)
[name], [name]            ACCOUNT NO.:  [id number]  MEDICAL RECORD NO.:  [id number]           PATIENT TYPE:  E  LOCATION:  WLED                         FACILITY:  [facility]  PHYSICIAN:  Debbe Odea, M.D.     DATE OF BIRTH:  16-Nov-1950  DATE OF ADMISSION:  09/01/2010 DATE OF DISCHARGE:                             HISTORY & PHYSICAL   PRIMARY CARE PHYSICIAN:  Spencer  T. Copland, MD with [facility].  ONCOLOGIST:  Fanny Bien. [name], MD  CARDIOLOGIST:  Marijo Conception. Wall, MD, [facility]  PRESENTING COMPLAINT:  Fever.  HISTORY OF PRESENT ILLNESS:  This is a 60 year old male with a diagnosis of small-cell lung cancer who is status post partial lobectomy and received his first chemo about 1 week ago.  The patient also has a history of COPD, coronary artery disease status post CABG, and multiple back surgeries.  The patient comes in with a complaint of fever.  He was having chills this morning and when his wife checked his temperature it was 101.5.  He does recall having 3 episodes of vomiting through the night but prior to today was overall feeling normal.  He did have his chemo for the first time a week ago and has felt some generalized weakness since then but had not had any other symptoms.  He usually has a cough which is productive of white sputum but yesterday he noticed it was yellow and today he noticed it was yellow with a pink tinge.  He does not complain of any chest pain.  He does have a mild sore throat.  He has a runny nose which is not new for him.  PAST MEDICAL HISTORY: 1. COPD. 2. MI and coronary artery disease status post CABG. 3. Lung cancer.  He believes it is small cell.  He has had a right     lower lobe partial lobectomy which was done on March 14 at [facility] and     he has just start a receiving chemo with Dr. [name]. 4. Cervical spine surgery x3. 5. Bilateral shoulder surgeries.  He has had his right shoulder     operated on twice.  SOCIAL  HISTORY:  He smoked since he was 60 years old, was smoking about 2 packs a day in his 62s and 44s.  He stopped smoking in 2010.  He was a heavy drinker but he stopped this as well.  He also used to use marijuana.  He was working in [employer].  He now is not working.  He lives with his wife who works in the lab at [employer]:  Father had heart trouble.  Sister had cancer which was metastatic.  Brother had a cancer which originated in his cheek  ALLERGIES:  No known drug allergies.  HOME MEDICATIONS:  Per med rec he is on the following: 1. Amphetamine/dextroamphetamine 30 mg 1 tablet twice a day which he     only takes as needed. 2. Oxycodone 5 mg every 3 hours as needed 1-3 tablets. 3. Metoclopramide 10 mg q.6 h. as needed. 4. Senokot-S 2 tablets twice a day.  He only  takes it at bedtime. 5. Oxycodone/acetaminophen 10/325 1 tablet q.8 h. as needed. 6. Vitamin D3 400 units daily. 7. Meloxicam 15 mg, he takes it at bedtime. 8. Sildenafil 1 tablet as needed. 9. Clonazepam 0.5 mg twice a day as needed. 10.Omeprazole 20 mg daily. 11.Trazodone 100 mg daily at bedtime. 12.Metoprolol XL 25 mg 1/2 tablet daily at bedtime. 13.Zyrtec 10 mg daily. 14.Opana ER 40 mg every 12 hours. 15.Gabapentin 600 mg daily at bedtime. 16.Lipitor 80 mg daily at bedtime. 17.Aspirin 81 mg daily. 18.Guaifenesin XR 600 mg twice a day. 19.Atrovent inhaler 2 puffs twice a day. 20.Ventolin inhaler 2 puffs every 4 hours as needed. 21.Dulera 200/5 mcg 2 puffs twice a day. 22.Asmanex 220,000,000 mcg 1 puff twice a day.  REVIEW OF SYSTEMS:  He has had some mild amount of weight loss.  No frequent headaches.  HEENT: No blurred vision, double vision.  He has had a mild sore throat.  He has a runny nose which is chronic. No sinusitis.  No earache.  RESPIRATORY: Positive for shortness of breath on exertion and cough.  CARDIAC:  No chest pain, palpitations, or pedal edema.  GI: Vomiting last night.  No  abdominal pain.  No diarrhea or constipation.  GU:  No dysuria or hematuria.  HEMATOLOGIC:.  No easy bruising.  SKIN:  No rash.  MUSCULOSKELETAL:  No joint pain or back pain.  NEUROLOGIC:  No focal numbness, weakness, tingling.  No history of strokes or seizures.  PSYCHOLOGIC:  Has anxiety and depression.  PHYSICAL EXAMINATION:  GENERAL:  A middle-aged male sitting up in bed in no acute distress. VITAL SIGNS:  Blood pressure 96/60, pulse 95, respiratory rate 21, temperature 98.4, oxygen saturation is 96% on room air. HEENT:  Pupils equal, round, reactive to light.  Extraocular movements are intact.  Conjunctivae are pink.  No scleral icterus.  Oral mucosa is moist. NECK:  Supple.  No thyromegaly, lymphadenopathy, carotid bruits. HEART:  Regular rate and rhythm.  No murmurs, rubs, or gallops. LUNGS:  Clear bilaterally.  Normal respiratory effort.  No use of accessory muscles. ABDOMEN:  Soft, nontender, nondistended.  Bowel sounds positive.  No organomegaly. EXTREMITIES:  No cyanosis, clubbing or edema.  Pedal pulses positive. NEUROLOGIC:  Cranial nerves II-XII intact.  Strength intact in all 4 extremities. PSYCHOLOGIC:  Awake, alert, oriented x3.  Mood and affect normal. SKIN:  Warm, dry.  No rash or bruising.  BLOOD WORK:  Hemoglobin is 12.7, hematocrit 40.  Rest of his CBC is normal.  Sodium is slightly low at 133.  Rest of his metabolic panel appears to be normal.  Chest x-ray, 2-view reveals complete collapse of the right lower lobe possibly due to bronchial obstruction by tumor.  There is mild pulmonary vascular congestion in the right upper lobe.  There is a moderate right- sided pleural effusion.  Right decubitus chest x-ray shows moderate size mobile right pleural effusion and dense right lower lobe atelectasis or pneumonia.  Left decubitus x-ray again reveals right pleural effusion and right lower lobe atelectasis or pneumonia.  There is also minimal nonmobile left  pleural effusion or thickening.  ASSESSMENT AND PLAN: 1. Right lower lobe consolidation with effusion and collapse possibly     secondary to obstruction with development of a post-obstructive     pneumonia as he did have a fever earlier this morning.  He has been     started on Rocephin and Zithromax which I will continue.  I will     obtain  a CT of his chest and a pulmonary consult.  He currently     does not require oxygen at rest. 2. Lung cancer currently being treated by Dr. [name].  We will     consult him. 3. Coronary artery disease status post CABG. 4. Mild anemia. 5. Vomiting which appears to have resolved today. 6. The patient would like to be a full code. 7. DVT prophylaxis with Lovenox.  Time on admission was 55 minutes.     Debbe Odea, M.D.     SR/MEDQ  D:  09/01/2010  T:  09/01/2010  Job:  [id number]  cc:   Kathryne Eriksson, MD  Electronically Signed by Debbe Odea M.D. on 10/02/2010 11:20:13 PM

## 2010-10-03 ENCOUNTER — Telehealth: Payer: Self-pay | Admitting: *Deleted

## 2010-10-03 MED ORDER — NYSTATIN 100000 UNIT/ML MT SUSP: OROMUCOSAL | Status: DC

## 2010-10-03 NOTE — Telephone Encounter (Signed)
I sent in some Nystatin suspension. That will kill thrush. (I think that is what they meant. Magic MW only numbs the mouth and throat)

## 2010-10-03 NOTE — Telephone Encounter (Signed)
[name] says that patient has thrush and they are requesting magic mouthwash to be called in to [facility].

## 2010-10-07 ENCOUNTER — Ambulatory Visit: Payer: 59 | Admitting: Family Medicine

## 2010-10-07 DIAGNOSIS — Z0289 Encounter for other administrative examinations: Secondary | ICD-10-CM

## 2010-10-07 LAB — FUNGUS CULTURE W SMEAR: Fungal Smear: NONE SEEN

## 2010-10-08 ENCOUNTER — Encounter: Payer: Self-pay | Admitting: Internal Medicine

## 2010-10-08 NOTE — Assessment & Plan Note (Signed)
OFFICE VISIT   [name], [name]  DOB:  10-22-1950                                       07/11/2008  [id number]   The patient returns having had an intervention by Dr. Trula Slade on January  26.  This included stenting of his right common iliac artery and his  right external iliac artery as well as dilating an in-stent stenosis in  a previous right common iliac artery stent.  He has had resolution of  his right hip and thigh claudication symptoms with ABIs improving from  0.8 to greater than 1 on the right and remaining at greater than 1 on  the left.  Has not been able to ambulate too much because of the  weather.  He is taking an 81 mg aspirin tablet per day.   PHYSICAL EXAMINATION:  Blood pressure 130/75, heart rate 69,  respirations 14.  He has excellent femoral and distal pulses  bilaterally.  Both feet are well-perfused.   He was reassured regarding these findings and will return to be followed  in the iliac stent protocol unless he develops any change in his  symptoms.   Nelda Severe Kellie Simmering, M.D.  Electronically Signed   JDL/MEDQ  D:  07/11/2008  T:  07/12/2008  Job:  [id number]

## 2010-10-08 NOTE — Assessment & Plan Note (Signed)
[facility]                            CARDIOLOGY OFFICE NOTE   ALEKSI, [name]                   MRN:          [id number]  DATE:04/14/2008                            DOB:          August 10, 1950    PRIMARY CARE PHYSICIAN:  Marijo Conception. Wall, MD, [facility]   HISTORY OF PRESENT ILLNESS:  [name] is a pleasant 60 year old  Caucasian male with a past medical history significant for coronary  artery disease status post prior CABG, hypertension, hyperlipidemia,  atrial flutter s/p ablation, degenerative disk disease, and chronic back  pain who recently underwent cervical disk surgery.  The patient is  followed in this office by Dr. Jenell Milliner who most recently saw the  patient on March 10, 2008.  The patient tells me that 3 days ago he  underwent cervical disk surgery.  He called into our office today and  stated that he felt an irregularity of his heart rhythm and he was  having some chest pain.  He was instructed to come into our office  today.  I was the doctor on-call in our office and scheduled him in my  clinic.  The patient also had a history of an atrial flutter ablation by  Dr. Cristopher Peru in the past.  The patient felt that he may have gone  back into atrial flutter.   When the patient arrived we obtained an EKG that showed normal sinus  rhythm with no evidence of atrial arrhythmias.  Upon further  questioning, the patient told me that for several days, he has been  having left-sided chest pressure that has been stabbing at times.  The  pain has been constant and has not escalated in intensity over the last  several days.  There is also associated shortness of breath.  The  patient tells me that this is similar pain that he felt prior to his  bypass surgery.   PAST MEDICAL HISTORY:  Unchanged and is described in detail above.   CURRENT MEDICATIONS:  1. OxyContin 40 mg 3 times daily.  2. Lovastatin 20 mg once daily.  3. Enteric-coated  aspirin 81 mg once daily.  4. Lopressor 12.5 mg once daily.  5. Xanax 0.5 mg 3 times daily.  6. Mobic 15 mg once daily.  7. Lyrica 100 mg 2 tablets at night.   REVIEW OF SYSTEMS:  As stated in history of present illness is otherwise  negative.   PHYSICAL EXAMINATION:  VITALS:  Blood pressure 136/82, pulse 63 and  regular, respirations 12 and nonlabored.  GENERAL:  This is a pleasant middle-aged Caucasian male in no acute  distress.  He is sitting on examination table with the cervical C-spine  collar in place.  He is alert and oriented x3.  SKIN:  Warm and dry.  PSYCHIATRIC:  Mood and affect are normal.  NECK:  No JVD.  No carotid bruits.  No thyromegaly.  No lymphadenopathy.  LUNGS:  Clear to auscultation bilaterally without wheezes, rhonchi, or  crackles noted.  CARDIOVASCULAR:  Regular rate and rhythm without murmurs, gallops, or  rubs noted.  ABDOMEN:  Soft, nontender.  Bowel sounds are present.  Chest wall, there  is mild chest tenderness with palpation over the left pectoral muscle.  EXTREMITIES:  No evidence of edema.  Pulses are 2+ in all extremities.   DIAGNOSTIC STUDIES:  1. A 12-lead EKG shows normal sinus rhythm with a ventricular rate of      63 beats per minute.  There is possible right atrial enlargement.      There are no other abnormalities noted.  2. CK level 74, CK-MB 2.7   ASSESSMENT AND PLAN:  This is a pleasant 60 year old Caucasian male who  recently underwent a cervical spine surgery and has a known history of  coronary disease, status post CABG in 2008 as well as hypertension,  hyperlipidemia, and continued tobacco abuse who presents today with  complaints of chest discomfort.  The patient relates that the chest pain  feels just as it did prior to his CABG procedure.  He had been having  the pain for the last 48 hours.  We went ahead and checked cardiac  enzymes today, which did not showed any evidence of ischemia.  His EKG  does not show any evidence  of ischemia.  His physical examination is not  indicative of any ongoing problem with his cardiopulmonary system.  I  have discussed this with the patient as well as his wife and his  daughter.  They are concerned about his pain.  I think it is appropriate  to set him up for an adenosine myocardial perfusion study next week.  The patient will have this test and will follow up with Dr. Jenell Milliner  in his clinic to review the results.  The patient is aware that he  should present to his nearest emergency department if he has any  worsening of his symptoms over the weekend.     Lauree Chandler, MD  Electronically Signed    CM/MedQ  DD: 04/14/2008  DT: 04/15/2008  Job #: [id number]
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
## 2010-10-08 NOTE — Op Note (Signed)
NAMEANDRIY, PURSER            ACCOUNT NO.:  [id number]   MEDICAL RECORD NO.:  [id number]          PATIENT TYPE:  AMB   LOCATION:  SDS                          FACILITY:  [facility]   PHYSICIAN:  Theotis Burrow IV, MDDATE OF BIRTH:  [birth year]   DATE OF PROCEDURE:  06/20/2008  DATE OF DISCHARGE:                               OPERATIVE REPORT   PREOPERATIVE DIAGNOSIS:  Right leg claudication.   POSTOPERATIVE DIAGNOSIS:  Right leg claudication.   PROCEDURE PERFORMED:  1. Ultrasound-guided access to the right common femoral artery.  2. Abdominal aortogram.  3. Bilateral lower extremity runoff.  4. Stent right common iliac artery.  5. Stent right external iliac artery.  6. Percutaneous transluminal angioplasty right common iliac artery (in-      stent stenosis).  7. Conscious sedation x30 minutes from 9:14 to 9:44.   INDICATIONS:  This is a 60 year old gentleman who had previously  undergone right common iliac stenting x2 for claudication.  He comes in  with worsening symptoms and had a decrease in his ankle-brachial  indices.   PROCEDURE:  The patient was identified in the holding area and taken to  the room where he was placed supine on table.  Bilateral groins were  prepped and draped in a standard sterile fashion. A time-out was called.  The right common femoral artery was evaluated with ultrasound and was  found to be widely patent.  This was accessed under ultrasound guidance  with an 18-gauge needle after the skin and subcutaneous tissue have been  anesthetized with 1% lidocaine.  An 0.35 wire was advanced in retrograde  fashion into the abdominal aorta under fluoroscopic visualization.  A 5-  French sheath was placed.  Omni flush catheter was placed at the level  of L1.  An abdominal aortogram was obtained.  Next, the catheter was  pulled out of the pelvis and a pelvic angiogram was obtained, followed  by bilateral lower extremity runoff.   FINDINGS:  Aortogram:   Visualized portions of the suprarenal abdominal  aorta showed minimal disease.  There is no evidence of renal artery  stenosis.  The infrarenal abdominal aorta is widely patent.   Pelvic Angiogram:  The left common external and internal iliac arteries  were all widely patent.  There is a stent within the right common iliac  artery with mild-to-moderate in-stent stenosis.  Just distal to the  stent at the origin of the internal iliac artery, there is an area of  high-grade stenosis.  There is also an area of luminal narrowing within  the right external iliac artery.   Left lower extremity:  The left common femoral artery is widely patent  with no critical disease.  The left profunda femoral artery is widely  patent.  The right superficial profunda femoral artery is widely patent.  There is three-vessel runoff.   Intervention:  At this point in time, decision was made to intervene on  the right iliac arterial system.  Over a Rosen wire, a 5-French sheath  was exchanged out for a 7-French bright tip sheath.  I elected to  primarily stent the iliac  lesion.  The patient was given 3000 units of  heparin.  In order to properly address the iliac stenosis, I elected to  cover the right hypogastric artery.  The 8 x 18 Genesis balloon  expandable stent was advanced across the lesion.  The stent originated  in the distal aspect of the previously deployed stent in the common  iliac artery extending across the hypogastric takeoff and into the right  external iliac artery.  It was taken to 10 atmospheres with a cephalad  balloon.  A retrograde femoral angiogram was then performed, which  revealed evidence of in-stent stenosis that was not resolved.  The 8-mm  balloon was then advanced into the previously deployed common iliac  stent and taken to 10 atmospheres.  Following this, a retrograde  angiogram was performed, the right in-stent stenosis was resolved as was  the stenosis within the external and  common iliac arteries.  A second  lesion in the external iliac artery, which previously looked  significant, no longer appeared so, it could have potentially been  dilated by advancing the sheath regardless I did not feel like it was  significant at this time.  At this point, decision was made to terminate  the procedure.  Catheters and wires were removed.  The patient will be  taken to the holding area for sheath pull.   IMPRESSION:  High-grade stenosis distal to the previously deployed stent  at the origin of the hypogastric artery.  A Genesis 8 x 18 balloon  expandable stent was deployed in the common and external iliac arteries  expanding the hypogastric artery.           ______________________________  V. Leia Alf, MD  Electronically Signed     VWB/MEDQ  D:  06/20/2008  T:  06/21/2008  Job:  [id number]

## 2010-10-08 NOTE — Assessment & Plan Note (Signed)
[facility] OFFICE NOTE   LEVELLE, BALSAMO                   MRN:          [id number]  DATE:05/03/2008                            DOB:          10-03-1950    Mr. Freni comes in today for followup of his chest pain.  Please see  Dr. Camillia Herter complete note on April 14, 2008.   He has had no further chest discomfort.  Dr. Angelena Form checked an EKG  which was stable and cardiac enzymes which were negative.   He performed a stress Myoview which shows normal left ventricular  function, EF of 57% with no scar or ischemia.   Remarkably, Mr. Holahan had cervical spine surgery several weeks ago.  His wife said he had a lot of burping and belching when he had this  chest discomfort.  It sounds gastroesophageal.  This now resolved.  He  is not on any acid inhibitor, but is taking some Mobic.   PHYSICAL EXAMINATION:  Today, he is in no acute distress.  His blood  pressure is 118/70.  His pulse is 72 and regular.  Weight is 161.  The  rest of his exam is unchanged.   I have talked to Mr. and Mrs. Reinwald about the results of his studies  and reassured them.  I think much of this was probably gastroesophageal  reflux with some spasm with the stress of surgery.  I have told him that  if this recurs, we can certainly treat this with medication.  Otherwise,  we will plan on seeing him back again in a year.   He was also talked to about not smoking.  He is still smoking about a  pack a day.     Thomas C. Verl Blalock, MD, [facility]  Electronically Signed    TCW/MedQ  DD: 05/03/2008  DT: 05/04/2008  Job #: [id number]

## 2010-10-08 NOTE — Assessment & Plan Note (Signed)
OFFICE VISIT   SAMMIE, AKKERMAN  DOB:  19-Dec-1950                                       05/30/2008  [id number]   HISTORY:  This is an office visit.  Mr. Spanbauer returns today for  continued followup regarding his lower extremity claudication symptoms  and his aortoiliac occlusive disease.  He has previously had stenting of  his right common iliac performed by me in 2005.  He had a second  procedure in 2007 when he had a recurrent stenosis both within the stent  and proximally.  He had a second stent placed.  He had a good result  from September 2007 until now when he has recurrent claudication  symptoms in both the left hip, the right hip and thigh.  The right leg  is worse than the left.  He has had no stents placed in the left side in  the past.  He is able to walk about one-half block before he has severe  symptoms in the right hip and thigh, and eventually develops left hip  symptoms as well.  He has had no rest pain or history of nonbleeding  ulcers, but has had some bluish discoloration of his toes.   PHYSICAL EXAMINATION:  VITAL SIGNS:  Blood pressure 130/76, heart rate  71, respirations 14.  ABDOMEN:  Soft, nontender with no masses.  EXTREMITIES:  He has 2+ right femoral pulse with a 2+ popliteal,  posterior tibial and dorsalis pedis pulse on the right.  Left leg has a  3+ femoral, popliteal, dorsalis pedis and posterior tibial pulse.  ABIs  on the left are greater than 1.0.  On the right had decreased from 0.96  to 0.80.  Carotid pulses are 3+ with no audible bruits.  NEUROLOGIC:  Normal.   I suspect he had developed another recurrent stenosis either proximal to  the stent or within the stent on the right and may well have iliac  disease on the left which is causing symptoms despite his normal ABI.  We will schedule him for angiogram to be performed by Dr. Trula Slade on  Tuesday, June 20, 2008 with further stenting of the iliac as  indicated at that time.   Nelda Severe Kellie Simmering, M.D.  Electronically Signed   JDL/MEDQ  D:  05/30/2008  T:  05/31/2008  Job:  [id number]

## 2010-10-08 NOTE — Op Note (Signed)
[name], [name]            ACCOUNT NO.:  [id number]   MEDICAL RECORD NO.:  [id number]          PATIENT TYPE:  OIB   LOCATION:  5019                         FACILITY:  [facility]   PHYSICIAN:  Jessy Oto, M.D.   DATE OF BIRTH:  01/16/1951   DATE OF PROCEDURE:  10/08/2007  DATE OF DISCHARGE:                               OPERATIVE REPORT   PREOPERATIVE DIAGNOSIS:  Herniated nucleus pulposus, left lateral C6-7  affecting primarily in the C7, C8 nerve roots.   POSTOPERATIVE DIAGNOSIS:  Herniated nucleus pulposus, left lateral C6-7  affecting primarily in the C7, C8 nerve roots.   PROCEDURE:  Left C6-7 [place] foraminotomy with excision of herniated  nucleus pulposus.  Microscope was used.   SURGEON:  Jessy Oto, M.D.   ASSISTANT:  Epimenio Foot, PA-C   ANESTHESIA:  General via oral tracheal intubation, Dr. Tobias Alexander,  supplemented with local infiltration with Marcaine 0.5% with 1:200,000  epinephrine.   FINDINGS:  Herniated nucleus pulposus, left C6-7 with 4 fragments of  disk material.   ESTIMATED BLOOD LOSS:  20 mL.   COMPLICATIONS:  None.   DRAINS:  None.   The patient returned to PICU in good condition.   HISTORY OF PRESENT ILLNESS:  The patient is a 60 year old male who has  been followed for the past 3 months for problems with neck pain with  radiation to the left upper extremity.  He has had pain, discomfort into  the ulnar digits of the dorsal aspect of the hand, findings consistent  with C7 radiculopathy.  Radiograms demonstrating a nonunion of C6-7  fusion in the past.  A severe facet arthrosis changes in the C4-5 on the  left.  He underwent facet injection with significant relief of pain.  It  persisted with a severe pain into his upper extremity though radiation  into his arm and hand and ulnar nerve distribution and into the top end.  MRI scan was done after a CT scan showed that the patient had nonunion  at C4-5 and some mild spondylosis changes.   MRI scan demonstrated a  large disk protrusion lateral of the left C6-7 all affecting the C7  nerve root and C8 nerve roots.  He was brought to the operating room to  undergo posterior cervical foraminotomy as he has had previous 3-level  anterior cervical diskectomy and fusion offers to preserve his motion  segments.   INTRAOPERATIVE FINDINGS:  As above.   DESCRIPTION OF PROCEDURE:  After adequate general anesthesia, the  patient was turned to a prone position, [place]field horseshoe was used to  support the head side flexioned.  Arms at the sides held in place with a  rolled sheet.  Chest rolls were used.  The legs well padded.  PAS hose  and a TED hose were used.  Standard prep with DuraPrep solution over the  posterior aspect of neck and upper dorsal spine.  Draped in the usual  manner.  Iodine Vi-Drape was used.  Palpation of the spinous process  indicating likely C6-C7 level as above this area of spine dropped off  rapidly.  Area infiltrated  then over the expected C6-C7 level using  Marcaine 0.5% with 1:200,000 epinephrine 5 mL.  Incision was made with a  #10 blade scalpel down to the ligamentum nuchae along the spinous  processes here.  The incision made down to the ligamentum nuchae and  spinous process is expected at C6-C7.  The incision about an inch to 1-  1/2 inch opened in length through the skin and subcutaneous layers was  noted.  And then electrocautery was used to incise the ligamentum  nuchae, the ligaments along the left side of the C6-C7 spinous  processes.  These areas were further infiltrated with Marcaine 0.5% with  1:200,00 epinephrine and the spinous process on the left, the C6 level  was then marked with a clamp.  A towel clip was used.  Intraoperative  lateral radiograph demonstrated clamp on the spinous process of C6 and  this was marked for further identification with a single suture of 0-  Vicryl left in place. The paracervical muscles then carefully removed   off the left side and the posterior aspect of lamina and spinous process  of C6, down to the posterior aspect of lamina of C6, and care was taken  to carefully expose the intralaminar region over the lateral side of C6-  7 then over the posterior aspect of the lamina of C7.  This was  continued outwards to about 50% of the frame of the facet on the left  side.   Bleeding was controlled using bipolar and monopolar electrocautery.   A Boss McCullough retractor was then placed, the blade beneath  paracervical muscles and the post medial in the inner spinous process  ligament region obtaining good visualization at this level.  Using loop  modification and head lamp, high-speed bur was then used to remove the  small portion of the anterior aspect of lamina of C6 on the left side  laterally and a small portion of the medial aspects of the anterior  articular process at C6, approximately a 10-15%.  Stenting was performed  in the superior aspect of lamina C7 as well.  Ligamentum nuchae and  ligamentum flavum left intact.  Next, a 2-mm Kerrison was able to be  introduced, and a thin shallow bone representing the residual of the  anterior articular process of C6 and the inferior aspect of the lateral  portion of the lamina was then resected using a 2-mm Kerrison.  Operating room microscope draped and brought into the field.  Bone wax  applied to bleeding cancellous bone surfaces.  Ligamentum flavum was  then carefully excised off the superior aspect of the lamina of C7 over  its lateral aspect and then the medial portion of the superior articular  process at C7 was then carefully thinned and removed over about 10-15%  medially opening the neural foramen and releasing the ligamentum flavum  attachment off the medial aspect of the facet here.  Epidural veins were  carefully identified.  These were elevated, cauterized overlying the  axillary area of the C7 nerve root and lateral aspect of the thecal  sac.  Here they were elevated using a micro Titanium nerve hook.  The vascular  leash overlying the C7 nerve root and its exit from the neural foramen  was carefully lifted and then cauterized the body using a 15 blade  scalpel.  The C7 nerve root was easily identified.  A large disk  herniation extending over the lateral aspect of the thecal sac within  the axillary area  of the C7 nerve root was present.  Vascular leash that  had been carefully released, nicely retracted identifying the disk  herniation area.  Carefully, this area was probed then using a micro  titanium nerve hook identifying the disk and separating it from the  overlying thecal sac.  A 11-blade scalpel then used to incise the disk  herniation material which was subligamentous and immediately the disk  herniation material  extruded.  Titanium nerve hook was then used to  free the disk material. Micropituitary used to excise disk material  using operating room microscope that had been draped and brought in the  field sterilely.  This continued to be examined and continued to be  explored for further herniated disk material.  At least 4 or 5 fragments  were removed from the subligamentous portion of the posterior  longitudinal ligament on the left side within the axillary area of the  C7 nerve root, fully decompressing the spinal canal and the C7 nerve  root.  A micro titanium nerve root was able to be placed within the disk  space through the rent that was  present there.  No further disk  material was then removed at this point.  The spinal canal was felt to  be fully decompressed, as well as, the neural foramen in the C7 nerve  root exiting out to the C7 neural foramen.  Very low bleeding was  evident.  Thrombin-soaked Gelfoam was placed for a moment or two and  then quickly removed.  No Gelfoam was left remaining within the spinal  canal.  Traction was released, there was no active bleeding present.  Small bleeders  over the paracervical muscles and their previous  attachments with the spine were carefully cauterized using bipolar  electrocautery.  These were well controlled and there was no active  bleeding present.  Ligamentum nuchae was reapproximate with interrupted  0-Vicryl sutures using UR6 needle.  Deep subcuticular was approximated  with interrupted 0-Vicryl sutures and then 2-0 Vicryl sutures and the  skin closed with interrupted 4-0 Vicryl sutures.  Dermabond used to  close the skin edges.  A 4 x 4 fixed the skin with Hypafix tape.  The  patient was then reactivated, extubated, and returned to recovery room  in satisfactory condition.  All instruments and sponge counts were  correct.      Jessy Oto, M.D.  Electronically Signed     JEN/MEDQ  D:  10/08/2007  T:  10/09/2007  Job:  [id number]
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
## 2010-10-08 NOTE — Assessment & Plan Note (Signed)
[facility] OFFICE NOTE   Agustine, Gortney NASHAWN DEALMEIDA                   MRN:          [id number]  DATE:03/10/2008                            DOB:          February 13, 1951    Mr. Nano comes in today for preop clearance for C7 disk surgery.  He  has had this operated on in the past by Dr. Louanne Skye.   Unfortunately, he went for surgery this past week and it was postponed  because of bronchitis.  He had a chest x-ray, which apparently showed no  pneumonia.  He was told to quit smoking.  He would like some Chantix.   I do have his chest x-ray from March 08, 2008, which showed no acute  cardiopulmonary abnormalities.  He has got COPD and left upper lobe  scarring.   He rarely admits he needs some help stopping smoking.  He is coughing  constantly, bring up a lot of secretions.  He cleared without trouble  with general anesthesia.  He is having no angina.  He says he does have  some claudication when walks.  He has had previous peripheral vascular  disease and has stents.   MEDICATIONS:  1. OxyContin 40 mg t.i.d.  2. Lovastatin 20 mg q.h.s.  3. Enteric-coated aspirin 80 mg.  4. Lopressor 12.5 mg per day.  5. Xanax 0.5 mg t.i.d.  6. Mobic 15 mg a day.  7. Lyrica 1000 mg q.h.s.   PHYSICAL EXAMINATION:  VITAL SIGNS:  His blood pressure today is 122/70,  his pulse is 80 and regular, his weight is 164 down 5.  HEENT:  Normocephalic, atraumatic.  PERRLA.  Extraocular movements  intact.  Sclerae are injected.  He has a very ruddy complexion.  He has  dry raspy cough and raspy voice.  NECK:  Supple.  Carotids are equal bilaterally bruits.  No JVD.  LUNGS:  Marked inspiratory and expiratory rhonchi.  HEART:  Regular rate and rhythm.  No gallop.  ABDOMEN:  Soft.  EXTREMITIES:  Pulses to be 2+/4+ in the left lower extremity, 1+/4+ in  the right lower extremity.  He has dependent rubor.  He has late  capillary reflex.  Has no  edema.  No sign of DVT.  NEURO:  Intact.   ASSESSMENT AND PLAN:  I had a long talk with Mr. Hoe and his wife  today.  He clearly has to quit smoking not just the surgery, but for a  long term.  He is agreed to take Chantix,  which I have written for today.  I have cleared him for surgery from a  cardiac standpoint once his lungs are  clear.     Thomas C. Verl Blalock, MD, [facility]  Electronically Signed    TCW/MedQ  DD: 03/10/2008  DT: 03/11/2008  Job #: [id number]   cc:   Jessy Oto, M.D.

## 2010-10-08 NOTE — Assessment & Plan Note (Signed)
[place]                         ELECTROPHYSIOLOGY OFFICE NOTE   MARRELL, TRUCKS                   MRN:          [id number]  DATE:10/06/2006                            DOB:          1950/06/16    Mr. Riegsecker returns today for followup. He is a very pleasant, middle-  age man with ischemic heart disease and a history of atrial flutter who  is status post bypass surgery and is status post catheter ablation of  his atrial flutter. His ablation was done approximately 1 month ago. He  has had no recurrent palpitations and overall feels well. He has been  maintained on Coumadin and is here today for additional followup. He  denies chest pain or shortness of breath.   PHYSICAL EXAMINATION:  GENERAL:  He is a pleasant, well-appearing,  middle-age man in no distress.  VITAL SIGNS:  The blood pressure was 130/80, the pulse was 80 and  regular, the respirations were 18, weight was 164 pounds.  NECK:  Revealed no jugular venous distention.  LUNGS:  Clear bilaterally to auscultation. There were no wheezes, rales  or rhonchi present.  CARDIOVASCULAR:  Regular rate and rhythm with normal S1 and S2.  EXTREMITIES:  Demonstrated no edema.   The EKG demonstrates sinus rhythm with normal axis and intervals.   MEDICATIONS:  OxyContin, Xanax, Mobic, lovastatin, aspirin 81 a day,  Coumadin daily.   IMPRESSION:  1. Coronary artery disease status post bypass surgery.  2. Atrial flutter status post catheter ablation.  3. Coumadin therapy.  4. Hypertension.   DISCUSSION:  Overall Mr. Paccione is stable and he has maintained sinus  rhythm since his ablation. I have recommended that he discontinue his  Coumadin at this point as he is not status post ablation and his risk  for developing additional atrial rhythms is quite low. I will plan to  see him back in the office on a p.r.n. basis. He will followup with Dr.  Mar Daring for his other cardiac issues and Dr.  Osvaldo Human for his  primary care.     Champ Mungo. Lovena Le, MD  Electronically Signed    GWT/MedQ  DD: 10/06/2006  DT: 10/06/2006  Job #: [id number]   cc:   Osvaldo Human, M.D.

## 2010-10-08 NOTE — Op Note (Signed)
[name], [name]            ACCOUNT NO.:  [id number]   MEDICAL RECORD NO.:  [id number]          PATIENT TYPE:  OIB   LOCATION:  5037                         FACILITY:  [facility]   PHYSICIAN:  Jessy Oto, M.D.   DATE OF BIRTH:  1951-04-11   DATE OF PROCEDURE:  DATE OF DISCHARGE:  04/12/2008                               OPERATIVE REPORT   PREOPERATIVE DIAGNOSES:  Recurrent herniated nucleus pulposus left C6-C7  with extruded fragment left C7-C8 nerve root compression.   POSTOPERATIVE DIAGNOSES:  Recurrent herniated nucleus pulposus left C6-  C7 large extruded fragment extending over the posterior inferior aspect  of the C7 vertebral body.   PROCEDURE:  Anterior cervical diskectomy with fusion with zero-P  implant, 10-mm height lordotic cage and local bone graft.  16 mm screws.   SURGEON:  Jessy Oto, MD   ASSISTANT:  Epimenio Foot, PA-C.   ANESTHESIA:  General via orotracheal intubation, Dr. Tobias Alexander.   ESTIMATED BLOOD LOSS:  40 mL.   DRAINS:  TLS drain, 7-French x1.   BRIEF CLINICAL HISTORY:  This patient is 60 year old male who has  undergone previous left sided posterior cervical foraminotomy in May  2009.  The foraminotomy was performed in C6-C7 level for large disk  herniation and a large amount of disk material was removed.  The patient  did well for long period of time then felt a pop in his neck, sudden  worsening discomfort and pain radiating into the left shoulder.  He is  treated conservatively and managed in pain management.  However he has  had some persistence of weakness in the left triceps function although  finger extension strength has returned.  After a period of conservative  management without significant relief.  Followup MRI scan was obtained  which demonstrated a recurrent disk herniation versus residual disk  herniation at left C6-7.  It is felt that this represented a very large  recurrent disk herniation as there was no sign of  significant herniation  at the end of his previous case.  He has undergone a standard  preoperative evaluation in the hospital, discussion regarding the risks  of surgery was involved including infection, bleeding, neurologic  compromise.  He has had a history of previous anterior diskectomy and  fusion surgery at C4-C5 and C5-C6 so that this represents the third  segment fusion for him.  He has previous plates and screws at the C5-C6  level.  He has demonstrated nonunion at the C4/C5 level.  The patient  was brought to the operating room to undergo anterior diskectomy and  fusion at C6-C7 for recurrent disk herniation with large free fragment  extending over the posterior aspect of the vertebral body of C7 at the  left side.  Intraoperative findings as above.   DESCRIPTION OF PROCEDURE:  This patient had preoperative marking on the  left side of his neck.  The Incision to be made transverse on the left  side at the C6-C7 level.  This area was marked preoperatively.  He  received preoperative antibiotics of Ancef and sterile prep with  DuraPrep solution over the  anterior neck in 5 pounds Cervical Halter  Traction, slight extension using the Skytron bed in a typical beach-  chair position.  No Foley catheter was placed.  All pressure points were  well-padded.  He had PAS stockings to prevent DVTs.  Following prep with  DuraPrep solution, the patient was draped in the usual manner, iodine Vi-  drape was placed.  Incision over the left neck approximately 3 to 3-1/2  inches in length through the skin and subcutaneous layers about a  fingerbreadth above the medial clavicle.  Incision was made in the lines  of Langer or within the lines of the skin creases present.  Through the  skin and subcutaneous layers, the platysma layer was then incised in  line with skin incision and spread.  Bleeders were then ligated using 2-  0 Vicryl sutures.  In the interval between the trachea and esophagus   medially and the carotid sheath laterally was then bluntly dissected  using finger dissection to the anterior aspect of the cervical spine.  Kittner dissector used to suck prevertebral fascial layers anteriorly  and the prevertebral fascia then incised over the medial border of the  longus colli muscle to the left side at the expected C6-C7 level and  teased across the midline.  A intraoperative radiograph was obtained  with a spinal needle at the expected C6-C7 level demonstrating the  needle at this segment.  Using handheld Cloward then the needle was  carefully removed under direct observation and a small portion of the  disk excised using 15-blade scalpel.  Electrocautery used to carefully  free up and expose the anterior aspect of the vertebral bodies at the  sides of the disk space superior to C6-C7 inferior and C6-C7.  A 3 mm  Kerrison used to remove bone from the anterior lip, osteophytes present.  This was saved for later bone grafting purposes.  Residual disk material  was then resected from the disk space using 15-blade scalpel to incise  the annulus with pituitary rongeurs and then curettage using micro  curettes down to endplate bone surfaces.  This was then taken  posteriorly and osteophytes were then burred and drilled using a high-  speed bur at its end.  A 1 mm Kerrison was then used to resect the  posterior lip osteophytes as well as 2 mm Kerrison used.  These were  also kept for bone grafting purposes at the end of the case.  Large  amount of disk material was found to be present on the left posterior  aspect of the disk space and this was found be within the canal causing  compression on the patient's cord on the left side C7 nerve root.  This  was excised using micro pituitaries as well as titanium nerve hooks and  carefully the spinal canal was explored.  Then there was no further disk  material present in the cord and the C7 nerve root appeared to be  returned to  their normal position alignment against the posterior aspect  of the disk space and within the foramen at the C6-C7 level.  This tend  to support, decompression was complete.  Irrigation was carried out.  The endplates were then made parallel using a high-speed bur carefully  burring these areas.  The height of the intervertebral disk space was  then measured using sounder so that a 10 mm sounder provided the best  implant fit.  The zero-P implant was then packed with bone graft  material  lordotic type cage 10 mm height packed with local bone graft  and plenty of graft was found to be present.  The inserting device was  then placed over the intervertebral disk space carefully impacted into  place.  Cage was then carefully inserted by twisting the insertion  device carefully lifting the end plate and inserting the device at the  same time.  Care was taken to ensure that the implant was centralized on  a view from above the patient's head as well as inferiorly.  C-arm  fluoro was used to ascertain position alignment of the zero-P implant  prior to placement of screws.  The implant observed both lateral and AP  to be in excellent position alignment so that 16 mm screws were then  placed using the awl to make an entry point for the screws and then  using the screw guide in order to place each of the 16 mm screws in the  correct transition or correct position and generally these were done at  diagonals using the proper screw jig.  Once the screws were placed and  intraoperative lateral and AP radiographs were again obtained  demonstrating excellent position alignment of the implant.  Irrigation  was carried out.  Bleeders controlled using bipolar electrocautery.  When there is no bleeding present and a 7 mm talus drain placed in the  depths of the incision exiting anteriorly and sewn in place.  A 4-0  nylon stitch was used to hold this in place.  Note that the operating  room microscope was  used during the procedure in order to excise the  herniated disk for the posterior aspect of the disk space on the left  side.  Microscope was draped sterilely and brought into the field and  under the microscope then posterior longitudinal ligament was taken down  on the left side and disk excised.  Intraoperative time-out was carried  out with this patient as well in standard fashion identifying the  patient, procedure being performed, participants and any concerns.  Closure was then carried out reapproximating the platysma layer with  interrupted 3-0 Vicryl sutures, deep subcu layers with interrupted 3-0  Vicryl sutures, skin with the subcu stitch of 4-0 Vicryl and then the  patient had Dermabond applied.  The patient then had a [place]  collar applied.  He was reactivated, extubated, and returned to recovery  room in satisfactory condition.  All instrument and sponge counts were  correct.   Note that the patient's anesthesiologist intraoperatively was Dr.  Tobias Alexander.      Jessy Oto, M.D.  Electronically Signed     JEN/MEDQ  D:  06/30/2008  T:  07/01/2008  Job:  [id number]
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
## 2010-10-08 NOTE — Assessment & Plan Note (Signed)
[name]                            CARDIOLOGY OFFICE NOTE   NAME:[name], [name]                   MRN:          [id number]  DATE:11/17/2006                            DOB:          Feb 07, 1951    OBJECTIVE:  [name] returns today for further management of his  coronary artery disease, hyperlipidemia and tobacco use.  He has also  had atrial flutter and is status post catheter ablation and followup  with [name]. [name] on Oct 06, 2006.  Dr. Lovena [name] released him and  will see him back on a p.r.n. basis.   He unfortunately started smoking after 31 days.  He said he was about to  climb the walls and go crazy.  He is smoking one pack a day, versus two  packs a day prior to his bypass.  He is now three months out from his  bypass surgery.   CURRENT MEDICATIONS:  1. Aspirin 81 mg q.d.  2. Lovastatin 20 mg q.h.s.   His blood work is being followed by Dr. Osvaldo [name].  We stopped his  metoprolol because it was bottoming out his pressure.   PHYSICAL EXAMINATION:  VITAL SIGNS:  Blood pressure today 128/84, pulse  80 and regular, weight 162 pounds.  HEENT:  Unchanged.  NECK:  Carotid upstrokes equal bilaterally without bruits.  No jugular  venous distention.  Thyroid not enlarged.  Trachea midline.  LUNGS:  Clear except for some inspiratory and expiratory rhonchi.  HEART:  Reveals a soft S1 and S2 without gallop.  The sternotomy site is  healed.  ABDOMEN:  Soft.  EXTREMITIES:  No edema.  Pulses intact.  NEUROLOGIC:  Intact.   [name] is doing well from a cardiovascular standpoint.  We talked  again about his chronic lung disease, not to mention his risk of  cardiovascular events in the future, if he continues to smoke.  After  some discussion, we prescribed Chantix starter pack with several months  of refills.   From a cardiovascular standpoint, I will see him back in one year.  I  advised him to see Dr. Roderick [name] for blood  work followup.  He also has an  appointment with Dr. Valentina Gu. Roxy [name] for a chest CT, to follow up for  what I assume is an abnormal chest x-ray.     Thomas C. Verl Blalock, MD, [facility]  Electronically Signed    TCW/MedQ  DD: 11/17/2006  DT: 11/17/2006  Job #: [id number]   cc:   [name], M.D.  [name], M.D.

## 2010-10-10 ENCOUNTER — Encounter: Payer: Self-pay | Admitting: Internal Medicine

## 2010-10-10 ENCOUNTER — Ambulatory Visit (INDEPENDENT_AMBULATORY_CARE_PROVIDER_SITE_OTHER): Payer: 59 | Admitting: Internal Medicine

## 2010-10-10 VITALS — BP 120/68 | HR 86 | Ht 69.0 in | Wt 154.4 lb

## 2010-10-10 DIAGNOSIS — J449 Chronic obstructive pulmonary disease, unspecified: Secondary | ICD-10-CM

## 2010-10-10 DIAGNOSIS — J439 Emphysema, unspecified: Secondary | ICD-10-CM | POA: Insufficient documentation

## 2010-10-10 DIAGNOSIS — J90 Pleural effusion, not elsewhere classified: Secondary | ICD-10-CM | POA: Insufficient documentation

## 2010-10-10 DIAGNOSIS — C3490 Malignant neoplasm of unspecified part of unspecified bronchus or lung: Secondary | ICD-10-CM

## 2010-10-10 NOTE — Progress Notes (Signed)
  Subjective:    Patient ID: [name], male    DOB: Oct 10, 1950, 60 y.o.   MRN: [id number]  HPI 18 yoM former heavy smoker. Post hospital consult. S/P RML and RLL lobectomy Feb, 2012 at [facility]. He had one round of chemotherapy in early April, 2012, then admitted [facility] with chills, fever, productive cough, dyspnea and leukocytosis w/ pleural effusion. Thoracentesis and all cultures no growth. Treated empirically with broad antibiotics-Primaxin/Vanc. Gradually improved, but with persistent leukocytosis blamed on hx of splenectomy, Neupogen. Bronchoscopy 09/10/10- nonspecific inflammation. Recent f/u WBC coming down. Patient refuses to consider further chemotherapy.  Now little cough, white phlegm, no fever, no blood, some night sweats.  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  CXR 09/19/10 clearing Right pneumonia; stable nodule c/w scar, right effusion vs post op pleural thickening. CXR 2 weeks ago at [facility].    Review of Systems Constitutional:   No weight loss. Some night sweats,  No Fevers, chills, fatigue, lassitude. HEENT:   No headaches,  Difficulty swallowing,  Tooth/dental problems,  Sore throat,                No sneezing, itching, ear ache, nasal congestion, post nasal drip,   CV:  No chest pain, orthopnea, PND, swelling in lower extremities, anasarca, dizziness, palpitations  GI  No heartburn, indigestion, abdominal pain, nausea, vomiting, diarrhea, change in bowel habits, loss of appetite  Resp: No acute worsening of shortness of breath with exertion or at rest.  No excess mucus, no productive cough,  No non-productive cough,  No coughing up of blood.  No change in color of mucus.  No wheezing.  Skin: no rash or lesions.  GU: no dysuria, change in color of urine, no urgency or frequency.  No flank pain.  MS:  No joint pain or swelling.  No decreased range of motion.  No back pain.  Psych:  No change in mood or affect. No depression or anxiety.  No memory loss.       Objective:   Physical Exam General- Alert, Oriented, Affect-appropriate, Distress- none acute  Skin- rash-none, lesions- none, excoriation- none   tatoos and scars  Lymphadenopathy- none  Head- atraumatic  Eyes- Gross vision intact, PERRLA, conjunctivae clear secretions  Ears- Hearing, canals, Tm- normal  Nose- Clear with external deviation, Septal dev, No- mucus, polyps, erosion, perforation   Throat- Mallampati II , mucosa clear , drainage- none, tonsils- atrophic       dentures  Neck- flexible , trachea midline, no stridor , thyroid nl, carotid no bruit  Chest - symmetrical excursion , unlabored     Heart/CV- RRR , no murmur , no gallop  , no rub, nl s1 s2                     - JVD- none , edema- none, stasis changes- none, varices- none     Lung- clear to P&A, wheeze- none, cough- slight dry;     Dull to percussion 2/3 right, no rub     Chest wall-   Abd- tender-no, distended-no, bowel sounds-present, HSM- no,    Midline scar  Br/ Gen/ Rectal- Not done, not indicated  Extrem- cyanosis- none, clubbing, none, atrophy- none, strength- nl  Neuro- grossly intact to observation         Assessment & Plan:

## 2010-10-10 NOTE — Assessment & Plan Note (Addendum)
He is going untreated at present.  Hospital dc summary refers to small cell, but they are sure he has squamous cell ca

## 2010-10-10 NOTE — Assessment & Plan Note (Signed)
He was advised by his cancer doctors at [facility] to start pulmonary rehab again. He was previously enrolled at [facility].

## 2010-10-10 NOTE — Patient Instructions (Signed)
Order- refer to Interventional Radiology for Right thoracentesis, ultrasound guided,         Labs for cell count and diff, LDH, Total protein, cytology and cell block, routine/ fungal/ AFB culture and sensitivity.

## 2010-10-11 NOTE — Op Note (Signed)
NAME:  [name], [name]                        ACCOUNT NO.:  [id number]   MEDICAL RECORD NO.:  [id number]                   PATIENT TYPE:  OIB   LOCATION:  5011                                 FACILITY:  [facility]   PHYSICIAN:  Jessy Oto, M.D.                DATE OF BIRTH:  1950/06/03   DATE OF PROCEDURE:  12/14/2003  DATE OF DISCHARGE:                                 OPERATIVE REPORT   PREOPERATIVE DIAGNOSIS:  Chronic herniated nucleus pulposus right L5-S1 and  central with right-sided lateral recess stenosis and neurogenic claudication  in an S1 distribution.   POSTOPERATIVE DIAGNOSIS:  Chronic herniated nucleus pulposus right L5-S1 and  central with right-sided lateral recess stenosis and neurogenic claudication  in an S1 distribution.   PROCEDURE:  Microdiscectomy right L5-S1 with lateral recess decompression,  decompression of both L5 and S1 nerve root.   SURGEON:  Jessy Oto, M.D.   ASSISTANT:  Epimenio Foot, P.A.C.   ANESTHESIA:  GOT, Dr. Sela Hilding.   ESTIMATED BLOOD LOSS:  10 mL.   DRAINS:  None.   BRIEF CLINICAL HISTORY:  The patient is a 60 year old male who has had a  history of chronic disc protrusion at the L5-S1 level.  He has been treated  rather conservatively.  He does have a past history of smoking and has had  problems with neurogenic and arterial claudication of the right lower  extremity.  He underwent a stent placement in the right lower extremity by  Dr. Nelda Severe. Kellie Simmering several months ago but has persisted with discomfort  into his right lower extremity with standing and ambulation, in an S1  distribution.  MRI studies demonstrated chronic disc herniation central and  right side with lateral recess stenosis effecting the right S1 nerve root.  After attempts at conservative management including selective nerve root  block which relieved his pain temporarily.  He is brought to the operating  room to undergo a right-sided microdiscectomy L5-S1  with right lateral  recess decompression L5-S1 for findings of chronic disc protrusion and  lateral recess stenosis associated with this.   FINDINGS:  Hypertrophic changes involving the right L5-S1 facet.  Disc  protrusions central and right were causing compression primarily of the  right S1 nerve root.   DESCRIPTION OF PROCEDURE:  After adequate general anesthesia, the patient in  the knee-chest position, standard preoperative antibiotics.  All pressure  points well padded, standard prep with DuraPrep solution, draped in the  usual manner.  Initial placement of a spinal needle at the expected L5-S1  level, intraoperative lateral radiograph demonstrated the needle at the  posterior portion of the spinous process of L5 just opposite of the disc  space at L5-S1.  The needle removed an approximately 2.5 to 3 cm incision  made in the midline, centered at the needle through the skin and  subcutaneous layers down to the lumbodorsal fascia.  Bleeders controlled  using electrocautery.  Incision made on the right side of the lumbodorsal  fascia using Loop magnification, then the paralumbar muscles on the right  side were then elevated off of the lamina of the L5 and S1 and off of the  posterior aspect of interlaminar region.  Clamp placed on the spinous  process of L5 and intraoperative radiograph again taken demonstrating this  to be the L5 spinous process.  This was easily identified throughout the  case.  The McCullough retractor inserted on the right side of the spinous  process is retracting the paralumbar muscles on the right side.  Leksell  rongeur used to remove the small portion of the inferior aspect of lamina on  the right side at L5, freeing up the insertion of the ligamentum flavum off  the inferior anterior aspect of the lamina of L5.  The ligamentum flavum  carefully resected then off of the thecal sac posteriorly down to the level  of the S1 lamina.  On the right side.   Foraminotomy performed over the right  S1 nerve root, then the ligamentum flavum carefully trimmed off of the  medial aspect of the facet of L5-S1 level.  It was quite obvious the facet  was hypertrophic and causing impression on the right lateral aspect of the  thecal sac at L5 and S1 so that partial facetectomy was performed at about  15% of the joint, using a high speed bur to carefully trim the medial aspect  of the inferior articular process of L5.  Then __________ to resect the  medial portion of the supra-articular process of S1 excising the ligamentum  flavum along its insertion along this medial border, debriding the lateral  recess and decompressing the lateral recess nicely.  The L5 nerve root  carefully identified and retracted medially and the periepidural fat  carefully removed.  The disc noted to be protruded centrally and toward the  right side.  A single incision made with a 15 blade scalpel longitudinally.  This was dilated using a Penfield 4 and then the disc protrusion carefully  circumferentially freed up with Penfield 4 and resected using pituitary  rongeurs.  An Epstein curette used to further debride the ruptured disc  attachments to the end plates over the posterior aspect of the disc space.  Pituitary used to further debride disc material until hockey stick probe  could be passed over the posterior aspect of the disc space demonstrating  complete decompression of the disc with no further posterior protrusion  centrally or toward the right side at L5-S1.  The hockey stick neural probe  additionally could be passed out of the S1 nerve root foramen without  difficulty and out the L5 neural foramen laterally without signs of  compression.  Irrigation was performed.  Small bleeders controlled using  bipolar electrocautery.  _____________ used to obtain further hemostasis.  Bone wax applied to the bleeding cancellous bone surfaces along the medial aspect of the facet.   Excess bone wax removed.  Irrigation again performed.  Gelfoam then removed.  No active bleeding was felt to be evident at this  point.  Soft tissue allowed to fall back into place.   The lumbodorsal fascia then reapproximated in the midline with interrupted 0  Vicryl sutures using UR needle and a single #1 Vicryl suture.  Subcutaneous  layers approximated with interrupted 0 Vicryl suture and 2-0 Vicryl suture.  The skin closed with running subcutaneous stitch.  Benzoin and Steri-Strips  applied.  Coverlet  dressing.  The patient then returned to supine position  reactive and extubated and returned to the recovery room in satisfactory  condition.                                               Jessy Oto, M.D.    Liz Malady  D:  12/14/2003  T:  12/14/2003  Job:  [id number]

## 2010-10-11 NOTE — Assessment & Plan Note (Signed)
[facility]                                 ON-CALL NOTE   NICKSON, DIMARE                     MRN:          [id number]  DATE:09/06/2006                            DOB:          [birth year]    PRIMARY CARDIOLOGIST:  Dr. Verl Blalock.   EP CARDIOLOGIST:  Dr. Lovena Le.   PRIMARY CARE PHYSICIAN:  Dr. Roderick Pee.   SUMMARY OF HISTORY:  [name] is a 60 year old male who was recently  discharged on September 05, 2006, after atrial flutter ablation.  He calls  today concerned that he is short of breath.  He describes a long history  of shortness of breath but within the last 24 hours he thinks his  breathing has become more short.  He states that it does not necessarily  change with exertion and he notices it more at rest.  He is not  orthopneic and has not complained of PND.  He denies any cough or  wheezing or temperature.  He is no longer smoking.  He denies any edema,  weight gain or similar occurrences.  His heart rates have been in the  upper 50's and his blood pressures in the [id number] systolically.  He has  not had any problems with palpitations since discharge or dizziness.   After our discussion I explained to [name] that I could not  explain why he was short of breath as that he had no signs of heart  rhythm abnormalities given his controlled rate and stable blood  pressure.  He did not sound like he had any problems with heart failure.  I suggested to him that he could come to the emergency room and we would  be happy to begin evaluation which would include a chest x-ray, an EKG  and some blood work.  He was not very enthusiastic in doing this.  I  also stated that another option would be possibly to see if he could be  seen in the office tomorrow, for further evaluation of his shortness of  breath, either by his cardiologist or his primary care physician.  He  was more agreeable with the latter plan.  I explained to him that would  be fine,  that I would leave a message at the office in regards to his  concerns; however, if his breathing should become worse or he developed  any problems he should proceed to [facility] for further  evaluation.  I also asked him to please call us if he did need to come  to the emergency room so that we may be expecting him.     Sharyl Nimrod, PA-C  Electronically Signed    EW/MedQ  DD: 09/06/2006  DT: 09/06/2006  Job #: [id number]

## 2010-10-11 NOTE — Op Note (Signed)
NAME:  [name], [name]                      ACCOUNT NO.:  [id number]   MEDICAL RECORD NO.:  [id number]                   PATIENT TYPE:  AMB   LOCATION:  [place]                                  FACILITY:  [facility]   PHYSICIAN:  Yvette Rack., M.D.             DATE OF BIRTH:  Nov 30, 1950   DATE OF PROCEDURE:  11/09/2002  DATE OF DISCHARGE:                                 OPERATIVE REPORT   PREOPERATIVE DIAGNOSES:  1. Partial rotator cuff tear.  2. Partial biceps tendon tear.  3. Degenerative tearing, anterior superior labrum.   POSTOPERATIVE DIAGNOSES:  1. Partial rotator cuff tear.  2. Partial biceps tendon tear.  3. Degenerative tearing, anterior superior labrum.   PROCEDURES:  1. Arthroscopic acromioplasty.  2. Arthroscopic debridement of torn labrum.  3. Arthroscopic debridement of distal clavicle.  4. Arthroscopic debridement of rotator cuff.   SURGEON:  Lockie Pares, M.D.   ANESTHESIA:  General.   INDICATIONS:  A 60 year old with MRI-proven impingement, not responding to  conservative treatment over one to two years, thought to be amenable to  outpatient arthroscopy.   DESCRIPTION OF PROCEDURE:  Arthroscope after check found that range of  motion was normal with no instability.  Posterior, lateral, and anterior  portal created.  Systematic inspection of the glenohumeral joint showed  fraying of the biceps near its attachment into the labrum with some frank  fibrillation and small fronds of labrum consistent with a degenerative tear,  which was aggressively debrided separate from the remainder of the  procedure.  The glenohumeral joint showed no degenerative change.  The  biceps tendon was probably 15-20% torn near its insertion.  It was debrided.  Undersurface of the cuff, despite looking normal on MRI, showed a partial  tear of about 15-20% of the thickness of the rotator cuff.  It was debrided.  The bursal surface showed irregularity.  There was evidence of  clinical  impingement with hypertrophy of the bursa, followed up to the CA ligament.  The CA ligament was released, followed by acromioplasty.  We resected about  6-7 mm of bone on the anterior and leading edge of the acromion.  [facility]  joint was not clinically involved in the process.  Electrocautery as well as  a high-speed bur was used for the acromioplasty and debridement.  No  residual impingement was noted.  The resection level was checked both from  the lateral and posterior aspect of the shoulder.  The shoulder was drained  free of fluid.  The portals were  infiltrated with Marcaine and morphine as well as into the joint, a total of  20 mL 0.5% with epinephrine.  The portals were closed with nylon.  A lightly  compressive dressing and sling applied.  Taken to recovery in stable  condition.  Yvette Rack., M.D.    WDC/MEDQ  D:  11/09/2002  T:  11/10/2002  Job:  [id number]

## 2010-10-11 NOTE — Op Note (Signed)
NAMEWWLLIAM, ABUKAR            ACCOUNT NO.:  [id number]   MEDICAL RECORD NO.:  [id number]          PATIENT TYPE:  AMB   LOCATION:  SDS                          FACILITY:  [facility]   PHYSICIAN:  Jessy Oto, M.D.   DATE OF BIRTH:  Feb 21, 1951   DATE OF PROCEDURE:  06/02/2006  DATE OF DISCHARGE:                               OPERATIVE REPORT   PREOPERATIVE DIAGNOSIS:  Torn medial meniscus, left knee.   POSTOPERATIVE DIAGNOSIS:  Torn medial meniscus, left knee.   PROCEDURE:  Left knee arthroscopy with partial medial meniscectomy.   SURGEON:  Jessy Oto, M.D.   ASSISTANT:  Epimenio Foot, P.A.-C.   ANESTHESIA:  General via orotracheal intubation, supplemented with local  infiltration left knee block with Marcaine and lidocaine, infiltration  of the knee portals, inferomedial, inferolateral and anterior, with  Marcaine 0.5% with 1:200,000 epinephrine, 10 mL.   DRAINS:  None.   FINDINGS:  The patient was found to have a complex tear, degenerative in  nature, involving the posterior horn of the medial meniscus, left knee.  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   DESCRIPTION OF PROCEDURE:  After adequate general anesthesia, the left  lower extremity had a tourniquet placed about the upper thigh, a leg  holder in place.  The right leg was wrapped and well padded.  The left  lower extremity was then prepped from the ankle to the upper thigh with  DuraPrep solution and draped in the usual manner.   The patient had inflation of the knee with approximately 40 mL of saline  solution.  Infiltration in the responsive portal sites, both  inferomedial, inferolateral and anteriorly, and then a stab incision  made into the anterolateral aspect of the knee portal site.  A blunt  trocar was then used to enter the left knee laterally, and then the  sleeve for the arthroscopic camera was then inserted into place and the  camera then inserted.  Evaluation of the medial recess demonstrated some  synovitis changes.  The medial joint line was evaluated, showing grade 4  and grade 5 changes of chondromalacia on the medial femoral condyle,  both the weightbearing surface and the lateral aspect of the femoral  condyle.  There was a definite torn meniscus demonstrated, with part of  the meniscus entrapped between the condyles of the medial compartment.   This was evaluated microscopically and micrographs were then obtained to  document the torn meniscus, as well as the degenerative changes  involving the medial femoral condyle.   Infiltration was then carried out in the expected placement of a portal  medially.  A spinal needle was used to discern the appropriate placement  for the portal, and an 11-blade scalpel was used to make a stab incision  in the joint line and the synovium penetrated using a blunt trocar.  A  probe was then used to carefully probe the meniscus and demonstrated a  tear involving the meniscal rim posteriorly, and the entire posterior  horn was degenerated and frayed, showing particulate matter that was  still attached, falling between the condyles within the medial  compartment.  First, a shaver was placed into the knee joint through the  medial portal, and then used to shave synovium anteriorly to allow for a  better exposure and evaluation of this area.  It was then used to shave  the posterior rim.  Baskets were then inserted and were used to further  debride torn meniscus back to a rim of fibrous tissue, and residual  meniscus was felt to be stable.  This was then shaved along the entire  posterior horn of the medial meniscus back to its attachment.  The  attachment itself was carefully trimmed and smoothed using a basket, and  upbiting baskets as well.  I then debrided further using the full-radius  shaver, 3.5  debrider, and once this was completed, then the end of the  femur was carefully smoothed and a chondroplastic shaving carried out.  With this completed, then, the knee was irrigated with copious amounts  of irrigant solution, at least 500 mL of irrigant solution.  The portals  were then removed after first expressing the knee joint of any further  irrigant solution remaining.  With this completed, both the medial and  lateral portal sites were closed with interrupted subcu sutures of 3-0  Vicryl.  The skin was closed with Steri-Strips, and 4 x 4's and ABD pads  affixed to the skin with Kerlix.  An Ace wrap was applied.  The patient  was then reactivated, extubated and returned to the recovery room in  satisfactory condition.   POSTOPERATIVE CARE:  The patient will use crutches, weightbear as  tolerated on the left lower extremity, and he will take Vicodin for  discomfort, 1 to 2 q.4-6 h. p.r.n. pain, maximum of 8 per day, aspirin  325 mg 1 tablet p.o. b.i.d., Mobic 15 mg 1 tablet p.o. q.d.  He will be  seen back in my office in 2 weeks for a followup visit.  He will be seen  back otherwise if he has any fever, chills, increasing worsening pain or  discomfort.      Jessy Oto, M.D.  Electronically Signed     JEN/MEDQ  D:  06/02/2006  T:  06/02/2006  Job:  [id number]

## 2010-10-11 NOTE — Op Note (Signed)
NAMENICHALOUS, THEIL                        ACCOUNT NO.:  [id number]   MEDICAL RECORD NO.:  [id number]                   PATIENT TYPE:  OIB   LOCATION:  5011                                 FACILITY:  [facility]   PHYSICIAN:  Jessy Oto, M.D.                DATE OF BIRTH:  [birth year]   DATE OF PROCEDURE:  12/14/2003  DATE OF DISCHARGE:                                 OPERATIVE REPORT   ADDENDUM:  Addendum to an operative note.  The surgery note is listed as work number  [id number].   The operating microscope was used during this procedure.  It was draped  sterilely and brought into the field following the initial entry into the  spinal canal by performing excision of ligamentum flavum.  Microscope was  then brought in and used for the remaining portion of the case.                                               Jessy Oto, M.D.    Liz Malady  D:  12/14/2003  T:  12/14/2003  Job:  [id number]

## 2010-10-11 NOTE — Op Note (Signed)
[name], [name]            ACCOUNT NO.:  [id number]   MEDICAL RECORD NO.:  [id number]          PATIENT TYPE:  OIB   LOCATION:  5003                         FACILITY:  [facility]   PHYSICIAN:  Jessy Oto, M.D.   DATE OF BIRTH:  Apr 11, 1951   DATE OF PROCEDURE:  09/08/2005  DATE OF DISCHARGE:                                 OPERATIVE REPORT   PREOPERATIVE DIAGNOSES:  1.  Left shoulder torn rotator cuff.  2.  Impingement syndrome, left shoulder.   POSTOPERATIVE DIAGNOSES:  1.  Left shoulder torn rotator cuff.  2.  Impingement syndrome, left shoulder.   PROCEDURE:  Left shoulder open acromioplasty and repair of rotator cuff tear  sutured to trough, a dime-sized supraspinatus tear repaired with two  modified Kessler sutures, pulling the tendon into trough.   SURGEON:  Jessy Oto, M.D.   ASSISTANT:  Epimenio Foot, P.A.   ANESTHESIA:  General via orotracheal intubation, Sharolyn Douglas, M.D.   ESTIMATED BLOOD LOSS:  25-30 mL.   COMPLICATIONS:  None.   The patient returned to the PACU in good condition.   HISTORY OF PRESENT ILLNESS:  The patient a 60 year old right-hand dominant  male who has been experiencing pain and discomfort into his left shoulder.  He has a type 2 acromion process.  Pain did not improve with multiple  injections and attempts at conservative management, including therapy and an  exercise program.  The patient underwent an MRI study, which demonstrated a  type 2 acromion process with a nonretracted tear of the supraspinatus  tendon.  Brought to the operating room to undergo a repair of rotator cuff,  left side, with subacromial decompression.  The patient's radiographs of his  AC joint do not demonstrate a significant arthrosis.  He is nontender in  this area.   INTRAOPERATIVE FINDINGS:  The patient was found to have impingement syndrome  with very little subacromial space and the anterior leading edge of the  acromion process impinging on  the greater tuberosity.  Acromioplasty was  performed open and then repair of the rotator cuff tear involving the  leading edge of the supraspinatus tendon, about dime-size, 12 mm or so in  length.  This was nonretracted and did repair into a trough without  significant tension with the arm at the side.   DESCRIPTION OF PROCEDURE:  After adequate general anesthesia, the patient  had preanesthesia, a scalene block on the left side.  Standard prep with  DuraPrep solution and standard preoperative antibiotics.  He was in the  Schlein shoulder frame.  He was raised to about 30 degrees, beach-chair  position.  All pressure points well-padded.  Standard prep with DuraPrep  solution of the left shoulder and lateral neck, anterior chest, axillary  region and over the left scapula, continued circumferentially down the arm  to the midforearm and the wrist level.  Draped in the usual manner, iodine  Vi-Drape was used.  The skin infiltrated off the lateral aspect of the  acromion process anteriorly using Marcaine 0.5% plain, identifying the  anterior lateral edge of the acromion process.  The incision  approximately  4.5-5 cm length through the skin and subcutaneous layers down to the  superficial fascia overlying the deltoid.  An incision then made into the  anterior third raphe of the deltoid, carried up over the superior aspect of  the acromion process, then subperiosteal dissection carried both anterior  medial and anterior lateral, exposing the anterior aspect of the acromion  process.  Care taken not to injure the rotator cuff with the incision here,  retracting on the arm and then placing a Cobb elevator underneath the  leading edge of the acromion process in order to protect this while exposing  the anterior aspect of the rotator cuff and the acromion process.  Next a  three-quarter inch osteotome was then positioned near the superior and  inferior plane and used to remove a wafer of bone a  centimeter thick  anteriorly, beveling posteriorly.  This was then removed using Beyer rongeur  and carefully debrided the edges laterally and medially.  A high-speed bur  then used to carefully thin the undersurface of the acromion process both  lateral and medial, no significant bleeding noted with this so no bone wax  was used.  The subacromial bursa identified.  This was resected off the  superior portion of the rotator cuff and off of the subdeltoid region using  Metzenbaum scissors.  Hypertrophic portions were removed without difficulty  and the shoulder externally rotated to identify the supraspinatus tendon and  its tear, about dime sized, crescent shaped, off of the superior portion of  the patient's greater tuberosity.  Using a 15 blade scalpel, the edges of  the minimally-retracted rotator cuff tear were then freshened to bleeding  tendinous surface.  Two #1 Ethibond sutures were used to place two modified  Kessler sutures, a total of four endings present, two in the middle and one  on each end.  A medium-sized shoulder Concept tray.  Lewin-type clamps were  then used to make three openings into a trough that was placed into the area  of the superior aspect of the greater tuberosity and its border with the  osteochondral border of the humeral head using a three-quarter inch  osteotome.  This was deepened to accept the rotator cuff.  After making  holes, then a suture passer was then used to pass two sutures centrally,  marking the anterior one with a knot so that it would be used for proper  attachment to them, then the posterior one was similarly marked.  Sutures  were then passed through separate holes, two central and one anterior and  one posterior.  The shoulder abducted and externally rotated, then the  rotator cuff was then carefully passed into the trough and then sutures were  then tied laterally.  This placed the rotator cuff tear squarely into the trough and excellent  apposition and the shoulder was able to be abducted,  demonstrating no impingement further on the rotator cuff.  The patient's  repair site did not require any future suture repair here.   Irrigation was performed.  The [facility] joint could not be adequately visualized  through the wound but did not demonstrate significant compression on the  rotator cuff as it coursed medially and superior to the humeral head.  Next  the patient had repair of his deltoid tendon, the tendinous portion of it  over the acromion process using interrupted 0 Vicryl sutures.  The  superficial fascial layer of the deltoid was approximated with a running  stitch of 2-0  Vicryl.  Subcu layer was approximated with interrupted 2-0  Vicryl sutures and the skin closed with Dermabond, obtaining good close  adhesion.  The subacromial space was then re-infiltrated with 5 mL of  Marcaine 05% plain.  A dry dressing and 4 x 4's fixed to the skin with  Hypafix tape.  The patient was then placed into a shoulder immobilizer, was  reactivated, extubated and returned to the recovery room in satisfactory  condition.  All instrument and sponge counts were correct.      Jessy Oto, M.D.  Electronically Signed     JEN/MEDQ  D:  09/08/2005  T:  09/09/2005  Job:  [id number]

## 2010-10-11 NOTE — Op Note (Signed)
[name], [name]            ACCOUNT NO.:  [id number]   MEDICAL RECORD NO.:  [id number]          PATIENT TYPE:  AMB   LOCATION:  SDS                          FACILITY:  [facility]   PHYSICIAN:  Nelda Severe. Kellie Simmering, M.D.  DATE OF BIRTH:  16-Mar-1951   DATE OF PROCEDURE:  01/29/2006  DATE OF DISCHARGE:                                 OPERATIVE REPORT   PREOPERATIVE DIAGNOSIS:  Recurrent stenosis right common iliac artery at  previous stent site with recurrent claudication, right leg.   PROCEDURE:  1. Abdominal aortogram via right common femoral approach.  2. PTA of in-stent stenosis right common iliac artery using 8 x 3      Powerflex catheter inflated to 10 atmospheres for 50 seconds with      completion angiogram.  3. Deployment of Genesis on Opta 8 x 18 stent at proximal aspect of      previous stent with slight overlap, force intimal tear, inflated at 10      atmospheres for 30 seconds.  4. Completion angiogram with excellent cosmetic result and resolution of      stenosis in right common iliac artery.   SURGEON:  Dr. Kellie Simmering   ANESTHESIA:  Local Xylocaine.   HEPARIN:  4000 units.   CONTRAST:  80 mL.   COMPLICATIONS:  None.   DESCRIPTION OF PROCEDURE:  The patient was taken to [facility] peripheral  endovascular lab, placed in supine position at which time both groins were  prepped with Betadine solution and draped in routine sterile manner.  After  infiltration of 1% Xylocaine, right common femoral artery was entered  percutaneously.  Guidewire passed into the suprarenal aorta under  fluoroscopic guidance.  A 6-French sheath and dilator were passed over the  guide wire, dilator removed.  The standard pigtail catheter positioned in  the suprarenal aorta.  A flush abdominal aortogram was performed by  injecting 20 mL of contrast at 20 mL per second.  This revealed the aorta to  be widely patent with single renal arteries bilaterally with no significant  stenosis.  The aorta  had mild atherosclerotic changes with a patent inferior  mesenteric artery.  The left common iliac, internal and external iliac  arteries were all widely patent with no evidence of significant stenosis.  On the right side there had been a previous stent placed in the distal right  common iliac artery and there was a recurrent stenosis primarily within the  stent causing approximate 80% narrowing over a 2 cm segment.  This  terminated just distal to the stent where the internal iliac artery  originated.  The external iliac artery was widely patent on the right.  It  was felt that attempt would be made initially to redilate this stent.  Therefore, 4000 units of heparin was given intravenously and an 8 x 3  Powerflex catheter was advanced into the stented area localizing this with a  Glow 'N Tell catheter.  There was a single inflation done at 10 atmospheres  for 50 seconds extending both proximal and distal to the previous stent.  Completion angiogram was performed through the  pigtail catheter which  revealed the resolution of the stenosis within the stent and good filling of  the internal and external iliac arteries distally with no evidence of  dissection.  There was what appeared to be a small intimal tear at the  proximal aspect of the stent extending to the patient's right lateral side.  It was decided to cover this with a second stent.  Therefore an 8 x 18  Genesis on Opta stent was advanced and positioned appropriately, inflated at  10 atmospheres for 30 seconds and then a completion angiogram revealed  complete resolution of the intimal tear with a good cosmetic result and  resolution of the stenosis.  Having tolerated procedure well.  The sheath  was removed after the heparin was normalized, adequate compression applied.  No complications ensued.   FINDINGS:  1. Widely patent aorta and left iliac system.  2. Recurrent in-stent stenosis of right common iliac artery causing       approximate 80% narrowing.  3. Successful PTA of in-stent stenosis right common iliac artery with a      localized intimal tear at proximal aspect of stent.  4. Deployment of an 8 x 18 stent to the previous stent with slight overlap      of intimal tear and good cosmetic result.           ______________________________  Nelda Severe. Kellie Simmering, M.D.     JDL/MEDQ  D:  01/29/2006  T:  01/29/2006  Job:  [id number]

## 2010-10-11 NOTE — Op Note (Signed)
[name], [name]                        ACCOUNT NO.:  [id number]   MEDICAL RECORD NO.:  [id number]                   PATIENT TYPE:  OIB   LOCATION:  2899                                 FACILITY:  [facility]   PHYSICIAN:  [name] Severe. Kellie [name], M.D.               DATE OF BIRTH:  [birth year]   DATE OF PROCEDURE:  10/05/2003  DATE OF DISCHARGE:                                 OPERATIVE REPORT   PREOPERATIVE DIAGNOSIS:  Right iliac occlusive disease with limiting  claudication.   POSTOPERATIVE DIAGNOSIS:  Right iliac occlusive disease with limiting  claudication.   OPERATION PERFORMED:  1. Abdominal aortogram with bilateral lower extremity runoff via right     common femoral approach.  2. Percutaneous transluminal angioplasty and stenting using an 8 x 24     Genesis on Opta of right common iliac artery at 10 atmospheres for 45     seconds with a second percutaneous transluminal angioplasty of right     common iliac artery at 7 atmospheres for 30 seconds.   SURGEON:  [name] Severe. Kellie [name], M.D.   ANESTHESIA:  Local Xylocaine, Versed 2 mg intravenously, heparin 3000 units,  contrast 210 mL.   COMPLICATIONS:  None.   DESCRIPTION OF PROCEDURE:  The patient was taken to the [facility]  peripheral endovascular lab and placed in supine position at which time both  groins were prepped with Betadine scrub and solution and draped in routine  sterile manner.  After infiltration of 1% Xylocaine the right common femoral  artery was entered percutaneously.  Guidewire passed into the suprarenal  aorta under fluoroscopic guidance.  A 5 French sheath and dilator passed  over the guidewire and the dilator removed.  A standard pigtail catheter was  positioned in the suprarenal aorta and flush abdominal aortogram performed  injecting 20 mL of contrast at 20 mL per second.  This revealed the aorta to  be widely patent with single renal arteries bilaterally.  The inferior  mesenteric artery was widely patent.   There was mild plaque formation in the  distal aorta but no stenosis.  The right common iliac artery had a 90% focal  stenosis approximately 3 to 4 cm distal to the origin just proximal to the  origin of the internal iliac artery. The right internal and external iliac  arteries were widely patent.  The left iliac system also was widely patent  with no areas of stenosis.  Catheter was withdrawn into the terminal aorta  and bilateral lower extremity runoff performed injecting 88 mL of contrast  at 8 mL per second.  This revealed bilaterally the common femoral, profunda  femoris, superficial femoral, popliteal and tibial vessels to be relatively  normal with no evidence of significant narrowing or plaque formation.  It  was decided to proceed with right iliac percutaneous transluminal  angioplasty and stenting and RAO and LAO projections were obtained at 15 mL  injections on  both sides to better delineate the lesion.  The patient was  then given 3000 units of heparin intravenously and the 5 [place] sheath  exchanged for a long 6 [place] sheath.  A Genesis on Opta system at 8 mm x 24  mm was utilized and positioned appropriately through the sheath.  The sheath  withdrawn and inflation at 10 atmospheres 45 seconds performed.  A post PTA  angiogram performed which revealed some mild shelfing at the upper level of  the stent but no significant stenosis.  One additional inflation was done in  this area at 7 atmospheres for 30 seconds with improvement of this area and  no residual stenosis noted.  Having tolerated the procedure well the sheath  was removed after the ACT was corrected.  Adequate compression was applied,  no complications ensued.   FINDINGS:  1. 90% right common iliac artery stenosis with otherwise normal distal     runoff.  2. Widely patent aorta and left iliac system.  Successful percutaneous     transluminal angioplasty and stenting of the right common iliac artery     with  Genesis on Opta system (8 mm x 24 mm) at 10 atmospheres for 45     seconds with a second percutaneous transluminal angioplasty at 7     atmospheres for 30 seconds.                                               [name], M.D.    JDL/MEDQ  D:  10/05/2003  T:  10/05/2003  Job:  [id number]
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
## 2010-10-11 NOTE — Assessment & Plan Note (Signed)
[facility] OFFICE NOTE   Charlis, Stuchell LEMARIO GIARRAPUTO                   MRN:          [id number]  DATE:09/01/2006                            DOB:          [birth year]    [name] returns today because of rapid heart beat and chest pain.   He has noted over the last few days going in and out of a fast heart  beat. He denies any shortness of breath or angina. He has had some sharp  pains in his chest which have moved now to his left breast.   EKG today shows an atrial flutter at 140 beats per minute.   He admits to missing his morning dose of Lopressor. He is supposed to be  on 25 b.i.d.  He is also on:  1. Aspirin 81 mg a day.  2. Lovastatin 20 mg q.h.s.  3. Mobic 15 mg a day.  4. Xanax 0.5 b.i.d.  5. OxyContin 40 mg t.i.d.   He denies any orthopnea, PND or peripheral edema. His heart rhythm is  usually back in normal rhythm in the mornings. His heart rates in the  mornings are about 74.   PHYSICAL EXAMINATION:  VITAL SIGNS:  His blood pressure today is 98/62,  his pulse is 140. EKG shows atrial flutter with an incomplete right  bundle and some ST segment flattening and T wave inversion in 1, AVL, V4  through 6.  GENERAL:  He is in no acute distress.  SKIN:  Warm and dry.  HEENT:  Face is ruddy complexion. PERRLA, extraocular movements intact,  sclera clear. Facial symmetry is normal.  NECK:  Carotids are full. There is no JVD. Thyroid is not enlarged.  Trachea is midline.  HEART:  The sternotomy site is healing well and is stable. Heart reveals  a rapid rate and rhythm.  LUNGS:  Clear to auscultation and percussion.  ABDOMEN:  Soft with good bowel sounds. There is no midline bruit.  EXTREMITIES:  Reveal no edema. Pulses are intact.   Chest x-ray shows no acute cardiopulmonary disease, status post  sternotomy. There is no failure.   ASSESSMENT/PLAN:  I have had about a 20-minute discussion with Mr.  Solan and his wife. I have advised hospitalization if his atrial  flutter persists more than 24 hours, certainly no more than 48 hours. He  is convinced that if he takes his Lopressor that this will keep it in  normal rhythm.   He has also been drinking a fair amount of caffeinated beverages and  actually came to the office with [facility]. I warned him of this as  well.   PLAN:  1. Increase aspirin to 325 a day.  2. When he gets home this evening take 50 mg of Lopressor. If he wakes      up in the morning and his heart rate is still 140, his wife is to      bring him to the hospital. If not, he can stay on 25 b.i.d. I am      going to schedule followup here in the office  in a week for      followup.     Thomas C. Verl Blalock, MD, [facility]  Electronically Signed    TCW/MedQ  DD: 09/01/2006  DT: 09/02/2006  Job #: [id number]   cc:   Valentina Gu. Roxy Manns, M.D.  Osvaldo Human, M.D.

## 2010-10-11 NOTE — Cardiovascular Report (Signed)
[name], [name] NO.:  [id number]   MEDICAL RECORD NO.:  [id number]          PATIENT TYPE:  OIB   LOCATION:  1966                         FACILITY:  [place]   PHYSICIAN:  Satira Sark, MD DATE OF BIRTH:  [birth year]   DATE OF PROCEDURE:  DATE OF DISCHARGE:  08/07/2006                            CARDIAC CATHETERIZATION   PRIMARY CARDIOLOGIST:  Jenell Milliner, M.D.   PRIMARY CARE PHYSICIAN:  Osvaldo Human, M.D.   INDICATIONS:  Mr. Stanberry is a 60 year old male with a history of  ongoing tobacco use, hyperlipidemia, hypertension, family history of  cardiovascular these, and peripheral arterial disease, status post right  common iliac artery stent placement in the past.  He has had recent  symptoms concerning for angina, and is referred now for a diagnostic  cardiac catheterization to clearly define the coronary anatomy and  assess for any potential revascularization options.  The risks and  benefits were explained to the patient in advance and informed consent  was obtained.   PROCEDURES PERFORMED:  1. Left heart catheterization.  2. Selective coronary angiography.  3. Left ventriculography.   ACCESS/EQUIPMENT:  The area about the right femoral artery was  anesthetized with 1% lidocaine and a 4-French sheath was placed in the  right femoral artery via the modified Seldinger technique, following a  single anterior wall puncture.  Standard preformed 4-French JL-4 and JR-  4 catheters were used for selective coronary angiography and an angled  pigtail catheter was used for the left heart catheterization, left  ventriculography.  A total of 120 mL Omnipaque were used.  The patient  tolerated procedure well.  All exchanges were made over a wire.   HEMODYNAMIC RESULTS:  Left ventricle 110/13-mmHg, aorta 109/64-mmHg.   ANGIOGRAPHIC FINDINGS:  1. The left main coronary artery is calcified and gives rise to the      left anterior descending and circumflex  coronary arteries.  No flow-      limiting stenosis is noted within the left main proper.  2. The left anterior descending is a medium caliber vessel with four      relatively small diagonal branches.  Within the ostial portion of      the left anterior descending is an eccentric stenosis,      approximately 70-80%, with calcification also noted.  This lesion      is very close to the left main.  Otherwise, there are luminal      irregularities at approximately 20-30% in the mid left anterior      descending.  The second diagonal branch is fairly small and has an      ostial 70% stenosis.  3. The circumflex coronary artery is a medium caliber vessel which      gives rise to three obtuse marginal branches.  The first of which      is the largest and trifurcates.  There are luminal irregularities      noted including a 30% proximal circumflex stenosis, and 30-40% mid      to distal stenosis.  4. The right coronary artery is a small to medium caliber dominant  vessel with small posterior descending branch.  There is      approximately 40% stenosis noted within the mid vessel segment and      30% stenosis distally.   Left ventriculography was performed in the RAO projection and reveals an  ejection fraction, approximately 65% with no focal wall motion  abnormality and no significant mitral regurgitation.   DIAGNOSES:  1. Single-vessel obstructive coronary artery disease with a calcified      eccentric ostial left anterior descending stenosis, graded at      approximately 70-80%.  Otherwise, there is mild to moderate      atherosclerosis noted.  2. Left ventricular ejection fraction approximately 65% with no focal      wall motion abnormality, and a left ventricular end-diastolic      pressure of 13-mmHg.  No significant aortic valve gradient noted.      No significant mitral regurgitation noted.   DISCUSSION:  I reviewed the results in detail with the patient and his  family.  I  also went over the films with Dr. Albertine Patricia.  Based on the  coronary anatomy and proximity of the left anterior descending stenosis  to the left main, one could certainly consider coronary artery bypass  grafting with placement of a LIMA to the left anterior descending.  Percutaneous intervention, although perhaps more involved given the  anatomy of the disease, could also be considered.  I reviewed these  options with the patient and also discussed the situation by phone with  Dr. Verl Blalock.  The plan at this point is to seek a CVTS consultation which  has been arranged for Monday, to discuss the possibility of bypass  surgery.  This may be the best approach as far as long term results and  the patient at this point seems comfortable with the idea.  Imdur will  be added to his baseline regiment, which already includes beta-blocker,  aspirin, and Statin therapy.      Satira Sark, MD  Electronically Signed     SGM/MEDQ  D:  08/07/2006  T:  08/08/2006  Job:  [id number]   cc:   Thomas C. Wall, MD, [facility]

## 2010-10-11 NOTE — Consult Note (Signed)
[facility]  Patient:    [name], [name]                   MRN: [id number] Proc. Date: 12/31/00 Adm. Date:  [id number] Attending:  Nicholaus Bloom CC:         Lindwood Qua, M.D.   Consultation Report  HISTORY OF PRESENT ILLNESS:  [name] comes in for follow-up evaluation of his chronic neck and shoulder pain on the basis of cervical spondylosis and probable cervical spine postlaminectomy pain syndrome.  Since his last evaluation, his pain level has been relatively stable on Duragesic patches. He has been using them every three days and notices that on the third day he has a lot of breakthrough discomfort.  He has gone off of the Valium.  He does note some sleepiness from the Duragesic but otherwise is tolerating it well.  He denies any new neurologic symptoms.  He continues to have pain, predominantly in his neck, radiating out to the right upper extremity and, to a lesser extent, radiating out to the coccyx region.  It is made worse by overexertion and improved by rest.  PHYSICAL EXAMINATION:  VITAL SIGNS:  Blood pressure 112/69, heart rate 85, respiratory rate 18, O2 saturations 96%.  Pain level is 4/10.  NECK:  He has crepitus on range of motion of his neck with moderate restriction of range of motion.  NEUROLOGIC:  Deep tendon reflexes were diminished at the right triceps relative to the left at 1+ versus 2 to 3+ on the left.  Triceps and brachioradialis were intact.  Deep tendon reflexes of lower extremities were intact.  Straight leg raise signs were negative.  He continues to have mild weakness and mild atrophy of the right deltoid and biceps relative to the left.  IMPRESSION: 1. Chronic neck pain with underlying history of cervical degenerative disk    disease and probable element of postlaminectomy pain syndrome. 2. Other medical problems per Dr. Roderick Pee.  DISPOSITION: 1. The patient denies anxiety at this time and denied  that he had an anxiety    disorder, and I advised him that it is conceivable that he might tolerate    low doses of Ritalin for his sleepiness.  We have gone ahead and placed him    on Ritalin 10 mg 1/2 tablet in the morning and 1/2 tablet at noon, #30 with    no refill. 2. Increased frequency of Duragesic to 25 mcg every two days, #15. 3. He wants me to assume writing for his opiates prescriptions, and we will    go ahead and do this and forward a copy of this note to Dr. Colvin Caroli    office so he is aware we are doing this.  He is aware that he cannot get    opiates from Dr. Roderick Pee for the time being.  FOLLOW-UP:  I plan to see him in follow-up in about three months.  He is to call if he has problems between now and then.  If he gets anxiety from the current medications, he is to let us know. DD:  12/31/00 TD:  12/31/00 Job: [id number]
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
## 2010-10-11 NOTE — Op Note (Signed)
[name], [name]            ACCOUNT NO.:  [id number]   MEDICAL RECORD NO.:  [id number]          PATIENT TYPE:  INP   LOCATION:  2010                         FACILITY:  [facility]   PHYSICIAN:  Champ Mungo. Lovena Le, MD    DATE OF BIRTH:  1951-01-05   DATE OF PROCEDURE:  09/04/2006  DATE OF DISCHARGE:                               OPERATIVE REPORT   PROCEDURE PERFORMED:  Electrophysiologic study and RF catheter ablation  of atrial flutter.   INTRODUCTION:  The patient is a 60 year old man with a history of  coronary disease status post bypass surgery who presented to the  hospital with symptomatic atrial flutter was found to be in atrial  flutter with rapid ventricular response.  He was treated with AV nodal  blocking drugs, heparin and Coumadin.  He underwent transesophageal echo  demonstrating no left atrial appendage thrombus.  He is subsequently now  referred for electrophysiologic study and catheter ablation.   PROCEDURE:  After informed consent was obtained, the patient was taken  to the diagnostic EP lab in the fasting state.  After usual preparation  and draping, intravenous fentanyl and midazolam was given for sedation.  A 6-French hexapolar catheter was inserted percutaneously into the right  jugular vein and advanced to the coronary sinus.  A 7-French 20 pole  halo catheter was inserted percutaneously into the right femoral vein  and advanced to the right atrium.  A 5-French quadripolar catheter was  inserted percutaneously in the right femoral vein and advanced to right  atrium.  After measurement of the basic intervals, mapping was carried  out demonstrating typical counterclockwise tricuspid annular reentrant  atrial flutter.  A 7-French quadripolar ablation catheter was then  maneuvered into the right atrium and mapping carried out.  The atrial  activation was counterclockwise along tricuspid valve annulus and  earliest in the proximal CS compared to the distal CS.  All  the above  confirmed the diagnosis of atrial flutter (typical), and the 7-French  quadripolar ablation catheter was maneuvered into the atrial flutter  isthmus.  During the second RF energy application, atrial flutter was  terminated, and sinus rhythm was restored.  A total of seven additional  RF energy applications were delivered.  During the seventh RF energy  application, atrial flutter isthmus block was made permanently, and two  bonus RF energy applications were then delivered, and the patient was  observed for 30 minutes at which time had no atrial flutter isthmus  conduction.  At this point, rapid ventricular pacing was carried out  from the RV apex and stepwise decreased down to 400 milliseconds where  VA Wenckebach was observed.  During rapid ventricular pacing, atrial  activation was midline and decremental.  Next, programmed ventricular  stimulation was carried out the RV apex at base drive cycle length of  500 milliseconds.  The S1/S2 interval was stepwise decreased down to 420  milliseconds where a retrograde AV node ERP was observed.  During  programmed ventricular stimulation, the atrial activation was midline  and decremental.  Next, rapid atrial pacing was carried out from the  coronary sinus and stepwise  decreased down to 340 milliseconds where AV  Wenckebach was observed.  During rapid atrial pacing, the PR interval  was less than the RR interval, and there was no inducible SVT.  Next,  programmed atrial stimulation was carried out from the coronary sinus at  a base drive cycle length of 500 milliseconds.  The S1/S2 interval  stepwise decreased from 440 milliseconds down to 300 milliseconds where  the AV node ERP was observed.  During programmed ventricular  stimulation, the atrial activation sequence was midline and decremental.  The catheters were removed, hemostasis was assured, and the patient was  returned to his room in satisfactory condition.   COMPLICATIONS:   There were no immediate procedure complications.   RESULTS:  1. Baseline ECG.  The baseline ECG demonstrates atrial flutter with      controlled ventricular response.  2. Baseline intervals.  The atrial flutter cycle length was 213      milliseconds.  The HV interval was 38 milliseconds following      ablation.  Sinus node cycle length after ablation was 943      milliseconds.  3. Rapid ventricular pacing.  Rapid ventricular pacing was carried out      from the RV apex at 500 milliseconds and stepwise decreased down to      400 milliseconds where VA Wenckebach was observed.  During rapid      ventricular pacing, the atrial activation was midline and      decremental.  4. Deep programmed ventricular stimulation.  Programmed ventricular      stimulation was carried out from the RV apex at a base drive cycle      length of 500 milliseconds.  The S1/S2 interval was stepwise      decreased down to 420 milliseconds where a retrograde AV node ERP      was observed.  During programmed ventricular stimulation, the      atrial activation was midline and decremental.  5. Rapid atrial pacing.  Rapid atrial pacing was carried out from the      high right atrium and coronary sinus at a base drive cycle length      of 500 milliseconds and stepwise decreased down to 340 milliseconds      where AV Wenckebach was observed.  During rapid atrial pacing, the      PR interval was less than the RR interval, and there was no      inducible SVT.  6. Programmed atrial stimulation.  Programmed atrial stimulation was      carried out from the high right atrium of the coronary sinus at a      base drive cycle length of 500 milliseconds.  The S1/S2 interval      was stepwise decreased down to 300 milliseconds where AV node ERP      was observed.  During programmed atrial stimulation, there no AH      jumps and no echo beats. 7. Arrhythmias observed.  Atrial flutter initiation, rapid __________      initiation  present at the time of EP study, duration was sustained,      termination was catheter ablation  8. Mapping.  Mapping of atrial flutter isthmus demonstrated usual size      and orientation.  9. RF energy application.  There were a total of 9 RF energy      applications delivered.  During the second RF energy application,      atrial flutter was terminated and  sinus rhythm restored.  Seven      additional RF energy applications including two bonus RF energy      applications were then delivered resulting in creation of atrial      flutter isthmus block.   CONCLUSION:  Study demonstrates successful electrophysiologic study and  RF catheter ablation of typical atrial flutter with a total of 9 RF  energy applications delivered at the usual atrial flutter isthmus  resulting in termination of atrial flutter, restoration of sinus rhythm,  and creation of bidirectional block and the atrial flutter isthmus.      Champ Mungo. Lovena Le, MD  Electronically Signed     GWT/MEDQ  D:  09/04/2006  T:  09/04/2006  Job:  [id number]   cc:   Thomas C. Verl Blalock, MD, [name]  Osvaldo Human, M.D.
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
## 2010-10-11 NOTE — Assessment & Plan Note (Signed)
[facility] OFFICE NOTE   CAYDE, MASLEY                   MRN:          [id number]  DATE:09/09/2006                            DOB:          11/04/1950    Mr. Muckleroy returns today after being admitted to the hospital with  atrial flutter with a rapid ventricular response.   He underwent a TEE followed by an electrophysiology study and RF  ablation of atrial flutter by Dr. Lovena Le.  He was discharged home and  has done remarkably well.   The Lopressor he is taking, which is 25 b.i.d., is bottoming his  pressure out.  His pressure is normal otherwise, and his heart rates are  running in the 70s.  He is feeling remarkably better.   His only cardiac meds are Lovastatin 20 mg a day and aspirin 81 mg a  day.   His blood pressure today is 130/77, his pulse is 73 and regular, his  weight is 166.  He is in no acute distress.  His carotid upstrokes are  equal bilaterally without bruits, no JVD, thyroid is not enlarged,  trachea is midline.  Lungs are clear.  Heart reveals a regular rate and  rhythm.  Abdomen is soft.  Groin site is stable.  Extremities reveal no  edema.  Pulses are intact.   I am delighted with how Mr. Jensen is doing.  We will continue the  Lovastatin and the aspirin.  I will see him back in two months.     Thomas C. Verl Blalock, MD, [facility]  Electronically Signed    TCW/MedQ  DD: 09/09/2006  DT: 09/09/2006  Job #: [id number]   cc:   Osvaldo Human, M.D.
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
## 2010-10-13 ENCOUNTER — Encounter: Payer: Self-pay | Admitting: Internal Medicine

## 2010-10-13 NOTE — Assessment & Plan Note (Signed)
With apparent persistent fluid we discussed pleural thickening vs ongoing process and will get repeat thoracentesis. If fluid benign with rapid return, consider drainage if mechanically important due to space occupying and effect on dyspnea.

## 2010-10-14 ENCOUNTER — Ambulatory Visit (HOSPITAL_COMMUNITY)
Admission: RE | Admit: 2010-10-14 | Discharge: 2010-10-14 | Disposition: A | Discharge status: Home / Self Care | Payer: 59 | Source: Ambulatory Visit | Attending: Internal Medicine | Admitting: Internal Medicine

## 2010-10-14 ENCOUNTER — Other Ambulatory Visit: Payer: Self-pay | Admitting: Internal Medicine

## 2010-10-14 ENCOUNTER — Other Ambulatory Visit: Payer: Self-pay | Admitting: Diagnostic Radiology

## 2010-10-14 DIAGNOSIS — J90 Pleural effusion, not elsewhere classified: Secondary | ICD-10-CM

## 2010-10-14 DIAGNOSIS — C3490 Malignant neoplasm of unspecified part of unspecified bronchus or lung: Secondary | ICD-10-CM | POA: Insufficient documentation

## 2010-10-15 LAB — PATHOLOGIST SMEAR REVIEW

## 2010-10-18 ENCOUNTER — Encounter: Payer: Self-pay | Admitting: Cardiology

## 2010-10-18 ENCOUNTER — Inpatient Hospital Stay (HOSPITAL_COMMUNITY)
Admission: EM | Admit: 2010-10-18 | Discharge: 2010-10-20 | DRG: 312 | Disposition: A | Discharge status: Home / Self Care | Payer: 59 | Attending: Internal Medicine | Admitting: Internal Medicine

## 2010-10-18 ENCOUNTER — Emergency Department (HOSPITAL_COMMUNITY): Payer: 59

## 2010-10-18 ENCOUNTER — Telehealth: Payer: Self-pay | Admitting: *Deleted

## 2010-10-18 ENCOUNTER — Ambulatory Visit: Payer: 59 | Admitting: Cardiology

## 2010-10-18 DIAGNOSIS — Z79899 Other long term (current) drug therapy: Secondary | ICD-10-CM

## 2010-10-18 DIAGNOSIS — Z87891 Personal history of nicotine dependence: Secondary | ICD-10-CM

## 2010-10-18 DIAGNOSIS — Z85118 Personal history of other malignant neoplasm of bronchus and lung: Secondary | ICD-10-CM

## 2010-10-18 DIAGNOSIS — Z951 Presence of aortocoronary bypass graft: Secondary | ICD-10-CM

## 2010-10-18 DIAGNOSIS — I2510 Atherosclerotic heart disease of native coronary artery without angina pectoris: Secondary | ICD-10-CM | POA: Diagnosis present

## 2010-10-18 DIAGNOSIS — J4489 Other specified chronic obstructive pulmonary disease: Secondary | ICD-10-CM | POA: Diagnosis present

## 2010-10-18 DIAGNOSIS — J449 Chronic obstructive pulmonary disease, unspecified: Secondary | ICD-10-CM | POA: Diagnosis present

## 2010-10-18 DIAGNOSIS — I951 Orthostatic hypotension: Principal | ICD-10-CM | POA: Diagnosis present

## 2010-10-18 DIAGNOSIS — Z7982 Long term (current) use of aspirin: Secondary | ICD-10-CM

## 2010-10-18 DIAGNOSIS — I10 Essential (primary) hypertension: Secondary | ICD-10-CM | POA: Diagnosis present

## 2010-10-18 DIAGNOSIS — I739 Peripheral vascular disease, unspecified: Secondary | ICD-10-CM | POA: Diagnosis present

## 2010-10-18 DIAGNOSIS — E785 Hyperlipidemia, unspecified: Secondary | ICD-10-CM | POA: Diagnosis present

## 2010-10-18 LAB — TROPONIN I: Troponin I: <0.3 ng/mL (ref <0.30)

## 2010-10-18 LAB — BASIC METABOLIC PANEL
BUN: 22 mg/dL (ref 6–23)
Chloride: 100 mEq/L (ref 96–112)
Glucose, Bld: 121 mg/dL — ABNORMAL HIGH (ref 70–99)
Potassium: 4.4 mEq/L (ref 3.5–5.1)

## 2010-10-18 LAB — BODY FLUID CULTURE: Gram Stain: NONE SEEN

## 2010-10-18 LAB — CK TOTAL AND CKMB (NOT AT ARMC): Relative Index: 3.4 — ABNORMAL HIGH (ref 0.0–2.5)

## 2010-10-18 LAB — DIFFERENTIAL
Eosinophils Relative: 2 % (ref 0–5)
Lymphocytes Relative: 29 % (ref 12–46)
Lymphs Abs: 3.6 10*3/uL (ref 0.7–4.0)

## 2010-10-18 LAB — CBC
HCT: 35.7 % — ABNORMAL LOW (ref 39.0–52.0)
MCV: 85.6 fL (ref 78.0–100.0)
RBC: 4.17 MIL/uL — ABNORMAL LOW (ref 4.22–5.81)
WBC: 12.2 10*3/uL — ABNORMAL HIGH (ref 4.0–10.5)

## 2010-10-18 LAB — D-DIMER, QUANTITATIVE: D-Dimer, Quant: 4.72 ug/mL-FEU — ABNORMAL HIGH (ref 0.00–0.48)

## 2010-10-18 NOTE — Telephone Encounter (Signed)
Called 911 per dr wall patient came to wrong office for appt. When ems arrived patient refused to go to hospitla via ambulance

## 2010-10-19 ENCOUNTER — Encounter (HOSPITAL_COMMUNITY): Payer: Self-pay | Admitting: Radiology

## 2010-10-19 DIAGNOSIS — R55 Syncope and collapse: Secondary | ICD-10-CM

## 2010-10-19 DIAGNOSIS — R42 Dizziness and giddiness: Secondary | ICD-10-CM

## 2010-10-19 LAB — CBC
HCT: 35 % — ABNORMAL LOW (ref 39.0–52.0)
MCHC: 32.3 g/dL (ref 30.0–36.0)
RDW: 15.1 % (ref 11.5–15.5)

## 2010-10-19 LAB — CARDIAC PANEL(CRET KIN+CKTOT+MB+TROPI): Relative Index: INVALID (ref 0.0–2.5)

## 2010-10-19 LAB — COMPREHENSIVE METABOLIC PANEL
ALT: 6 U/L (ref 0–53)
Alkaline Phosphatase: 143 U/L — ABNORMAL HIGH (ref 39–117)
CO2: 31 mEq/L (ref 19–32)
GFR calc non Af Amer: >60 mL/min (ref >60)
Glucose, Bld: 99 mg/dL (ref 70–99)
Potassium: 4.5 mEq/L (ref 3.5–5.1)
Sodium: 136 mEq/L (ref 135–145)
Total Protein: 7 g/dL (ref 6.0–8.3)

## 2010-10-19 LAB — MAGNESIUM: Magnesium: 1.9 mg/dL (ref 1.5–2.5)

## 2010-10-19 LAB — LACTIC ACID, PLASMA: Lactic Acid, Venous: 0.9 mmol/L (ref 0.5–2.2)

## 2010-10-19 LAB — CK TOTAL AND CKMB (NOT AT ARMC): Relative Index: INVALID (ref 0.0–2.5)

## 2010-10-19 MED ORDER — IOHEXOL 300 MG/ML  SOLN
100.0000 mL | Freq: Once | INTRAMUSCULAR | Status: AC | PRN
  Administered 2010-10-18: 100 mL via INTRAVENOUS

## 2010-10-20 LAB — BASIC METABOLIC PANEL
CO2: 29 mEq/L (ref 19–32)
Calcium: 9 mg/dL (ref 8.4–10.5)
Chloride: 106 mEq/L (ref 96–112)
Glucose, Bld: 107 mg/dL — ABNORMAL HIGH (ref 70–99)
Sodium: 139 mEq/L (ref 135–145)
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
## 2010-10-22 LAB — AFB CULTURE WITH SMEAR (NOT AT ARMC)

## 2010-10-24 NOTE — Discharge Summary (Signed)
NAMEBETZALEL, DOHNER            ACCOUNT NO.:  [id number]  MEDICAL RECORD NO.:  [id number]           PATIENT TYPE:  I  LOCATION:  2039                         FACILITY:  [facility]  PHYSICIAN:  Domingo Mend, M.D. DATE OF BIRTH:  [birth year]  DATE OF ADMISSION:  10/18/2010 DATE OF DISCHARGE:  10/20/2010                              DISCHARGE SUMMARY   PRIMARY CARE PHYSICIAN:  Kathryne Eriksson, MD  ONCOLOGIST:  Eilleen Kempf, MD  DISCHARGE DIAGNOSES: 1. Dizziness and shortness of breath, presumed secondary to     orthostatic hypotension, improved. 2. Orthostatic hypotension. 3. History of small-cell lung cancer, status post right-sided partial     lobectomy on August 07, 2010. 4. History of coronary artery disease, status post coronary artery     bypass grafting.. 5. History of chronic obstructive pulmonary disease. 6. Prior history of tobacco abuse, quit 3 years ago. 7. Hyperlipidemia.  DISCHARGE MEDICATIONS: 1. Adderall 30 mg twice daily as needed for attention deficit     disorder. 2. Aspirin 81 mg daily. 3. Atrovent inhaler 2 puffs twice daily. 4. Clonazepam 0.5 mg twice daily as needed for anxiety. 5. Dulera 2 puffs twice daily. 6. Gabapentin 600 mg at bedtime. 7. Guaifenesin 600 mg twice daily. 8. Lipitor 80 mg to take half a tablet at bedtime. 9. Meloxicam 15 mg daily. 10.Metoprolol 25 mg to take half a tablet at bedtime. 11.Opana ER 40 mg every 12 hours. 12.Percocet 10/325 mg every 8 hours as needed for pain. 13.Senokot 2 tablets twice daily for constipation. 14.Trazodone 100 mg at bedtime. 15.Albuterol inhaler 2 puffs every 4 hours as needed for shortness of     breath. 16.Vitamin D3 400 units 1 tablet every morning.  DISPOSITION AND FOLLOWUP:  [name] will be discharged home today in stable and improved condition.  He will follow up with his PCP as previously scheduled or in 2 weeks.  CONSULTATION THIS HOSPITALIZATION:  Dr. Johnsie Cancel and Dr. Radford Pax  with Cardiology.  IMAGES AND PROCEDURES PERFORMED THROUGHOUT THIS HOSPITALIZATION: 1. Chest x-ray on Oct 18, 2010, that showed no acute findings. 2. CT angiogram of the chest on Oct 19, 2010, that showed no evidence     for PE.  A stable right lower lobe lobectomy.  Improvement in     nodular airspace disease of the right lung.  Stable spiculated     lesions in the left upper lobe.  HISTORY AND PHYSICAL:  For complete details, please refer to dictation on Oct 19, 2010, by Dr. Hal Hope, but in brief Mr. Lagrave is a very pleasant 61 year old Caucasian gentleman with a history of small-cell lung cancer, currently undergoing treatment with Dr. [name] as well as coronary artery disease who presented to the hospital with dizziness and shortness of breath, specifically with position changes such as standing up or bending over.  He denies any chest pain.  Denies nausea or vomiting.  Because of this, we are asked to admit him for further evaluation.  HOSPITAL COURSE BY PROBLEM:  Orthostatic hypotension:  We believe at this point that this is the cause for his symptoms.  He has been given aggressive IV  fluid repletion and is feeling much better.  Because of his shortness of breath and history of malignancy, Cardiology decided to perform a CT angiogram to rule out PE which was negative as above.  A 2- D echo has also been ordered, however, results are pending at the time of discharge.  He has been seen in consultation by Dr. Johnsie Cancel who believes no further cardiac workup is necessary at this point and I agree.  He has been instructed to drink up a plenty of fluids and should return to see his primary care physician in 2 weeks or as previously scheduled.  VITAL SIGNS ON DAY OF DISCHARGE:  Blood pressure 109/56, heart rate 67, respirations 18, sats 95% on room air, and temperature 97.5.  Please note that he is not orthostatic on discharge.     Domingo Mend, M.D.     EH/MEDQ  D:   10/20/2010  T:  10/20/2010  Job:  [id number]  cc:   Kathryne Eriksson, MD Eilleen Kempf, M.D. Wallis Bamberg. Johnsie Cancel, MD, [facility] Fransico Him, M.D.  Electronically Signed by Domingo Mend M.D. on 10/24/2010 07:43:53 AM

## 2010-10-25 NOTE — H&P (Signed)
NAMEVERNON, [name]            ACCOUNT NO.:  [id number]  MEDICAL RECORD NO.:  [id number]           PATIENT TYPE:  LOCATION:                                 FACILITY:  PHYSICIAN:  Rise Patience, MDDATE OF BIRTH:  03-01-1951  DATE OF ADMISSION:  10/19/2010 DATE OF DISCHARGE:                             HISTORY & PHYSICAL   PRIMARY CARE PHYSICIAN:  Kathryne Eriksson, MD  PRIMARY ONCOLOGIST:  Eilleen Kempf, MD  CARDIOLOGIST:  Marijo Conception. Wall, MD, [facility]  CHIEF COMPLAINT:  Dizziness and shortness of breath.  HISTORY OF PRESENTING ILLNESS:  A 60 year old male with known history of small-cell lung cancer status post right lower lobe lobectomy on March 14 at [facility] with history of CAD status post CABG 3 years ago, history of COPD, history of cigarette smoking who has had a thoracentesis last week, specific complaints of dizziness whenever he changes position, particularly when he stands up from a sitting position over the last 2-3 days along with increasing shortness of breath.  Denies any chest pain. Denies any nausea, vomiting, fever, chills, cough, or phlegm.  Denies abdominal pain, dysuria, discharge, diarrhea.  In the ER, the patient was initially evaluated by cardiologist Dr. Fransico Him who has started the patient on IV heparin and advised to rule out PE at first and also they are going to do cardiac markers along with 2-D echo.  At this time, CT angio chest is negative for PE.  The patient at this time is admitted for further workup.  PAST MEDICAL HISTORY:  Small cell lung cancer status post right-sided partial lobectomy on August 07, 2010 at [facility], history of CAD status post CABG, history of COPD, history of cigarette smoking, quit 3 years ago, history of hyperlipidemia.  PAST SURGICAL HISTORY:  Right-sided lobectomy for small cell lung cancer on August 07, 2010, history of CABG 3 years ago, history of bilateral shoulder surgery, cervical spine surgery, and low  back surgery.  MEDICATIONS PRIOR TO ADMISSION: 1. Vitamin D3. 2. Ventolin. 3. Trazodone. 4. Senokot. 5. Oxycodone. 6. Acetaminophen 10/325 p.o. q.8 p.r.n. as needed. 7. Opana 40 mg p.o. q.12. 8. Metoprolol XL 0.5 mg at bedtime. 9. Meloxicam 15 mg daily. 10.Lipitor 80 mg half tablet daily at bedtime. 11.Guaifenesin. 12.Gabapentin 600 mg daily at bedtime. 13.Dulera 200/5 mg, 2 puffs twice daily. 14.Clonazepam 0.5 mg twice daily. 15.Atrovent. 16.Aspirin. 17.Amphetamine and dextroamphetamine.  ALLERGIES:  MORPHINE SULFATE.  SOCIAL HISTORY:  The patient quit smoking 15 years ago.  Denies alcohol or drug abuse.  Married, lives with his wife.  He is a full code.  FAMILY HISTORY:  Father had heart troubles.  Sister had cancer.  It was metastatic.  Brother had cancer of his teeth.  REVIEW OF SYSTEMS:  As per history of present illness, nothing else significant.  PHYSICAL EXAMINATION:  GENERAL:  The patient examined at bedside, not in acute distress. VITAL SIGNS:  Blood pressure 102/60, when I examined it was around 94/60, pulse is 78 per minute, temperature 98, respiration 18 per minute, O2 sat 98%. HEENT:  Anicteric.  No pallor.  No discharge from ears, eyes, nose, or  mouth. CHEST:  Bilateral air entry present.  Bilateral bronchial breath sounds more on the right side.  No rhonchi, no crepitation. HEART:  S1, S2 heard. ABDOMEN:  Soft, nontender.  Bowel sounds heard. CNS:  Alert, awake, and oriented to time, place, and person.  Moves upper and lower extremities 5/5. EXTREMITIES:  Peripheral pulses felt.  No edema.  LABS:  EKG shows normal sinus rhythm, heart rate of 57 beats per minute with nonspecific ST-T changes.  Chest x-ray shows no acute findings, stable compared to previous exam.  CT angio chest shows no evidence of pulmonary embolism, stable right lower lobectomy, improvement in nodular airspace disease.  The right lung is stable with spiculated lesion in the left  upper lobe.  Recommend attention on followup.  CBC:  WBC is 12.3, hemoglobin is 11.3, hematocrit is 35.7, platelets 481,000. Hemoglobin last done was 11.5, neutrophils 57%, D-dimer is 4.72.  Basic metabolic panel:  Sodium is 137, potassium 4.4, chloride 100, carbon dioxide 29, glucose 151, BUN 52, creatinine 1.4, calcium 9.4, CK is 105, MB is 3.6, relative index 3.4, troponin I less than 0.3, BNP 269.5.  ASSESSMENT: 1. Dizziness and shortness of breath. 2. History of small-cell lung cancer status post right-sided     lobectomy. 3. History of coronary artery disease status post CABG. 4. Recent admission for pneumonia last month. 5. Stable pleural effusion with recent thoracentesis last week.  PLAN: 1. At this time admit the patient to telemetry. 2. For his dizziness and shortness of breath, the patient does give     symptoms and complaints of orthostatic changes for which we are     going to hydrate the patient with IV fluids. 3. For his shortness of breath, at this time we do not know exactly     what is causing it.  Dr. Fransico Him of Cardiology is planning to     cycle cardiac markers and get a 2-D echo.  At this time, a CT angio     chest is negative for PE.  I did discuss with Dr. Radford Pax who felt     it is okay to discontinue the heparin.  They would be closely     following the patient at this time. I also discussed with the     pulmonologist on call, Dr. Brand Males about the CT angio     chest findings.  At this time as the patient has only leukocytosis     but there is no fever, no tachypnea, no tachycardia, we are not     going to start antibiotics but we are going to check stat lactate     level and procalcitonin level.  If any of them is high, then we     will start empiric antibiotics with respect to the airspace changes     in the lung.  At this time clinically the patient does not have any     pneumonic process. 4. Further recommendation based on test ordered and  clinical course.     Rise Patience, MD     ANK/MEDQ  D:  10/19/2010  T:  10/19/2010  Job:  [id number]  cc:   Kathryne Eriksson, MD Eilleen Kempf, M.D. Thomas C. Verl Blalock, MD, [facility]  Electronically Signed by Gean Birchwood MD on 10/25/2010 07:41:25 AM

## 2010-10-28 ENCOUNTER — Telehealth: Payer: Self-pay | Admitting: Cardiology

## 2010-10-28 NOTE — Telephone Encounter (Signed)
Pt's wife calling re pt having dizzy spells and syncope about 2 weeks ago wants him to see wall but he's only here a couple days then off and booked-pls advise

## 2010-10-29 LAB — BODY FLUID CELL COUNT WITH DIFFERENTIAL
Lymphs, Fluid: 96 %
Monocyte-Macrophage-Serous Fluid: 2 % — ABNORMAL LOW (ref 50–90)
Neutrophil Count, Fluid: 2 % (ref 0–25)
Total Nucleated Cell Count, Fluid: 201 cu mm (ref 0–1000)

## 2010-10-29 LAB — PROTEIN, BODY FLUID: Total protein, fluid: 3.4 g/dL

## 2010-10-29 LAB — LACTATE DEHYDROGENASE, PLEURAL OR PERITONEAL FLUID

## 2010-10-29 NOTE — Telephone Encounter (Signed)
Returning the call from yesterday.

## 2010-10-29 NOTE — Telephone Encounter (Signed)
I talked with pt. Pt hospitalized 10/18/10-10/20/10 for hypotension. Please see DC summary for full details.  He was seen by Dr Johnsie Cancel in the hospital and not further cardiology testing was recommended. Pt was advised to follow-up with PCP. Pt states his symptoms are some better. Pt states he has an appt with Dr Lorelei Pont 10/31/10. Pt will keep the appt with Dr Lorelei Pont 10/31/10 and discuss any further recommendations with him

## 2010-10-30 ENCOUNTER — Encounter: Payer: Self-pay | Admitting: Family Medicine

## 2010-10-30 LAB — BODY FLUID CULTURE
Culture: NO GROWTH
Gram Stain: NONE SEEN

## 2010-10-30 LAB — AFB CULTURE WITH SMEAR (NOT AT ARMC)

## 2010-10-30 LAB — BODY FLUID CELL COUNT WITH DIFFERENTIAL
Eos, Fluid: 0 %
Lymphs, Fluid: 96 %
Monocyte-Macrophage-Serous Fluid: 2 % — ABNORMAL LOW (ref 50–90)
Neutrophil Count, Fluid: 2 % (ref 0–25)
Other Cells, Fluid: 0 %

## 2010-10-30 LAB — LACTATE DEHYDROGENASE, PLEURAL OR PERITONEAL FLUID: LD, Fluid: 219 U/L — ABNORMAL HIGH (ref 3–23)

## 2010-10-30 LAB — PATHOLOGIST SMEAR REVIEW

## 2010-10-30 LAB — FUNGUS CULTURE W SMEAR: Fungal Smear: NONE SEEN

## 2010-10-31 ENCOUNTER — Telehealth: Payer: Self-pay

## 2010-10-31 ENCOUNTER — Ambulatory Visit (INDEPENDENT_AMBULATORY_CARE_PROVIDER_SITE_OTHER): Payer: 59 | Admitting: Family Medicine

## 2010-10-31 ENCOUNTER — Ambulatory Visit (INDEPENDENT_AMBULATORY_CARE_PROVIDER_SITE_OTHER)
Admission: RE | Admit: 2010-10-31 | Discharge: 2010-10-31 | Disposition: A | Discharge status: Home / Self Care | Payer: 59 | Source: Ambulatory Visit | Attending: Family Medicine | Admitting: Family Medicine

## 2010-10-31 ENCOUNTER — Encounter: Payer: Self-pay | Admitting: Family Medicine

## 2010-10-31 VITALS — BP 130/80 | HR 83 | Temp 97.8°F | Wt 156.5 lb

## 2010-10-31 DIAGNOSIS — C3490 Malignant neoplasm of unspecified part of unspecified bronchus or lung: Secondary | ICD-10-CM

## 2010-10-31 DIAGNOSIS — R55 Syncope and collapse: Secondary | ICD-10-CM

## 2010-10-31 MED ORDER — METOPROLOL SUCCINATE ER 25 MG PO TB24: ORAL_TABLET | ORAL | Status: DC

## 2010-10-31 NOTE — Telephone Encounter (Signed)
Message copied by Helene Shoe on Thu Oct 31, 2010  4:11 PM ------      Message from: [name]      Created: Thu Oct 31, 2010  4:07 PM       Call            CXR looks stable and OK

## 2010-10-31 NOTE — Patient Instructions (Signed)
Tea Tree oil - once a day  F/u 3 months
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
## 2010-10-31 NOTE — Telephone Encounter (Signed)
Left v/m on home and cell # for pt to call back. 

## 2010-11-01 NOTE — Telephone Encounter (Signed)
Patient advised.

## 2010-11-04 ENCOUNTER — Encounter: Payer: Self-pay | Admitting: Family Medicine

## 2010-11-11 LAB — FUNGUS CULTURE W SMEAR: Fungal Smear: NONE SEEN

## 2010-11-14 NOTE — Progress Notes (Signed)
Quick Note:  Spoke with patient-aware of results. ______ 

## 2010-11-28 LAB — AFB CULTURE WITH SMEAR (NOT AT ARMC): Acid Fast Smear: NONE SEEN

## 2010-12-10 ENCOUNTER — Other Ambulatory Visit: Payer: Self-pay | Admitting: Internal Medicine

## 2010-12-10 ENCOUNTER — Encounter (HOSPITAL_BASED_OUTPATIENT_CLINIC_OR_DEPARTMENT_OTHER): Payer: 59 | Admitting: Internal Medicine

## 2010-12-10 ENCOUNTER — Ambulatory Visit (HOSPITAL_COMMUNITY)
Admission: RE | Admit: 2010-12-10 | Discharge: 2010-12-10 | Disposition: A | Discharge status: Home / Self Care | Payer: 59 | Source: Ambulatory Visit | Attending: Internal Medicine | Admitting: Internal Medicine

## 2010-12-10 DIAGNOSIS — C3490 Malignant neoplasm of unspecified part of unspecified bronchus or lung: Secondary | ICD-10-CM

## 2010-12-10 DIAGNOSIS — Z9221 Personal history of antineoplastic chemotherapy: Secondary | ICD-10-CM | POA: Insufficient documentation

## 2010-12-10 DIAGNOSIS — R05 Cough: Secondary | ICD-10-CM | POA: Insufficient documentation

## 2010-12-10 DIAGNOSIS — R0602 Shortness of breath: Secondary | ICD-10-CM | POA: Insufficient documentation

## 2010-12-10 DIAGNOSIS — J984 Other disorders of lung: Secondary | ICD-10-CM | POA: Insufficient documentation

## 2010-12-10 DIAGNOSIS — R059 Cough, unspecified: Secondary | ICD-10-CM | POA: Insufficient documentation

## 2010-12-10 LAB — CBC WITH DIFFERENTIAL/PLATELET
BASO%: 0.4 % (ref 0.0–2.0)
EOS%: 3.2 % (ref 0.0–7.0)
HCT: 38.8 % (ref 38.4–49.9)
MCH: 26.8 pg — ABNORMAL LOW (ref 27.2–33.4)
MCHC: 32 g/dL (ref 32.0–36.0)
NEUT%: 61.3 % (ref 39.0–75.0)
RBC: 4.64 10*6/uL (ref 4.20–5.82)
lymph#: 2.8 10*3/uL (ref 0.9–3.3)

## 2010-12-10 LAB — CMP (CANCER CENTER ONLY)
ALT(SGPT): 18 U/L (ref 10–47)
AST: 39 U/L — ABNORMAL HIGH (ref 11–38)
Calcium: 8.9 mg/dL (ref 8.0–10.3)
Chloride: 98 mEq/L (ref 98–108)
Creat: 1.3 mg/dl — ABNORMAL HIGH (ref 0.6–1.2)
Sodium: 135 mEq/L (ref 128–145)
Total Bilirubin: 0.4 mg/dl (ref 0.20–1.60)

## 2010-12-10 MED ORDER — IOHEXOL 300 MG/ML  SOLN
80.0000 mL | Freq: Once | INTRAMUSCULAR | Status: AC | PRN
  Administered 2010-12-10: 80 mL via INTRAVENOUS

## 2010-12-18 ENCOUNTER — Encounter: Payer: Self-pay | Admitting: Internal Medicine

## 2010-12-18 ENCOUNTER — Ambulatory Visit (INDEPENDENT_AMBULATORY_CARE_PROVIDER_SITE_OTHER): Payer: 59 | Admitting: Internal Medicine

## 2010-12-18 DIAGNOSIS — J90 Pleural effusion, not elsewhere classified: Secondary | ICD-10-CM

## 2010-12-18 DIAGNOSIS — J449 Chronic obstructive pulmonary disease, unspecified: Secondary | ICD-10-CM

## 2010-12-18 DIAGNOSIS — J4489 Other specified chronic obstructive pulmonary disease: Secondary | ICD-10-CM

## 2010-12-18 DIAGNOSIS — C3490 Malignant neoplasm of unspecified part of unspecified bronchus or lung: Secondary | ICD-10-CM

## 2010-12-18 NOTE — Assessment & Plan Note (Signed)
Loculated effusion with gas ? Significance. Small possibility of gas-forming infection or residual from thracentesis 2 months ago.  Will discuss at thoracic conference

## 2010-12-18 NOTE — Assessment & Plan Note (Signed)
Will update officew spirometry

## 2010-12-18 NOTE — Patient Instructions (Signed)
Office spirometry- dx COPD

## 2010-12-18 NOTE — Assessment & Plan Note (Signed)
Likely malignant nodule LUL- for Dr Lew Dawes consideration at next visit

## 2010-12-18 NOTE — Progress Notes (Signed)
Subjective:    Patient ID: [name], male    DOB: Feb 01, 1951, 60 y.o.   MRN: [id number]  HPI  62 yoM former heavy smoker. Post hospital consult. S/P RML and RLL lobectomy Feb, 2012 at [facility]. He had one round of chemotherapy in early April, 2012, then admitted [facility] with chills, fever, productive cough, dyspnea and leukocytosis w/ pleural effusion. Thoracentesis and all cultures no growth. Treated empirically with broad antibiotics-Primaxin/Vanc. Gradually improved, but with persistent leukocytosis blamed on hx of splenectomy, Neupogen. Bronchoscopy 09/10/10- nonspecific inflammation. Recent f/u WBC coming down. Patient refuses to consider further chemotherapy.  Now little cough, [name] phlegm, no fever, no blood, some night sweats.  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  CXR 09/19/10 clearing Right pneumonia; stable nodule c/w scar, right effusion vs post op pleural thickening. CXR 2 weeks ago at [facility].   12/18/10- 56 yoM former heavy smoker, squamous cell lung Ca S/P RML and RLL lobectomy Feb, 2012 at [place], hx splenectomy, Rt pleural effusion, LUL lung nodule Wife here Complete chemotherapy Dr Earlie Server.  CT 12/10/10- growing spiculated cavitary nodule LUL, increased gas in moderate right partially loculated pleural effusion-? BP fistula, stable fine reticulonodular opacities- infection vs lymphangitic ca?? Images reviewed with them. Minor cough- thick colorless phlegm, no blood or pain, or fever. No change in routine sweating.  Lab from thoracentesis- 10/14/10- all cultures Neg. Prot 3.4; LDH 219; cytology neg.  Office spirometry 12/18/10- mod restriction. FEV1 22.36/66%; FVC 2.87/ 63%; FEV1/FVC 0.82   Review of Systems  Constitutional:   No weight loss. Some night sweats,  No Fevers, chills, fatigue, lassitude. HEENT:   No headaches,  Difficulty swallowing,  Tooth/dental problems,  Sore throat,                No sneezing, itching, ear ache, nasal congestion, post nasal drip,   CV:  No chest pain,  orthopnea, PND, swelling in lower extremities, anasarca, dizziness, palpitations  GI  No heartburn, indigestion, abdominal pain, nausea, vomiting, diarrhea, change in bowel habits, loss of appetite  Resp: No acute worsening of shortness of breath with exertion or at rest.  No excess mucus, no productive cough,  No non-productive cough,  No coughing up of blood.  No change in color of mucus.  No wheezing.  Skin: no rash or lesions.  GU: no dysuria, change in color of urine, no urgency or frequency.  No flank pain.  MS:  No joint pain or swelling.  No decreased range of motion.  No back pain.  Psych:  No change in mood or affect. No depression or anxiety.  No memory loss.       Objective:   Physical Exam General- Alert, Oriented, Affect-appropriate, Distress- none acute Skin- rash-none, lesions- none, excoriation- none    + tatoos and scars Lymphadenopathy- none Head- atraumatic            Eyes- Gross vision intact, PERRLA, conjunctivae clear secretions            Ears- Hearing, canals            Nose- Clear, Septal dev, mucus, polyps, erosion, perforation             Throat- Mallampati II , mucosa clear , drainage- none, tonsils- atrophic   + dentures Neck- flexible , trachea midline, no stridor , thyroid nl, carotid no bruit Chest - symmetrical excursion , unlabored           Heart/CV- RRR , no murmur , no gallop  ,  no rub, nl s1 s2                           - JVD- none , edema- none, stasis changes- none, varices- none           Lung- clear to P&A, wheeze- none, cough- none , + dull- lower 2/3 right chest, rub- none           Chest wall-  Abd- tender-no, distended-no, bowel sounds-present, HSM- no                       + midline abdominal scar Br/ Gen/ Rectal- Not done, not indicated Extrem- cyanosis- none, clubbing, none, atrophy- none, strength- nl Neuro- grossly intact to observation          Assessment & Plan:

## 2010-12-23 ENCOUNTER — Encounter (HOSPITAL_BASED_OUTPATIENT_CLINIC_OR_DEPARTMENT_OTHER): Payer: 59 | Admitting: Internal Medicine

## 2010-12-23 ENCOUNTER — Other Ambulatory Visit: Payer: Self-pay | Admitting: Internal Medicine

## 2010-12-23 DIAGNOSIS — C3490 Malignant neoplasm of unspecified part of unspecified bronchus or lung: Secondary | ICD-10-CM

## 2010-12-23 DIAGNOSIS — I10 Essential (primary) hypertension: Secondary | ICD-10-CM

## 2010-12-23 DIAGNOSIS — K219 Gastro-esophageal reflux disease without esophagitis: Secondary | ICD-10-CM

## 2010-12-27 ENCOUNTER — Other Ambulatory Visit: Payer: Self-pay | Admitting: Internal Medicine

## 2010-12-27 ENCOUNTER — Encounter (HOSPITAL_COMMUNITY)
Admission: RE | Admit: 2010-12-27 | Discharge: 2010-12-27 | Disposition: A | Discharge status: Home / Self Care | Payer: 59 | Source: Ambulatory Visit | Attending: Internal Medicine | Admitting: Internal Medicine

## 2010-12-27 ENCOUNTER — Ambulatory Visit (INDEPENDENT_AMBULATORY_CARE_PROVIDER_SITE_OTHER): Payer: 59 | Admitting: Family Medicine

## 2010-12-27 ENCOUNTER — Encounter: Payer: Self-pay | Admitting: Family Medicine

## 2010-12-27 DIAGNOSIS — G9349 Other encephalopathy: Secondary | ICD-10-CM | POA: Insufficient documentation

## 2010-12-27 DIAGNOSIS — J984 Other disorders of lung: Secondary | ICD-10-CM | POA: Insufficient documentation

## 2010-12-27 DIAGNOSIS — J441 Chronic obstructive pulmonary disease with (acute) exacerbation: Secondary | ICD-10-CM

## 2010-12-27 DIAGNOSIS — C3490 Malignant neoplasm of unspecified part of unspecified bronchus or lung: Secondary | ICD-10-CM

## 2010-12-27 DIAGNOSIS — F411 Generalized anxiety disorder: Secondary | ICD-10-CM

## 2010-12-27 DIAGNOSIS — F419 Anxiety disorder, unspecified: Secondary | ICD-10-CM | POA: Insufficient documentation

## 2010-12-27 MED ORDER — MOMETASONE FURO-FORMOTEROL FUM 200-5 MCG/ACT IN AERO: 2.0000 | INHALATION_SPRAY | RESPIRATORY_TRACT | Status: DC | PRN

## 2010-12-27 MED ORDER — IPRATROPIUM BROMIDE HFA 17 MCG/ACT IN AERS: 2.0000 | INHALATION_SPRAY | Freq: Two times a day (BID) | RESPIRATORY_TRACT | Status: DC

## 2010-12-27 MED ORDER — AZITHROMYCIN 250 MG PO TABS: ORAL_TABLET | ORAL | Status: AC

## 2010-12-27 MED ORDER — CLONAZEPAM 0.5 MG PO TABS: 0.5000 mg | ORAL_TABLET | Freq: Two times a day (BID) | ORAL | Status: DC | PRN

## 2010-12-27 MED ORDER — IOHEXOL 300 MG/ML  SOLN
100.0000 mL | Freq: Once | INTRAMUSCULAR | Status: AC | PRN
  Administered 2010-12-27: 100 mL via INTRAVENOUS

## 2010-12-27 MED ORDER — FLUDEOXYGLUCOSE F - 18 (FDG) INJECTION
18.3000 | Freq: Once | INTRAVENOUS | Status: AC | PRN
  Administered 2010-12-27: 18.3 via INTRAVENOUS

## 2010-12-27 MED ORDER — PREDNISONE 20 MG PO TABS: ORAL_TABLET | ORAL | Status: DC

## 2010-12-27 NOTE — Patient Instructions (Signed)
Restart Dulera. Use albuterol, and atrovent as needed for wheezing.  Start antibiotic and prednsione taper. Call if shortness of breath not improving as expected or fever on antibioitc.

## 2010-12-27 NOTE — Progress Notes (Signed)
  Subjective:    Patient ID: [name], male    DOB: 1951-03-13, 60 y.o.   MRN: [id number]  Cough This is a new problem. The current episode started 1 to 4 weeks ago. The problem has been gradually worsening. The cough is productive of sputum (HAs changes from white phlegm to yellow). Associated symptoms include chills, a fever, a sore throat, shortness of breath and sweats. Pertinent negatives include no ear congestion, ear pain, headaches, nasal congestion or wheezing. Associated symptoms comments: Sweating a lot, subjective fever. The symptoms are aggravated by nothing. He has tried a beta-agonist inhaler, ipratropium inhaler, steroid inhaler and rest (Using ventolin inhaler routinely, Had stopped dulera for a while, no w has restarted. ) for the symptoms. The treatment provided mild relief. His past medical history is significant for COPD and emphysema. lung cancer... pulmonologist is Dr. Annamaria Boots.      Review of Systems  Constitutional: Positive for fever and chills.  HENT: Positive for sore throat. Negative for ear pain.   Respiratory: Positive for cough and shortness of breath. Negative for wheezing.   Neurological: Negative for headaches.       Objective:   Physical Exam  Constitutional: Vital signs are normal. He appears well-developed and well-nourished.  Non-toxic appearance. He does not appear ill. No distress.  HENT:  Head: Normocephalic and atraumatic.  Right Ear: Hearing, tympanic membrane, external ear and ear canal normal. No tenderness. No foreign bodies. Tympanic membrane is not retracted and not bulging.  Left Ear: Hearing, tympanic membrane, external ear and ear canal normal. No tenderness. No foreign bodies. Tympanic membrane is not retracted and not bulging.  Nose: Nose normal. No mucosal edema or rhinorrhea. Right sinus exhibits no maxillary sinus tenderness and no frontal sinus tenderness. Left sinus exhibits no maxillary sinus tenderness and no frontal sinus  tenderness.  Mouth/Throat: Uvula is midline, oropharynx is clear and moist and mucous membranes are normal. Normal dentition. No dental caries. No oropharyngeal exudate or tonsillar abscesses.  Eyes: Conjunctivae, EOM and lids are normal. Pupils are equal, round, and reactive to light. No foreign bodies found.  Neck: Trachea normal, normal range of motion and phonation normal. Neck supple. Carotid bruit is not present. No mass and no thyromegaly present.  Cardiovascular: Normal rate, regular rhythm, S1 normal, S2 normal, normal heart sounds, intact distal pulses and normal pulses.  Exam reveals no gallop.   No murmur heard. Pulmonary/Chest: Effort normal. No respiratory distress. He has decreased breath sounds in the right upper field, the right middle field, the right lower field, the left upper field, the left middle field and the left lower field. He has no wheezes. He has no rhonchi. He has no rales.  Abdominal: Soft. Normal appearance and bowel sounds are normal. There is no hepatosplenomegaly. There is no tenderness. There is no rebound, no guarding and no CVA tenderness. No hernia.  Neurological: He is alert. He has normal reflexes.  Skin: Skin is warm, dry and intact. No rash noted.  Psychiatric: He has a normal mood and affect. His speech is normal and behavior is normal. Judgment normal.          Assessment & Plan:

## 2010-12-27 NOTE — Assessment & Plan Note (Signed)
Get back on daily controller medications. Start antibiotic and prednisone taper. Follow up if not improving as needed.

## 2010-12-27 NOTE — Assessment & Plan Note (Signed)
Due for refill of clonazepam. Stable.

## 2011-01-01 ENCOUNTER — Other Ambulatory Visit: Payer: Self-pay | Admitting: Internal Medicine

## 2011-01-01 ENCOUNTER — Encounter (HOSPITAL_BASED_OUTPATIENT_CLINIC_OR_DEPARTMENT_OTHER): Payer: 59 | Admitting: Internal Medicine

## 2011-01-01 DIAGNOSIS — C3490 Malignant neoplasm of unspecified part of unspecified bronchus or lung: Secondary | ICD-10-CM

## 2011-01-01 DIAGNOSIS — I10 Essential (primary) hypertension: Secondary | ICD-10-CM

## 2011-01-03 ENCOUNTER — Encounter: Payer: Self-pay | Admitting: Internal Medicine

## 2011-01-06 NOTE — Progress Notes (Signed)
Quick Note:  LMTCB ______ 

## 2011-01-08 ENCOUNTER — Other Ambulatory Visit: Payer: Self-pay | Admitting: *Deleted

## 2011-01-08 MED ORDER — MELOXICAM 15 MG PO TABS: 15.0000 mg | ORAL_TABLET | Freq: Every day | ORAL | Status: DC

## 2011-01-08 NOTE — Telephone Encounter (Signed)
Is this okay to do?

## 2011-01-08 NOTE — Telephone Encounter (Signed)
RX SENT IN AS ADVISED BY DR Lorelei Pont

## 2011-01-08 NOTE — Telephone Encounter (Signed)
Yes, ok to refill #30, 11 refills

## 2011-01-10 ENCOUNTER — Ambulatory Visit: Payer: 59 | Admitting: Internal Medicine

## 2011-01-14 ENCOUNTER — Telehealth: Payer: Self-pay | Admitting: *Deleted

## 2011-01-14 NOTE — Telephone Encounter (Signed)
Heather, please call pt and find out where he wants refill on meloxicam to go.  Original request had come from [facility], you called to [facility] and yesterday request came from [facility].  I made 3 calls regarding this yesterday.  [facility] to [facility] to see if they had gotten a response on their request- they had not, one to [facility] who said refills were called to them and pt picked up refill on 01/08/11, and one to [facility] to advise them we would have to check on this and get back to them.  Dr Lorelei Pont said for me to ask you to check with patient.  If he wants called to [facility], refills will have to be cancelled at [facility].

## 2011-01-15 ENCOUNTER — Encounter: Payer: Self-pay | Admitting: Cardiology

## 2011-01-15 ENCOUNTER — Ambulatory Visit (INDEPENDENT_AMBULATORY_CARE_PROVIDER_SITE_OTHER): Payer: 59 | Admitting: Cardiology

## 2011-01-15 VITALS — BP 103/69 | HR 84 | Ht 70.0 in | Wt 158.0 lb

## 2011-01-15 DIAGNOSIS — I25810 Atherosclerosis of coronary artery bypass graft(s) without angina pectoris: Secondary | ICD-10-CM

## 2011-01-15 MED ORDER — MELOXICAM 15 MG PO TABS: 15.0000 mg | ORAL_TABLET | Freq: Every day | ORAL | Status: DC

## 2011-01-15 NOTE — Assessment & Plan Note (Signed)
Stable. Noncardiac chest pain. Reassurance. Followup in a year.

## 2011-01-15 NOTE — Patient Instructions (Signed)
The current medical regimen is effective;  continue present plan and medications.  Follow up in 1 year with Dr Verl Blalock.  You will receive a letter in the mail 2 months before you are due.  Please call us when you receive this letter to schedule your follow up appointment.
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
## 2011-02-03 ENCOUNTER — Ambulatory Visit: Payer: 59 | Admitting: Family Medicine

## 2011-02-03 DIAGNOSIS — Z0289 Encounter for other administrative examinations: Secondary | ICD-10-CM

## 2011-02-19 LAB — BASIC METABOLIC PANEL
CO2: 29
Calcium: 9.1
Creatinine, Ser: 1.12
Glucose, Bld: 91
Sodium: 135

## 2011-02-19 LAB — APTT: aPTT: 30

## 2011-02-19 LAB — URINALYSIS, ROUTINE W REFLEX MICROSCOPIC
Glucose, UA: NEGATIVE
Hgb urine dipstick: NEGATIVE
Ketones, ur: 15 — AB
Nitrite: NEGATIVE
pH: 5.5

## 2011-02-19 LAB — CBC
HCT: 45.5
MCHC: 33.3
Platelets: 291
RDW: 14.4

## 2011-02-19 LAB — TYPE AND SCREEN: ABO/RH(D): O POS

## 2011-02-24 LAB — CBC
HCT: 49.1
Hemoglobin: 16.1
MCHC: 32.7
MCV: 92.6
RBC: 5.3
RDW: 13.9

## 2011-02-24 LAB — URINALYSIS, ROUTINE W REFLEX MICROSCOPIC
Ketones, ur: 15 — AB
Leukocytes, UA: NEGATIVE
Protein, ur: 30 — AB
Urobilinogen, UA: 1

## 2011-02-24 LAB — COMPREHENSIVE METABOLIC PANEL
BUN: 16
CO2: 30
Calcium: 9.7
Creatinine, Ser: 1.39
GFR calc non Af Amer: 53 — ABNORMAL LOW
Glucose, Bld: 88
Total Protein: 6.6

## 2011-02-24 LAB — URINE MICROSCOPIC-ADD ON

## 2011-02-24 LAB — PROTIME-INR
INR: 0.9
Prothrombin Time: 12.7

## 2011-02-25 LAB — COMPREHENSIVE METABOLIC PANEL
ALT: 12
AST: 23
Alkaline Phosphatase: 85
CO2: 29
Calcium: 10
Chloride: 104
GFR calc Af Amer: >60
GFR calc non Af Amer: 57 — ABNORMAL LOW
Glucose, Bld: 153 — ABNORMAL HIGH
Sodium: 141
Total Bilirubin: 0.8

## 2011-02-25 LAB — URINALYSIS, ROUTINE W REFLEX MICROSCOPIC
Glucose, UA: NEGATIVE
Hgb urine dipstick: NEGATIVE
Ketones, ur: NEGATIVE
Leukocytes, UA: NEGATIVE
Protein, ur: 30 — AB
pH: 6

## 2011-02-25 LAB — URINE MICROSCOPIC-ADD ON

## 2011-02-25 LAB — POTASSIUM: Potassium: 4.6

## 2011-02-25 LAB — CBC
Hemoglobin: 15.9
MCHC: 33.9
RBC: 5.11
WBC: 14.8 — ABNORMAL HIGH

## 2011-02-25 LAB — PROTIME-INR: Prothrombin Time: 13.6

## 2011-03-07 ENCOUNTER — Other Ambulatory Visit: Payer: Self-pay | Admitting: *Deleted

## 2011-03-07 MED ORDER — SILDENAFIL CITRATE 100 MG PO TABS: 100.0000 mg | ORAL_TABLET | Freq: Every day | ORAL | Status: DC | PRN

## 2011-03-11 ENCOUNTER — Other Ambulatory Visit: Payer: Self-pay | Admitting: *Deleted

## 2011-03-11 NOTE — Telephone Encounter (Signed)
Ok to refill with #60, 2 refills

## 2011-03-12 MED ORDER — CLONAZEPAM 0.5 MG PO TABS: 0.5000 mg | ORAL_TABLET | Freq: Two times a day (BID) | ORAL | Status: DC | PRN

## 2011-03-12 NOTE — Telephone Encounter (Signed)
Rx called to pharmacy

## 2011-03-19 ENCOUNTER — Other Ambulatory Visit (INDEPENDENT_AMBULATORY_CARE_PROVIDER_SITE_OTHER): Payer: 59

## 2011-03-19 ENCOUNTER — Ambulatory Visit (INDEPENDENT_AMBULATORY_CARE_PROVIDER_SITE_OTHER): Payer: 59 | Admitting: Internal Medicine

## 2011-03-19 ENCOUNTER — Encounter: Payer: Self-pay | Admitting: Internal Medicine

## 2011-03-19 VITALS — BP 142/82 | HR 87 | Ht 69.0 in | Wt 160.6 lb

## 2011-03-19 DIAGNOSIS — J441 Chronic obstructive pulmonary disease with (acute) exacerbation: Secondary | ICD-10-CM

## 2011-03-19 DIAGNOSIS — J449 Chronic obstructive pulmonary disease, unspecified: Secondary | ICD-10-CM

## 2011-03-19 LAB — BASIC METABOLIC PANEL
Calcium: 9.5 mg/dL (ref 8.4–10.5)
GFR: 48.4 mL/min — ABNORMAL LOW (ref >60.00)
Glucose, Bld: 116 mg/dL — ABNORMAL HIGH (ref 70–99)
Potassium: 5.1 mEq/L (ref 3.5–5.1)
Sodium: 139 mEq/L (ref 135–145)

## 2011-03-19 MED ORDER — AZITHROMYCIN 250 MG PO TABS: ORAL_TABLET | ORAL | Status: AC

## 2011-03-19 MED ORDER — METHOCARBAMOL 500 MG PO TABS: 500.0000 mg | ORAL_TABLET | Freq: Four times a day (QID) | ORAL | Status: DC | PRN

## 2011-03-19 NOTE — Progress Notes (Signed)
Patient ID: [name], male    DOB: 12-17-1950, 60 y.o.   MRN: [id number]  HPI  15 yoM former heavy smoker. Post hospital consult. S/P RML and RLL lobectomy Feb, 2012 at [facility]. He had one round of chemotherapy in early April, 2012, then admitted [facility] with chills, fever, productive cough, dyspnea and leukocytosis w/ pleural effusion. Thoracentesis and all cultures no growth. Treated empirically with broad antibiotics-Primaxin/Vanc. Gradually improved, but with persistent leukocytosis blamed on hx of splenectomy, Neupogen. Bronchoscopy 09/10/10- nonspecific inflammation. Recent f/u WBC coming down. Patient refuses to consider further chemotherapy.  Now little cough, white phlegm, no fever, no blood, some night sweats.  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  CXR 09/19/10 clearing Right pneumonia; stable nodule c/w scar, right effusion vs post op pleural thickening. CXR 2 weeks ago at [facility].   12/18/10- 21 yoM former heavy smoker, squamous cell lung Ca S/P RML and RLL lobectomy Feb, 2012 at [place], hx splenectomy, Rt pleural effusion, LUL lung nodule Wife here Completed chemotherapy Dr Earlie Server.  CT 12/10/10- growing spiculated cavitary nodule LUL, increased gas in moderate right partially loculated pleural effusion-? BP fistula, stable fine reticulonodular opacities- infection vs lymphangitic ca?? Images reviewed with them. Minor cough- thick colorless phlegm, no blood or pain, or fever. No change in routine sweating.  Lab from thoracentesis- 10/14/10- all cultures Neg. Prot 3.4; LDH 219; cytology neg.  Office spirometry 12/18/10- mod restriction. FEV1 22.36/66%; FVC 2.87/ 63%; FEV1/FVC 0.82   03/19/11-  58 yoM former heavy smoker, squamous cell lung Ca S/P RML and RLL lobectomy Feb, 2012 at [facility], hx splenectomy, Rt pleural effusion, LUL lung nodule.Marland KitchenMarland KitchenMarland KitchenMarland Kitchen wife is here For the past week he has had more cough with yellow sputum and some shortness of breath. It may be a cold. No energy. He denies pain in the  chest but has aches in his back and joints. Asks prescription for Robaxin and also for potassium that he was taking for muscle aches. He sees Dr. Mohammed/oncology for followup scan in November.  Review of Systems See HPI Constitutional:   No-   weight loss, night sweats, fevers, chills, fatigue, lassitude. HEENT:   No-  headaches, difficulty swallowing, tooth/dental problems, sore throat,       No-  sneezing, itching, ear ache, nasal congestion, post nasal drip,  CV:  No-   chest pain, orthopnea, PND, swelling in lower extremities, anasarca,  dizziness, palpitations Resp: No-   shortness of breath with exertion or at rest.              +  productive cough,  No non-productive cough,  No- coughing up of blood.              No-   change in color of mucus.  No- wheezing.   Skin: No-   rash or lesions. GI:  No-   heartburn, indigestion, abdominal pain, nausea, vomiting, diarrhea,                 change in bowel habits, loss of appetite GU: No-   dysuria, change in color of urine, no urgency or frequency.  No- flank pain. MS:  +   joint pain or swelling.  No- decreased range of motion.  No- back pain. Neuro-     nothing unusual Psych:  No- change in mood or affect. No depression or anxiety.  No memory loss.  Objective:   Physical Exam General- Alert, Oriented, Affect-appropriate, Distress- none acute Skin- rash-none, lesions- none, excoriation-  none    + tatoos and scars Lymphadenopathy- none Head- atraumatic            Eyes- Gross vision intact, PERRLA, conjunctivae clear secretions            Ears- Hearing, canals            Nose- Clear, Septal dev, mucus, polyps, erosion, perforation             Throat- Mallampati II , mucosa clear , drainage- none, tonsils- atrophic   + dentures Neck- flexible , trachea midline, no stridor , thyroid nl, carotid no bruit Chest - symmetrical excursion , unlabored           Heart/CV- RRR , no murmur , no gallop  , no rub, nl s1 s2                            - JVD- none , edema- none, stasis changes- none, varices- none           Lung- clear to P&A, wheeze- none, cough- none , + dull- lower 2/3 right chest, rub- none           Chest wall-  Abd- tender-no, distended-no, bowel sounds-present, HSM- no                       + midline abdominal scar Br/ Gen/ Rectal- Not done, not indicated Extrem- cyanosis- none, clubbing, none, atrophy- none, strength- nl Neuro- grossly intact to observation

## 2011-03-19 NOTE — Patient Instructions (Addendum)
Try tonic water for aches- 1 glass daily, flavored to taste,e.g. Orange juice or tomato juice   Order- BMET - dx COPD  If potassium and kidney function come back ok, then you can try otc Slow-Mag for combination of magnesium and potassium for aches if needed  Talk with Dr Edilia Bo about cholesterol issues, muscle aches and kidney function  Script Robaxin- in the future you would also get this from Dr Edilia Bo if needed  Flu vax

## 2011-03-22 NOTE — Assessment & Plan Note (Signed)
Acute bronchitis, consistent with a viral respiratory infection. Myalgias or part of a longer-term complaint chest addressed by his memory physician. We discussed quinine/tonic water and asked about his renal function because he was asking for potassium. Plan-Z-Pak to hold. Flu vaccine.

## 2011-03-27 ENCOUNTER — Other Ambulatory Visit (HOSPITAL_COMMUNITY): Payer: 59

## 2011-03-28 NOTE — Progress Notes (Signed)
Quick Note:  LMTCB ______ 

## 2011-03-31 ENCOUNTER — Telehealth: Payer: Self-pay | Admitting: Internal Medicine

## 2011-03-31 ENCOUNTER — Other Ambulatory Visit (HOSPITAL_BASED_OUTPATIENT_CLINIC_OR_DEPARTMENT_OTHER): Payer: 59 | Admitting: Lab

## 2011-03-31 ENCOUNTER — Other Ambulatory Visit: Payer: Self-pay | Admitting: Internal Medicine

## 2011-03-31 ENCOUNTER — Ambulatory Visit (HOSPITAL_COMMUNITY)
Admission: RE | Admit: 2011-03-31 | Discharge: 2011-03-31 | Disposition: A | Discharge status: Home / Self Care | Payer: 59 | Source: Ambulatory Visit | Attending: Internal Medicine | Admitting: Internal Medicine

## 2011-03-31 DIAGNOSIS — C3490 Malignant neoplasm of unspecified part of unspecified bronchus or lung: Secondary | ICD-10-CM | POA: Insufficient documentation

## 2011-03-31 DIAGNOSIS — R222 Localized swelling, mass and lump, trunk: Secondary | ICD-10-CM | POA: Insufficient documentation

## 2011-03-31 DIAGNOSIS — R911 Solitary pulmonary nodule: Secondary | ICD-10-CM | POA: Insufficient documentation

## 2011-03-31 DIAGNOSIS — J90 Pleural effusion, not elsewhere classified: Secondary | ICD-10-CM | POA: Insufficient documentation

## 2011-03-31 LAB — CBC WITH DIFFERENTIAL/PLATELET
BASO%: 1.3 % (ref 0.0–2.0)
Basophils Absolute: 0.2 10*3/uL — ABNORMAL HIGH (ref 0.0–0.1)
EOS%: 2.2 % (ref 0.0–7.0)
HGB: 12.8 g/dL — ABNORMAL LOW (ref 13.0–17.1)
MCH: 26 pg — ABNORMAL LOW (ref 27.2–33.4)
MONO#: 0.9 10*3/uL (ref 0.1–0.9)
RDW: 17.8 % — ABNORMAL HIGH (ref 11.0–14.6)
WBC: 13.1 10*3/uL — ABNORMAL HIGH (ref 4.0–10.3)
lymph#: 5.1 10*3/uL — ABNORMAL HIGH (ref 0.9–3.3)

## 2011-03-31 LAB — CMP (CANCER CENTER ONLY)
ALT(SGPT): 18 U/L (ref 10–47)
AST: 20 U/L (ref 11–38)
Albumin: 3.1 g/dL — ABNORMAL LOW (ref 3.3–5.5)
BUN, Bld: 26 mg/dL — ABNORMAL HIGH (ref 7–22)
CO2: 28 mEq/L (ref 18–33)
Calcium: 9.1 mg/dL (ref 8.0–10.3)
Chloride: 98 mEq/L (ref 98–108)
Potassium: 4.3 mEq/L (ref 3.3–4.7)

## 2011-03-31 MED ORDER — IOHEXOL 300 MG/ML  SOLN
100.0000 mL | Freq: Once | INTRAMUSCULAR | Status: AC | PRN
  Administered 2011-03-31: 60 mL via INTRAVENOUS
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
## 2011-03-31 NOTE — Telephone Encounter (Signed)
Chemistry- kidney function is a little worse and potassium is high. Encourage plenty of water and do not take extra potassium. Discuss this with Dr Edilia Bo [email or web address]

## 2011-04-01 NOTE — Telephone Encounter (Signed)
Pt's wife returned call & can be reached at [phone].  Satira Anis

## 2011-04-01 NOTE — Telephone Encounter (Signed)
Pt's spouse aware of CDY recs on recent lab work and a copy has been forwarded to Dr. Donella Stade per spouse request.

## 2011-04-02 ENCOUNTER — Ambulatory Visit: Payer: 59 | Admitting: Family Medicine

## 2011-04-09 ENCOUNTER — Ambulatory Visit (INDEPENDENT_AMBULATORY_CARE_PROVIDER_SITE_OTHER): Payer: 59 | Admitting: Family Medicine

## 2011-04-09 ENCOUNTER — Encounter: Payer: Self-pay | Admitting: Family Medicine

## 2011-04-09 VITALS — BP 140/92 | HR 60 | Temp 97.8°F | Ht 70.0 in | Wt 163.4 lb

## 2011-04-09 DIAGNOSIS — E7800 Pure hypercholesterolemia, unspecified: Secondary | ICD-10-CM

## 2011-04-09 DIAGNOSIS — N289 Disorder of kidney and ureter, unspecified: Secondary | ICD-10-CM

## 2011-04-09 MED ORDER — ATORVASTATIN CALCIUM 80 MG PO TABS: 80.0000 mg | ORAL_TABLET | Freq: Every day | ORAL | Status: DC

## 2011-04-10 LAB — BASIC METABOLIC PANEL
Calcium: 9.4 mg/dL (ref 8.4–10.5)
Creatinine, Ser: 1.6 mg/dL — ABNORMAL HIGH (ref 0.4–1.5)
GFR: 48.75 mL/min — ABNORMAL LOW (ref >60.00)
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
## 2011-04-22 ENCOUNTER — Other Ambulatory Visit: Payer: Self-pay | Admitting: Internal Medicine

## 2011-04-22 ENCOUNTER — Telehealth: Payer: Self-pay | Admitting: Internal Medicine

## 2011-04-23 NOTE — Telephone Encounter (Signed)
First available

## 2011-04-24 ENCOUNTER — Telehealth: Payer: Self-pay | Admitting: Internal Medicine

## 2011-04-24 NOTE — Telephone Encounter (Signed)
First available

## 2011-04-24 NOTE — Telephone Encounter (Signed)
Talked to pt regarding appt on 12/17th, pt's wife requested sooner appt , pt has not been feeling good.

## 2011-04-24 NOTE — Telephone Encounter (Signed)
Called pt ,left message regarding appt on 1/10

## 2011-05-09 ENCOUNTER — Telehealth: Payer: Self-pay | Admitting: Family Medicine

## 2011-05-09 MED ORDER — ALBUTEROL SULFATE HFA 108 (90 BASE) MCG/ACT IN AERS: 2.0000 | INHALATION_SPRAY | Freq: Four times a day (QID) | RESPIRATORY_TRACT | Status: DC | PRN

## 2011-05-09 NOTE — Telephone Encounter (Signed)
Patient has been trying to get albuterol transferred/filled and [facility] pharmacy has sent 2 faxes and not heard back.  Patient is going out of town and needs today.  At this point, does it need to be called into [facility] or do they need to pick up refills at office.  Please call back to confirm at [phone]

## 2011-05-09 NOTE — Telephone Encounter (Signed)
Rx sent to pharmacy   

## 2011-05-12 ENCOUNTER — Ambulatory Visit: Payer: 59 | Admitting: Internal Medicine

## 2011-05-13 ENCOUNTER — Other Ambulatory Visit: Payer: Self-pay | Admitting: Allergy

## 2011-06-05 ENCOUNTER — Ambulatory Visit (HOSPITAL_BASED_OUTPATIENT_CLINIC_OR_DEPARTMENT_OTHER): Payer: 59 | Admitting: Internal Medicine

## 2011-06-05 VITALS — BP 131/75 | HR 82 | Temp 98.0°F | Ht 70.0 in | Wt 166.6 lb

## 2011-06-05 DIAGNOSIS — C3490 Malignant neoplasm of unspecified part of unspecified bronchus or lung: Secondary | ICD-10-CM
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
## 2011-06-17 ENCOUNTER — Ambulatory Visit (INDEPENDENT_AMBULATORY_CARE_PROVIDER_SITE_OTHER): Payer: 59 | Admitting: Family Medicine

## 2011-06-17 ENCOUNTER — Telehealth: Payer: Self-pay | Admitting: *Deleted

## 2011-06-17 ENCOUNTER — Encounter: Payer: Self-pay | Admitting: Family Medicine

## 2011-06-17 VITALS — BP 126/90 | HR 84 | Temp 98.3°F | Ht 69.5 in | Wt 164.8 lb

## 2011-06-17 DIAGNOSIS — R1013 Epigastric pain: Secondary | ICD-10-CM | POA: Insufficient documentation

## 2011-06-17 LAB — COMPREHENSIVE METABOLIC PANEL
Albumin: 3.8 g/dL (ref 3.5–5.2)
Alkaline Phosphatase: 169 U/L — ABNORMAL HIGH (ref 39–117)
BUN: 35 mg/dL — ABNORMAL HIGH (ref 6–23)
Glucose, Bld: 113 mg/dL — ABNORMAL HIGH (ref 70–99)
Total Bilirubin: 0.2 mg/dL — ABNORMAL LOW (ref 0.3–1.2)

## 2011-06-17 LAB — LIPASE: Lipase: 20 U/L (ref 11.0–59.0)

## 2011-06-17 LAB — CBC WITH DIFFERENTIAL/PLATELET
Basophils Relative: 0.5 % (ref 0.0–3.0)
Eosinophils Relative: 0.6 % (ref 0.0–5.0)
MCV: 82.7 fl (ref 78.0–100.0)
Monocytes Absolute: 1.2 10*3/uL — ABNORMAL HIGH (ref 0.1–1.0)
Monocytes Relative: 6.4 % (ref 3.0–12.0)
Neutrophils Relative %: 62.9 % (ref 43.0–77.0)
RBC: 5.67 Mil/uL (ref 4.22–5.81)
WBC: 19.1 10*3/uL (ref 4.5–10.5)

## 2011-06-17 MED ORDER — METHOCARBAMOL 500 MG PO TABS: 500.0000 mg | ORAL_TABLET | Freq: Four times a day (QID) | ORAL | Status: DC | PRN

## 2011-06-17 MED ORDER — OMEPRAZOLE 40 MG PO CPDR: 40.0000 mg | DELAYED_RELEASE_CAPSULE | Freq: Every day | ORAL | Status: DC

## 2011-06-17 NOTE — Patient Instructions (Signed)
Sounds like possible gastritis or worsening of reflux. Stop mobic (meloxicam). We will check abdominal ultrasound to see aorta as well as blood work today. We will call you with results. Pass by Marion's office to schedule ultrasound. If not improving after 1 week or any worsening, let me know for referral to stomach doctor. If significant worsening, please return or seek care.

## 2011-06-17 NOTE — Progress Notes (Addendum)
  Subjective:    Patient ID: [name], male    DOB: 1951/04/17, 61 y.o.   MRN: [id number]  HPI CC: abd pain, ?HH  61 yo new to me with h/o GERD, HTN, CAD s/p CABG, COPD presents with 2-3 wk h/o epigastric abd pain and LUQ pain worse with movement.  H/o HH in past.  Now leading to vomiting.  Also increased gassiness.  Pain described as sharp stabbing worse when changing positions.  No heartburn, burning sxs.  Emesis food, NBNB.  Seems to be progressively worsening.  No radiation of pain.  No fevers/chills, nausea, diarrhea, constipation, BM changes, blood in stool or urine.  No early satiety, no dysphagia, odynophagia.  No weight loss.  H/o lung cancer s/p bilateral lower lobectomy, squamous cell carcinoma.  Taking zantac, pepcid, prilosec OTC but not helping.  Last BM was this morning, normal  On aspirin 81 mg daily and mobic 15mg  daily.    Significant smoking hx, mother with h/o AAA.  Has not seen GI doc in past.  No recent blood work.  Wt Readings from Last 3 Encounters:  06/17/11 164 lb 12 oz (74.73 kg)  06/05/11 166 lb 9.6 oz (75.569 kg)  04/09/11 163 lb 6.4 oz (74.118 kg)     Review of Systems Per HPI    Objective:   Physical Exam  Nursing note and vitals reviewed. Constitutional: He appears well-developed and well-nourished. No distress.  HENT:  Head: Normocephalic and atraumatic.  Mouth/Throat: Oropharynx is clear and moist. No oropharyngeal exudate.  Eyes: Conjunctivae and EOM are normal. Pupils are equal, round, and reactive to light. No scleral icterus.  Neck: Normal range of motion. Neck supple.  Cardiovascular: Normal rate, regular rhythm, normal heart sounds and intact distal pulses.   No murmur heard. Pulmonary/Chest: Effort normal and breath sounds normal. No respiratory distress. He has no wheezes. He has no rales.  Abdominal: Soft. He exhibits no distension and no mass. Bowel sounds are increased. There is tenderness in the epigastric area and  periumbilical area. There is no rigidity, no rebound, no guarding and negative Murphy's sign. A hernia (umbilical) is present.       No abd bruit  Musculoskeletal: He exhibits no edema.  Lymphadenopathy:    He has no cervical adenopathy.  Skin: Skin is warm and dry. No rash noted.  Psychiatric: He has a normal mood and affect.       Assessment & Plan:

## 2011-06-17 NOTE — Telephone Encounter (Addendum)
spoke with pt.  Reviewed blood work.  Normal pancreas.  Kidneys stable but elevated with cr 1.7.  Liver slightly elevated. Pt endorses pain continued with exertion, no pain when resting. Will set up for CT today or tomorrow. Pt knows if fever >101.5 or worsening pain in meantime to go to ER for evaluation. Encourage fluid intake as well as likely some hemoconcentration.  H pylori positive - await CT scan, if stable, treat for H pylori infection as could be causing some of sxs.

## 2011-06-17 NOTE — Telephone Encounter (Signed)
Tried to call at home, no answer.  Will try later. On exam today no acute abdomen, nontoxic. If not improving will recommend CT.

## 2011-06-17 NOTE — Telephone Encounter (Signed)
To dr.g who saw today

## 2011-06-17 NOTE — Progress Notes (Signed)
Addended by: Ria Bush on: 06/17/2011 11:33 AM   Modules accepted: Orders

## 2011-06-17 NOTE — Telephone Encounter (Signed)
Critical Labs WBC 19.1

## 2011-06-17 NOTE — Assessment & Plan Note (Addendum)
Anticipate worsening GERD or gastritis leading to worsening abd pain.  H/o HH.  Has not had EGD. Check blood work to r/o other etiologies, check [place] given history of smoking and family history of AAA. Less likely umbilical hernia or muscular in origin. Start omeprazole 40mg  daily, stop mobic. If worsening, seek urgent care or return here. If no improvement with above, let me know for referral to GI (?EGD).

## 2011-06-18 ENCOUNTER — Ambulatory Visit (INDEPENDENT_AMBULATORY_CARE_PROVIDER_SITE_OTHER)
Admission: RE | Admit: 2011-06-18 | Discharge: 2011-06-18 | Disposition: A | Discharge status: Home / Self Care | Payer: 59 | Source: Ambulatory Visit | Attending: Family Medicine | Admitting: Family Medicine

## 2011-06-18 ENCOUNTER — Telehealth: Payer: Self-pay | Admitting: Family Medicine

## 2011-06-18 ENCOUNTER — Encounter: Payer: Self-pay | Admitting: Family Medicine

## 2011-06-18 ENCOUNTER — Other Ambulatory Visit: Payer: 59

## 2011-06-18 DIAGNOSIS — R1013 Epigastric pain: Secondary | ICD-10-CM

## 2011-06-18 MED ORDER — AMOXICILL-CLARITHRO-LANSOPRAZ PO MISC: Freq: Two times a day (BID) | ORAL | Status: DC

## 2011-06-18 MED ORDER — IOHEXOL 300 MG/ML  SOLN
70.0000 mL | Freq: Once | INTRAMUSCULAR | Status: AC | PRN
  Administered 2011-06-18: 70 mL via INTRAVENOUS

## 2011-06-18 NOTE — Telephone Encounter (Signed)
Please notify ct scan returned overall ok. Did show esophagus/ hiatal hernia wall thickening, ?esophagitis.  Also showed spleen removed, no abdominal aortic aneurysm identified. I would start with treatment of H pylori and then reassess - if continued after treatment to let us know. Price out prevpack, let us know if too expensive. May stop omeprazole and take prevpack - sent in. Will route to PCP as fyi Have him hold lipitor while on this medicine.

## 2011-06-18 NOTE — Telephone Encounter (Signed)
Patient and wife notified. He will stop omeprazole and take the prevpack. He will call if too expensive. He will hold lipitor while taking the medication. He will call back if no improvement.

## 2011-06-19 ENCOUNTER — Telehealth: Payer: Self-pay | Admitting: *Deleted

## 2011-06-19 ENCOUNTER — Other Ambulatory Visit: Payer: 59

## 2011-06-19 MED ORDER — LANSOPRAZOLE 30 MG PO CPDR: 30.0000 mg | DELAYED_RELEASE_CAPSULE | Freq: Every day | ORAL | Status: DC

## 2011-06-19 MED ORDER — AMOXICILLIN 500 MG PO CAPS: 500.0000 mg | ORAL_CAPSULE | Freq: Two times a day (BID) | ORAL | Status: AC

## 2011-06-19 MED ORDER — CLARITHROMYCIN 500 MG PO TABS: 500.0000 mg | ORAL_TABLET | Freq: Two times a day (BID) | ORAL | Status: AC

## 2011-06-19 NOTE — Telephone Encounter (Signed)
[facility] sent fax requesting separate scripts for Prevpack ingredients. The cost for patient is $250 and they don't typically stock the prevpack, so it would need to be ordered. With scripts for all ingredients they can make him one today.

## 2011-06-19 NOTE — Telephone Encounter (Signed)
Message left with male to have patient return my call.

## 2011-06-19 NOTE — Telephone Encounter (Signed)
Patient notified and will update if no better after completing meds.

## 2011-06-19 NOTE — Telephone Encounter (Signed)
sent in. If not fully better after this, to notify us for referral to GI.

## 2011-06-26 ENCOUNTER — Ambulatory Visit (INDEPENDENT_AMBULATORY_CARE_PROVIDER_SITE_OTHER): Payer: 59 | Admitting: Family Medicine

## 2011-06-26 ENCOUNTER — Telehealth: Payer: Self-pay | Admitting: Family Medicine

## 2011-06-26 ENCOUNTER — Encounter: Payer: Self-pay | Admitting: Family Medicine

## 2011-06-26 DIAGNOSIS — C3490 Malignant neoplasm of unspecified part of unspecified bronchus or lung: Secondary | ICD-10-CM

## 2011-06-26 DIAGNOSIS — J441 Chronic obstructive pulmonary disease with (acute) exacerbation: Secondary | ICD-10-CM

## 2011-06-26 DIAGNOSIS — R042 Hemoptysis: Secondary | ICD-10-CM

## 2011-06-26 MED ORDER — MOMETASONE FURO-FORMOTEROL FUM 200-5 MCG/ACT IN AERO: 2.0000 | INHALATION_SPRAY | RESPIRATORY_TRACT | Status: DC | PRN

## 2011-06-26 MED ORDER — NYSTATIN 100000 UNIT/ML MT SUSP: 500000.0000 [IU] | Freq: Four times a day (QID) | OROMUCOSAL | Status: AC

## 2011-06-26 NOTE — Telephone Encounter (Signed)
Triage Record Num: [id number] Operator: Rosendo Gros DiMatteis Patient Name: Hanh Smelser Call Date & Time: 06/26/2011 12:30:49PM Patient Phone: [phone] PCP: Ria Bush Patient Gender: Male PCP Fax : [phone] Patient DOB: May 05, 1951 Practice Name: [facility] Reason for Call: Caller: Lawayne/Patient; PCP: Ria Bush; CB#: [phone]; Call regarding Breaking Out With Thrush While Taking Antibiotic; he is on antibiotic for H Pylori and they do not have the antibiotic with them to know the name; was started on 125/13; gums have a white coating on it; tongue is red; and mouth is sore; sx started 05/29/11; Triaged per Mouth Lesions Guideline; See in 24 hr d/t white spots on lining of mouth; Appt made for 3:15pm today Dr Lorelei Pont; will comply Protocol(s) Used: Mouth Lesions Recommended Outcome per Protocol: See Provider within 24 hours Reason for Outcome: Painful pale yellow or white spot(s) on the lining of the mouth, gums, or tongue unresponsive to 72 hours of home care or that are unusually large Care Advice: Consider using over-the-counter medication or lozenges to numb the ulcer and relieve the pain as directed on label or by pharmacist. ~ ~ SYMPTOM / CONDITION MANAGEMENT Rinse mouth with a salt solution (1/2 tsp. salt in 8 ounces [.2 liter] of water) or an antiseptic mouthwash without alcohol 3 or more times a day after eating to relieve symptoms. The solution should not be swallowed. ~ 06/26/2011 12:48:01PM Page 1 of 1 [email or web address]
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
## 2011-06-26 NOTE — Patient Instructions (Signed)
REFERRAL: GO THE THE FRONT ROOM AT THE ENTRANCE OF OUR CLINIC, NEAR CHECK IN. ASK FOR MARION. SHE WILL HELP YOU SET UP YOUR REFERRAL. DATE: TIME:  

## 2011-06-27 ENCOUNTER — Ambulatory Visit (INDEPENDENT_AMBULATORY_CARE_PROVIDER_SITE_OTHER)
Admission: RE | Admit: 2011-06-27 | Discharge: 2011-06-27 | Disposition: A | Discharge status: Home / Self Care | Payer: 59 | Source: Ambulatory Visit | Attending: Family Medicine | Admitting: Family Medicine

## 2011-06-27 DIAGNOSIS — C3490 Malignant neoplasm of unspecified part of unspecified bronchus or lung: Secondary | ICD-10-CM

## 2011-06-27 DIAGNOSIS — R042 Hemoptysis: Secondary | ICD-10-CM

## 2011-06-27 MED ORDER — IOHEXOL 300 MG/ML  SOLN
60.0000 mL | Freq: Once | INTRAMUSCULAR | Status: AC | PRN
  Administered 2011-06-27: 60 mL via INTRAVENOUS

## 2011-06-30 ENCOUNTER — Telehealth: Payer: Self-pay | Admitting: Family Medicine

## 2011-06-30 NOTE — Telephone Encounter (Signed)
Patient advised.

## 2011-07-09 ENCOUNTER — Emergency Department (HOSPITAL_COMMUNITY): Payer: 59

## 2011-07-09 ENCOUNTER — Telehealth: Payer: Self-pay | Admitting: Family Medicine

## 2011-07-09 ENCOUNTER — Encounter (HOSPITAL_COMMUNITY): Payer: Self-pay | Admitting: Emergency Medicine

## 2011-07-09 ENCOUNTER — Other Ambulatory Visit: Payer: Self-pay

## 2011-07-09 ENCOUNTER — Inpatient Hospital Stay (HOSPITAL_COMMUNITY)
Admission: EM | Admit: 2011-07-09 | Discharge: 2011-07-10 | DRG: 103 | Disposition: A | Discharge status: Home / Self Care | Payer: 59 | Attending: Internal Medicine | Admitting: Internal Medicine

## 2011-07-09 DIAGNOSIS — B009 Herpesviral infection, unspecified: Secondary | ICD-10-CM

## 2011-07-09 DIAGNOSIS — E7800 Pure hypercholesterolemia, unspecified: Secondary | ICD-10-CM

## 2011-07-09 DIAGNOSIS — Z8249 Family history of ischemic heart disease and other diseases of the circulatory system: Secondary | ICD-10-CM

## 2011-07-09 DIAGNOSIS — I2510 Atherosclerotic heart disease of native coronary artery without angina pectoris: Secondary | ICD-10-CM | POA: Diagnosis present

## 2011-07-09 DIAGNOSIS — I129 Hypertensive chronic kidney disease with stage 1 through stage 4 chronic kidney disease, or unspecified chronic kidney disease: Secondary | ICD-10-CM | POA: Diagnosis present

## 2011-07-09 DIAGNOSIS — I10 Essential (primary) hypertension: Secondary | ICD-10-CM

## 2011-07-09 DIAGNOSIS — C3490 Malignant neoplasm of unspecified part of unspecified bronchus or lung: Secondary | ICD-10-CM

## 2011-07-09 DIAGNOSIS — Z7982 Long term (current) use of aspirin: Secondary | ICD-10-CM

## 2011-07-09 DIAGNOSIS — R5383 Other fatigue: Secondary | ICD-10-CM

## 2011-07-09 DIAGNOSIS — R519 Headache, unspecified: Secondary | ICD-10-CM | POA: Diagnosis present

## 2011-07-09 DIAGNOSIS — I25810 Atherosclerosis of coronary artery bypass graft(s) without angina pectoris: Secondary | ICD-10-CM

## 2011-07-09 DIAGNOSIS — K219 Gastro-esophageal reflux disease without esophagitis: Secondary | ICD-10-CM

## 2011-07-09 DIAGNOSIS — E875 Hyperkalemia: Secondary | ICD-10-CM

## 2011-07-09 DIAGNOSIS — N529 Male erectile dysfunction, unspecified: Secondary | ICD-10-CM

## 2011-07-09 DIAGNOSIS — R5381 Other malaise: Secondary | ICD-10-CM

## 2011-07-09 DIAGNOSIS — F419 Anxiety disorder, unspecified: Secondary | ICD-10-CM

## 2011-07-09 DIAGNOSIS — Z951 Presence of aortocoronary bypass graft: Secondary | ICD-10-CM

## 2011-07-09 DIAGNOSIS — R51 Headache: Principal | ICD-10-CM

## 2011-07-09 DIAGNOSIS — N189 Chronic kidney disease, unspecified: Secondary | ICD-10-CM

## 2011-07-09 DIAGNOSIS — J449 Chronic obstructive pulmonary disease, unspecified: Secondary | ICD-10-CM

## 2011-07-09 DIAGNOSIS — N411 Chronic prostatitis: Secondary | ICD-10-CM

## 2011-07-09 DIAGNOSIS — N184 Chronic kidney disease, stage 4 (severe): Secondary | ICD-10-CM | POA: Diagnosis present

## 2011-07-09 DIAGNOSIS — M129 Arthropathy, unspecified: Secondary | ICD-10-CM

## 2011-07-09 DIAGNOSIS — K759 Inflammatory liver disease, unspecified: Secondary | ICD-10-CM

## 2011-07-09 DIAGNOSIS — J90 Pleural effusion, not elsewhere classified: Secondary | ICD-10-CM

## 2011-07-09 HISTORY — DX: Neuralgia and neuritis, unspecified: M79.2

## 2011-07-09 LAB — COMPREHENSIVE METABOLIC PANEL
ALT: 11 U/L (ref 0–53)
Albumin: 3.3 g/dL — ABNORMAL LOW (ref 3.5–5.2)
Alkaline Phosphatase: 134 U/L — ABNORMAL HIGH (ref 39–117)
Chloride: 99 mEq/L (ref 96–112)
GFR calc Af Amer: 48 mL/min — ABNORMAL LOW (ref >90)
Glucose, Bld: 99 mg/dL (ref 70–99)
Potassium: 5.7 mEq/L — ABNORMAL HIGH (ref 3.5–5.1)
Sodium: 135 mEq/L (ref 135–145)
Total Bilirubin: 0.4 mg/dL (ref 0.3–1.2)
Total Protein: 7.4 g/dL (ref 6.0–8.3)

## 2011-07-09 LAB — CBC
HCT: 46.2 % (ref 39.0–52.0)
MCHC: 32.3 g/dL (ref 30.0–36.0)
MCV: 83.7 fL (ref 78.0–100.0)
Platelets: 366 10*3/uL (ref 150–400)
RDW: 18.3 % — ABNORMAL HIGH (ref 11.5–15.5)

## 2011-07-09 LAB — BASIC METABOLIC PANEL
BUN: 20 mg/dL (ref 6–23)
CO2: 27 mEq/L (ref 19–32)
Chloride: 102 mEq/L (ref 96–112)
GFR calc non Af Amer: 48 mL/min — ABNORMAL LOW (ref >90)
Glucose, Bld: 132 mg/dL — ABNORMAL HIGH (ref 70–99)
Potassium: 4.4 mEq/L (ref 3.5–5.1)
Sodium: 137 mEq/L (ref 135–145)

## 2011-07-09 MED ORDER — DICLOFENAC SODIUM 1 % TD GEL: 1.0000 "application " | Freq: Two times a day (BID) | TRANSDERMAL | Status: DC | PRN

## 2011-07-09 MED ORDER — SODIUM CHLORIDE 0.9 % IV SOLN
INTRAVENOUS | Status: DC
  Administered 2011-07-09: 20:00:00 via INTRAVENOUS

## 2011-07-09 MED ORDER — ASPIRIN EC 81 MG PO TBEC
81.0000 mg | DELAYED_RELEASE_TABLET | Freq: Every day | ORAL | Status: DC
  Administered 2011-07-10: 81 mg via ORAL
  Filled 2011-07-09: qty 1

## 2011-07-09 MED ORDER — OXYCODONE HCL 20 MG PO TB12
60.0000 mg | ORAL_TABLET | Freq: Two times a day (BID) | ORAL | Status: DC
  Administered 2011-07-09 – 2011-07-10 (×2): 60 mg via ORAL
  Filled 2011-07-09 (×2): qty 3

## 2011-07-09 MED ORDER — DM-GUAIFENESIN ER 30-600 MG PO TB12
1.0000 | ORAL_TABLET | Freq: Two times a day (BID) | ORAL | Status: DC
  Administered 2011-07-10 (×2): 1 via ORAL
  Filled 2011-07-09 (×3): qty 1

## 2011-07-09 MED ORDER — ALBUTEROL SULFATE HFA 108 (90 BASE) MCG/ACT IN AERS: 2.0000 | INHALATION_SPRAY | Freq: Four times a day (QID) | RESPIRATORY_TRACT | Status: DC | PRN

## 2011-07-09 MED ORDER — CLONAZEPAM 0.5 MG PO TABS: 0.5000 mg | ORAL_TABLET | Freq: Two times a day (BID) | ORAL | Status: DC | PRN

## 2011-07-09 MED ORDER — ACETAMINOPHEN 650 MG RE SUPP: 650.0000 mg | Freq: Four times a day (QID) | RECTAL | Status: DC | PRN

## 2011-07-09 MED ORDER — ONDANSETRON HCL 4 MG PO TABS: 4.0000 mg | ORAL_TABLET | Freq: Four times a day (QID) | ORAL | Status: DC | PRN

## 2011-07-09 MED ORDER — SODIUM CHLORIDE 0.9 % IV SOLN
INTRAVENOUS | Status: DC
  Administered 2011-07-09: 100 mL via INTRAVENOUS
  Administered 2011-07-09: via INTRAVENOUS

## 2011-07-09 MED ORDER — NITROGLYCERIN 0.4 MG SL SUBL: 0.4000 mg | SUBLINGUAL_TABLET | SUBLINGUAL | Status: DC | PRN

## 2011-07-09 MED ORDER — GABAPENTIN 600 MG PO TABS
600.0000 mg | ORAL_TABLET | Freq: Every day | ORAL | Status: DC
  Administered 2011-07-10: 600 mg via ORAL
  Filled 2011-07-09 (×2): qty 1

## 2011-07-09 MED ORDER — VITAMIN D 1000 UNITS PO TABS
1000.0000 [IU] | ORAL_TABLET | Freq: Every day | ORAL | Status: DC
  Administered 2011-07-10: 1000 [IU] via ORAL
  Filled 2011-07-09: qty 1

## 2011-07-09 MED ORDER — SODIUM POLYSTYRENE SULFONATE 15 GM/60ML PO SUSP
15.0000 g | Freq: Once | ORAL | Status: AC
  Administered 2011-07-09: 15 g via ORAL
  Filled 2011-07-09: qty 60

## 2011-07-09 MED ORDER — SODIUM CHLORIDE 0.9 % IJ SOLN
3.0000 mL | Freq: Two times a day (BID) | INTRAMUSCULAR | Status: DC
  Administered 2011-07-10: 3 mL via INTRAVENOUS

## 2011-07-09 MED ORDER — PANTOPRAZOLE SODIUM 40 MG PO TBEC
40.0000 mg | DELAYED_RELEASE_TABLET | Freq: Every day | ORAL | Status: DC
  Administered 2011-07-10: 40 mg via ORAL
  Filled 2011-07-09: qty 1

## 2011-07-09 MED ORDER — ONDANSETRON HCL 4 MG/2ML IJ SOLN: 4.0000 mg | Freq: Four times a day (QID) | INTRAMUSCULAR | Status: DC | PRN

## 2011-07-09 MED ORDER — ROSUVASTATIN CALCIUM 40 MG PO TABS
40.0000 mg | ORAL_TABLET | Freq: Every day | ORAL | Status: DC
  Administered 2011-07-10: 40 mg via ORAL
  Filled 2011-07-09: qty 1

## 2011-07-09 MED ORDER — GABAPENTIN 300 MG PO CAPS
300.0000 mg | ORAL_CAPSULE | Freq: Every day | ORAL | Status: DC
  Administered 2011-07-10: 300 mg via ORAL
  Filled 2011-07-09: qty 1

## 2011-07-09 MED ORDER — AMPHETAMINE-DEXTROAMPHETAMINE 10 MG PO TABS
30.0000 mg | ORAL_TABLET | ORAL | Status: DC
  Administered 2011-07-10: 30 mg via ORAL
  Filled 2011-07-09: qty 3

## 2011-07-09 MED ORDER — METOPROLOL SUCCINATE 12.5 MG HALF TABLET
12.5000 mg | ORAL_TABLET | Freq: Every day | ORAL | Status: DC
  Administered 2011-07-09: 12.5 mg via ORAL
  Filled 2011-07-09 (×2): qty 1

## 2011-07-09 MED ORDER — METHOCARBAMOL 500 MG PO TABS
500.0000 mg | ORAL_TABLET | Freq: Four times a day (QID) | ORAL | Status: DC | PRN
  Filled 2011-07-09: qty 1

## 2011-07-09 MED ORDER — ONDANSETRON HCL 4 MG/2ML IJ SOLN
4.0000 mg | Freq: Once | INTRAMUSCULAR | Status: AC
  Administered 2011-07-09: 4 mg via INTRAVENOUS
  Filled 2011-07-09: qty 2

## 2011-07-09 MED ORDER — ACETAMINOPHEN 325 MG PO TABS
650.0000 mg | ORAL_TABLET | Freq: Four times a day (QID) | ORAL | Status: DC | PRN
  Administered 2011-07-09: 650 mg via ORAL
  Filled 2011-07-09: qty 2

## 2011-07-09 MED ORDER — MOMETASONE FURO-FORMOTEROL FUM 100-5 MCG/ACT IN AERO: 2.0000 | INHALATION_SPRAY | Freq: Two times a day (BID) | RESPIRATORY_TRACT | Status: DC | PRN

## 2011-07-09 MED ORDER — FENTANYL CITRATE 0.05 MG/ML IJ SOLN
100.0000 ug | Freq: Once | INTRAMUSCULAR | Status: AC
  Administered 2011-07-09: 100 ug via INTRAVENOUS
  Filled 2011-07-09: qty 2

## 2011-07-09 MED ORDER — MELOXICAM 15 MG PO TABS
15.0000 mg | ORAL_TABLET | Freq: Every day | ORAL | Status: DC
  Administered 2011-07-10: 15 mg via ORAL
  Filled 2011-07-09 (×2): qty 1

## 2011-07-09 MED ORDER — SENNA 8.6 MG PO TABS
2.0000 | ORAL_TABLET | Freq: Every evening | ORAL | Status: DC | PRN
  Filled 2011-07-09: qty 2

## 2011-07-09 NOTE — Telephone Encounter (Signed)
Triage Record Num: [id number] Operator: Rosendo Gros DiMatteis Patient Name: [name] Call Date & Time: 07/09/2011 9:29:21AM Patient Phone: [phone] PCP: Ria Bush Patient Gender: Male PCP Fax : [phone] Patient DOB: 10-27-1950 Practice Name: [facility] Reason for Call: Caller: [name]/Spouse; PCP: Ria Bush; CB#: [phone]; Call regarding H/A and Swelling On Left Side of Head (size of Quarter); denies any injury; sx started 07/06/11; right foot and leg is twitching when he is in bed; short term memory not as good as use to be; worst headache he has ever had; Triaged per Headache Guideline; See in ED immed d/t worst h/a of his life; Dr Lillia Corporal made aware and instructed ED over [place]; wife made aware and will comply Protocol(s) Used: Headache Recommended Outcome per Protocol: Activate EMS 911 Reason for Outcome: Sudden, severe disabling head pain OR caller spontaneously verbalizes "worst headache of my life" Care Advice: ~ An adult should stay with the patient, preferably one trained in CPR. ~ IMMEDIATE ACTION 07/09/2011 9:46:09AM Page 1 of 1 [email or web address]
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
## 2011-07-09 NOTE — ED Notes (Signed)
H/a x 3 days  Hx of lung ca 1 year ago  No n/v

## 2011-07-09 NOTE — Telephone Encounter (Signed)
To: [facility] (Daytime Triage) Fax: [phone] From: [facility] Date/ Time: 07/09/2011 9:43 AM Taken By: Corinda Gubler, RN Caller: [name] Facility: not collected Patient: [name], [name] DOB: [birth year] Phone: [phone] Reason for Call: Pt sent to ED d/t worst headache of his life and having new twitching of lower extremity with short term memory loss per wife Regarding Appointment: Appt Date: Appt Time: Unknown Provider: Reason: Details: Outcome:
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
## 2011-07-09 NOTE — ED Notes (Signed)
Report received from [place]. Currently pt is not in any respiratory distress. No neurological deficits. No pain. Will continue to monitor.

## 2011-07-09 NOTE — ED Notes (Signed)
Pt c/o left temporal headache x's 3 days unrelieved with Percocet and OTC meds.  Pt st's sore to touch, painful to turn head.  Pt has no weakness, is alert and oriented x's 3.  St's feels like pressure behind left eye.  Wife at bedside.

## 2011-07-10 LAB — BASIC METABOLIC PANEL
BUN: 20 mg/dL (ref 6–23)
CO2: 27 mEq/L (ref 19–32)
CO2: 27 mEq/L (ref 19–32)
Calcium: 9.3 mg/dL (ref 8.4–10.5)
Chloride: 101 mEq/L (ref 96–112)
Chloride: 103 mEq/L (ref 96–112)
GFR calc Af Amer: 61 mL/min — ABNORMAL LOW (ref >90)
Glucose, Bld: 99 mg/dL (ref 70–99)
Glucose, Bld: 139 mg/dL — ABNORMAL HIGH (ref 70–99)
Sodium: 137 mEq/L (ref 135–145)

## 2011-07-10 LAB — CBC
HCT: 46.9 % (ref 39.0–52.0)
Hemoglobin: 15.1 g/dL (ref 13.0–17.0)
MCHC: 32.2 g/dL (ref 30.0–36.0)
MCV: 84.7 fL (ref 78.0–100.0)

## 2011-07-10 NOTE — Discharge Summary (Signed)
Physician Discharge Summary  Patient ID: [name] MRN: [id number] DOB/AGE: April 25, 1951 61 y.o.  Admit date: 07/09/2011 Discharge date: 07/10/2011    Discharge Diagnoses:  1. Headache, resolved 2. Hyperkalemia, resolved 3. Htn, controlled 4. CKD 5. Hx SCC of lung s/p lobectomy 06/2010 6. COPD 7. Hx CAD 8. Hypercholesterolemia 9. GERD 10. Hx aflutter s/p ablation 11. Chronic pain   Brief Summary: 61 years old white male with history of stage II a non-small cell lung cancer, squamous cell carcinoma diagnosed in November of 2011, status post right middle and lower bilobectomy and followed by 1 cycle of adjuvant chemotherapy, hypertension, COPD, atrial flutter status post ablation, coronary artery disease status post bypass, chronic pain on opioids presents with pain in front of his left ear. He was in his usual state of health until approximately 3 days ago when he noticed pain at a specific spot in front of the left ear which overlies prior craniotomy scar. He is unable to clarify whether this started abruptly or was gradual in onset. It is sharp and intermittent. It can be 10/10 in severity especially with activity such as touching the spot or moving his head and sometimes chewing motion of his mouth. He has no pain when he is sitting or lying still. There is no associated visual disturbances, earache, sore throat, fevers or chills or diffuse headaches.   Hospital Course by Discharge Summary 1. Headache: unclear etiology. Pt describes pain as intermittent, sharp and associated with movement. CT head is unremarkable. Temporal arteritis ruled out with normal ESR. No evidence of infectious etiology as pt is afebrile with normal WBC count. Pt's headache is now resolved. Other differential would include trigeminal neuralgia. Pt is instructed to follow-up with PCP in 2 weeks. Would consider MRA brain if pain persists. No further inpatient work-up felt necessary at this time.  2.  Hyperkalemia: resolved with IVF's, Kayexalate.   3. Hx htn: remained stable throughout hospitalization. Continue beta blocker as prior to admission.  Consults: NONE   Significant Diagnostic Studies:   CT HEAD WITHOUT CONTRAST  Technique: Contiguous axial images were obtained from the base of  the skull through the vertex without contrast.  Comparison: 12/27/2010  Findings: Prior left temporal craniectomy with the area of  encephalomalacia in the left temporal lobe again noted, unchanged.  No acute intracranial abnormality. Specifically, no hemorrhage,  hydrocephalus, mass lesion, acute infarction, or significant  intracranial injury. No acute calvarial abnormality. Visualized  paranasal sinuses and mastoids clear. Orbital soft tissues  unremarkable.  IMPRESSION:  Stable area of encephalomalacia within the left temporal lobe. No  acute intracranial abnormality.  Original Report Authenticated By: Raelyn Number, M.D.   Discharge Exam: General: Awake, alert in NAD HEENT: perrla, EOMI, mild tenderness on palpation of left temporal area. Normal ROM in jaw without any crepitus or popping CV: S1S2 RRR, no m/r/g Resp: CTAB, no w/r/c, no increased wob GI: abdomen soft, NT/ND, BS+ Neuro: no focal M/S deficits on exam Psych: AOx4, pleasant affect   Discharge Labs  BMET  Lab 07/10/11 0515 07/09/11 2252 07/09/11 1637  NA 137 137 135  K PENDING 4.4 --  CL 101 102 99  CO2 27 27 30   GLUCOSE 99 132* 99  BUN 20 20 21   CREATININE 1.42* 1.52* 1.72*  CALCIUM 9.4 9.1 9.7  MG -- -- --  PHOS -- -- --     CBC   Lab 07/10/11 0515 07/09/11 1637  HGB 15.1 14.9  HCT 46.9 46.2  WBC  12.0* 12.1*  PLT 386 366   Anti-Coagulation No results found for this basename: INR:5 in the last 168 hours    Discharge Orders    Future Appointments: Provider: Department: Dept Phone: Center:   09/04/2011 9:15 AM [facility] [phone] None   09/04/2011 10:00 AM [facility]  2 [facility] VO:[id number] [place]   09/08/2011 3:30 PM Mohamed K. [name], MD [facility] [phone] None   09/17/2011 11:00 AM Deneise Lever, MD [facility] [phone] None       Follow-up Information    Follow up with [name], MD. Schedule an appointment as soon as possible for a visit in 2 weeks.           [place], [place]  Home Medication Instructions [id number]   Printed on:07/10/11 0855  Medication Information                    amphetamine-dextroamphetamine (ADDERALL, 30MG ,) 30 MG tablet Take 30 mg by mouth 2 (two) times daily.             aspirin 81 MG tablet Take 81 mg by mouth daily.             gabapentin (NEURONTIN) 300 MG capsule Take 300-600 mg by mouth 2 (two) times daily. 1 tab am  And 2 tabs pm           dextromethorphan-guaiFENesin (MUCINEX DM) 30-600 MG per 12 hr tablet Take 1 tablet by mouth every 12 (twelve) hours.             nitroGLYCERIN (NITROSTAT) 0.4 MG SL tablet Place 0.4 mg under the tongue every 5 (five) minutes as needed. For chest pain           Cholecalciferol (VITAMIN D) 1000 UNITS capsule Take 1,000 Units by mouth daily.             diclofenac sodium (VOLTAREN) 1 % GEL Apply 1 application topically 2 (two) times daily as needed. For pain           cetirizine (ZYRTEC) 10 MG tablet Take 10 mg by mouth daily as needed. For allergies           albuterol (PROVENTIL) (2.5 MG/3ML) 0.083% nebulizer solution Take 2.5 mg by nebulization every 6 (six) hours as needed.             meloxicam (MOBIC) 15 MG tablet Take 1 tablet (15 mg total) by mouth daily.           Oxycodone HCl (OXYCONTIN) 60 MG TB12 Take 1 tablet by mouth 2 (two) times daily.            albuterol (VENTOLIN HFA) 108 (90 BASE) MCG/ACT inhaler Inhale 2 puffs into the lungs every 6 (six) hours as needed.           methocarbamol (ROBAXIN) 500 MG tablet Take 1 tablet (500 mg total) by mouth every 6 (six) hours as needed (muscle ache and stiffness).            lansoprazole (PREVACID) 30 MG capsule Take 1 capsule (30 mg total) by mouth daily.           atorvastatin (LIPITOR) 80 MG tablet Take 80 mg by mouth at bedtime.           clonazePAM (KLONOPIN) 0.5 MG tablet Take 0.5 mg by mouth 2 (two) times daily as needed. For anxiety           metoprolol  succinate (TOPROL-XL) 25 MG 24 hr tablet Take 12.5 mg by mouth at bedtime.           mometasone-formoterol (DULERA) 100-5 MCG/ACT AERO Inhale 2 puffs into the lungs 2 (two) times daily as needed. For wheezing           sildenafil (VIAGRA) 100 MG tablet Take 100 mg by mouth daily as needed. For erectile dysfunction             Disposition: Home  Discharged Condition: [name] has met maximum benefit of inpatient care and is medically stable and cleared for discharge.  Patient is pending follow up as above.      Time spent on disposition: 30 minutes.   Patrici Ranks, NP-C [facility] [place]  pgr [phone]

## 2011-07-10 NOTE — Progress Notes (Signed)
[name] to be D/C'd Home per MD order.  Discussed with the patient and all questions fully answered.   [name], [name]  Home Medication Instructions [id number]   Printed on:07/10/11 1146  Medication Information                    amphetamine-dextroamphetamine (ADDERALL, 30MG ,) 30 MG tablet Take 30 mg by mouth 2 (two) times daily.             aspirin 81 MG tablet Take 81 mg by mouth daily.             gabapentin (NEURONTIN) 300 MG capsule Take 300-600 mg by mouth 2 (two) times daily. 1 tab am  And 2 tabs pm           dextromethorphan-guaiFENesin (MUCINEX DM) 30-600 MG per 12 hr tablet Take 1 tablet by mouth every 12 (twelve) hours.             nitroGLYCERIN (NITROSTAT) 0.4 MG SL tablet Place 0.4 mg under the tongue every 5 (five) minutes as needed. For chest pain           Cholecalciferol (VITAMIN D) 1000 UNITS capsule Take 1,000 Units by mouth daily.             diclofenac sodium (VOLTAREN) 1 % GEL Apply 1 application topically 2 (two) times daily as needed. For pain           cetirizine (ZYRTEC) 10 MG tablet Take 10 mg by mouth daily as needed. For allergies           albuterol (PROVENTIL) (2.5 MG/3ML) 0.083% nebulizer solution Take 2.5 mg by nebulization every 6 (six) hours as needed.             meloxicam (MOBIC) 15 MG tablet Take 1 tablet (15 mg total) by mouth daily.           Oxycodone HCl (OXYCONTIN) 60 MG TB12 Take 1 tablet by mouth 2 (two) times daily.            albuterol (VENTOLIN HFA) 108 (90 BASE) MCG/ACT inhaler Inhale 2 puffs into the lungs every 6 (six) hours as needed.           methocarbamol (ROBAXIN) 500 MG tablet Take 1 tablet (500 mg total) by mouth every 6 (six) hours as needed (muscle ache and stiffness).           lansoprazole (PREVACID) 30 MG capsule Take 1 capsule (30 mg total) by mouth daily.           atorvastatin (LIPITOR) 80 MG tablet Take 80 mg by mouth at bedtime.           clonazePAM (KLONOPIN) 0.5 MG tablet Take  0.5 mg by mouth 2 (two) times daily as needed. For anxiety           metoprolol succinate (TOPROL-XL) 25 MG 24 hr tablet Take 12.5 mg by mouth at bedtime.           mometasone-formoterol (DULERA) 100-5 MCG/ACT AERO Inhale 2 puffs into the lungs 2 (two) times daily as needed. For wheezing           sildenafil (VIAGRA) 100 MG tablet Take 100 mg by mouth daily as needed. For erectile dysfunction             VVS, Skin clean, dry and intact without evidence of skin break down, no evidence of skin tears noted. IV catheter discontinued intact. Site without  signs and symptoms of complications. Dressing and pressure applied.  An After Visit Summary was printed and given to the patient. Patient escorted via [facility], and D/C home via private auto.  Tessa Lerner 07/10/2011 11:46 AM

## 2011-07-10 NOTE — Discharge Summary (Signed)
Agree with discharge note as outlined by Patrici Ranks, NP. Plan to DC home today. Followup with PCP as scheduled.

## 2011-07-23 ENCOUNTER — Encounter: Payer: Self-pay | Admitting: Family Medicine

## 2011-07-23 ENCOUNTER — Ambulatory Visit (INDEPENDENT_AMBULATORY_CARE_PROVIDER_SITE_OTHER): Payer: 59 | Admitting: Family Medicine

## 2011-07-23 VITALS — BP 140/90 | HR 79 | Temp 98.6°F | Ht 71.5 in | Wt 166.1 lb

## 2011-07-23 DIAGNOSIS — I10 Essential (primary) hypertension: Secondary | ICD-10-CM

## 2011-07-23 MED ORDER — METOPROLOL SUCCINATE ER 50 MG PO TB24: 50.0000 mg | ORAL_TABLET | Freq: Two times a day (BID) | ORAL | Status: DC

## 2011-07-23 NOTE — Patient Instructions (Signed)
Metoprolol 50 mg in morning, then take 25 mg at night. Do that for a week.  If BP still > 140/90, then take 50 mg in morning, and 50 mg at night.

## 2011-07-23 NOTE — Progress Notes (Signed)
  Patient Name: [name] Date of Birth: 10-23-1950 Age: 61 y.o. Medical Record Number: [id number] Gender: male Date of Encounter: 07/23/2011  History of Present Illness:  [name] is a 61 y.o. very pleasant male patient who presents with the following:  Metoprolol - 1/2 in AM and 1/2 at night  The patient went to the emergency room last week with a 10/10 headache last week. He was admitted, no abnormality was found, and ultimately his headache resolved within a couple of days, and is completely gone.  Hypertension: Stable, elevated today, he brings in reports were as elevated to anywhere from 150-170/80 over 80's-90's Increased his meds to bid at home  Dg Chest 2 View  07/09/2011  *RADIOLOGY REPORT*  Clinical Data: Lung cancer.  Cough  CHEST - 2 VIEW  Comparison: CT 06/27/2011, chest x-ray 10/31/2010  Findings: Prior median sternotomy.  Right lower lobectomy with volume loss.  Pleural thickening in the right lung base is stable. There is also scarring in the right apex which is stable.  Negative for heart failure or pneumonia.  Cavitary lesion in the left upper lobe was present on the prior CT and appears similar on the chest x-ray.  This may be neoplastic or inflammatory.  This could best followed by CT for interval change.  IMPRESSION: COPD with scarring.  Left upper lobe cavitary lesion measuring approximately 15 mm is unchanged. This shows low-level FDG uptake on PET scan of 12/27/2010.  Original Report Authenticated By: Truett Perna, M.D.   Ct Head Wo Contrast  07/09/2011  *RADIOLOGY REPORT*  Clinical Data: Left sided headache.  CT HEAD WITHOUT CONTRAST  Technique:  Contiguous axial images were obtained from the base of the skull through the vertex without contrast.  Comparison: 12/27/2010  Findings: Prior left temporal craniectomy with the area of encephalomalacia in the left temporal lobe again noted, unchanged. No acute intracranial abnormality.  Specifically, no  hemorrhage, hydrocephalus, mass lesion, acute infarction, or significant intracranial injury.  No acute calvarial abnormality. Visualized paranasal sinuses and mastoids clear.  Orbital soft tissues unremarkable.  IMPRESSION: Stable area of encephalomalacia within the left temporal lobe. No acute intracranial abnormality.  Original Report Authenticated By: Raelyn Number, M.D.     Past Medical History, Surgical History, Social History, Family History, Problem List, Medications, and Allergies have been reviewed and updated if relevant.  Review of Systems: No cp, no sob, no ha  Physical Examination: Filed Vitals:   07/23/11 1206  BP: 140/90  Pulse: 79  Temp: 98.6 F (37 C)  TempSrc: Oral  Height: 5' 11.5" (1.816 m)  Weight: 166 lb 1.9 oz (75.352 kg)  SpO2: 99%    Body mass index is 22.85 kg/(m^2).   GEN: WDWN, NAD, Non-toxic, A & O x 3 HEENT: Atraumatic, Normocephalic. Neck supple. No masses, No LAD. Ears and Nose: No external deformity. CV: RRR, No M/G/R. No JVD. No thrill. No extra heart sounds. EXTR: No c/c/e NEURO Normal gait.  PSYCH: Normally interactive. Conversant. Not depressed or anxious appearing.  Calm demeanor.    Assessment and Plan: 1. Hypertension  metoprolol succinate (TOPROL-XL) 50 MG 24 hr tablet  2. HYPERTENSION      incr as her pi

## 2011-07-30 ENCOUNTER — Encounter: Payer: Self-pay | Admitting: Cardiology

## 2011-07-30 ENCOUNTER — Ambulatory Visit (INDEPENDENT_AMBULATORY_CARE_PROVIDER_SITE_OTHER): Payer: 59 | Admitting: Cardiology

## 2011-07-30 DIAGNOSIS — I739 Peripheral vascular disease, unspecified: Secondary | ICD-10-CM | POA: Insufficient documentation

## 2011-07-30 DIAGNOSIS — I2510 Atherosclerotic heart disease of native coronary artery without angina pectoris: Secondary | ICD-10-CM | POA: Insufficient documentation

## 2011-07-30 DIAGNOSIS — E7800 Pure hypercholesterolemia, unspecified: Secondary | ICD-10-CM

## 2011-07-30 DIAGNOSIS — I10 Essential (primary) hypertension: Secondary | ICD-10-CM

## 2011-07-30 DIAGNOSIS — I25810 Atherosclerosis of coronary artery bypass graft(s) without angina pectoris: Secondary | ICD-10-CM

## 2011-07-30 NOTE — Patient Instructions (Signed)
Your physician recommends that you continue on your current medications as directed. Please refer to the Current Medication list given to you today.  Your physician wants you to follow-up in: 1 year with Dr. Verl Blalock. You will receive a reminder letter in the mail two months in advance. If you don't receive a letter, please call our office to schedule the follow-up appointment.  Follow-up with your primary care doctor regularly about your blood pressure

## 2011-07-30 NOTE — Assessment & Plan Note (Signed)
Stable. No change in medical therapy. 

## 2011-07-30 NOTE — Progress Notes (Signed)
HPI [name] returns for evaluation and management of his coronary disease. He also has peripheral vascular disease and has had stents placed in his right lower extremity.  Other than his blood pressure not being well controlled, he's got no complaints today. Specifically, he has no angina or ischemic symptoms. Continues not to smoke. He  He seems very compliant with his meds. Dr. Edilia [name] just increased his Toprol to 50 mg twice a day. This was for blood pressure control. I note he also takes Adderall at least once a day.  Past Medical History  Diagnosis Date  . Atrial flutter     s/p ablation by Dr. Lovena [name]  . Coronary artery disease     artery bypass graft 2008  . Hypercholesterolemia   . Hypertension   . COPD (chronic obstructive pulmonary disease)   . Hepatitis, unspecified   . GERD (gastroesophageal reflux disease)   . Arthritis   . Lung cancer     right, NSC, s/p bilobectomy-R, [facility] 06/2010, Squamous Cell  . Neuropathic pain     Current Outpatient Prescriptions  Medication Sig Dispense Refill  . albuterol (PROVENTIL) (2.5 MG/3ML) 0.083% nebulizer solution Take 2.5 mg by nebulization every 6 (six) hours as needed.        [name] albuterol (VENTOLIN HFA) 108 (90 BASE) MCG/ACT inhaler Inhale 2 puffs into the lungs every 6 (six) hours as needed.  18 g  3  . amphetamine-dextroamphetamine (ADDERALL, 30MG ,) 30 MG tablet Take 30 mg by mouth 2 (two) times daily.        [name] aspirin 81 MG tablet Take 81 mg by mouth daily.        [name] atorvastatin (LIPITOR) 80 MG tablet Take 80 mg by mouth at bedtime.      . cetirizine (ZYRTEC) 10 MG tablet Take 10 mg by mouth daily as needed. For allergies      . Cholecalciferol (VITAMIN D) 1000 UNITS capsule Take 1,000 Units by mouth daily.        . clonazePAM (KLONOPIN) 0.5 MG tablet Take 0.5 mg by mouth 2 (two) times daily as needed. For anxiety      . dextromethorphan-guaiFENesin (MUCINEX DM) 30-600 MG per 12 hr tablet Take 1 tablet by mouth every 12  (twelve) hours.        . diclofenac sodium (VOLTAREN) 1 % GEL Apply 1 application topically 2 (two) times daily as needed. For pain      . gabapentin (NEURONTIN) 300 MG capsule Take 300-600 mg by mouth 2 (two) times daily. 1 tab am  And 2 tabs pm      . lansoprazole (PREVACID) 30 MG capsule Take 1 capsule (30 mg total) by mouth daily.  28 capsule  0  . meloxicam (MOBIC) 15 MG tablet Take 1 tablet (15 mg total) by mouth daily.  90 tablet  3  . methocarbamol (ROBAXIN) 500 MG tablet Take 1 tablet (500 mg total) by mouth every 6 (six) hours as needed (muscle ache and stiffness).  50 tablet  1  . metoprolol succinate (TOPROL-XL) 50 MG 24 hr tablet Take 1 tablet (50 mg total) by mouth 2 (two) times daily.  180 tablet  3  . mometasone-formoterol (DULERA) 100-5 MCG/ACT AERO Inhale 2 puffs into the lungs 2 (two) times daily as needed. For wheezing      . nitroGLYCERIN (NITROSTAT) 0.4 MG SL tablet Place 0.4 mg under the tongue every 5 (five) minutes as needed. For chest pain      . Oxycodone  HCl (OXYCONTIN) 60 MG TB12 Take 1 tablet by mouth 2 (two) times daily.       . sildenafil (VIAGRA) 100 MG tablet Take 100 mg by mouth daily as needed. For erectile dysfunction        Allergies  Allergen Reactions  . Morphine And Related     Family History  Problem Relation Age of Onset  . Arthritis      family hx of  . Diabetes      1st degree relative  . Hypertension      family hx of  . Lung cancer      family hx of  . Cancer      Family History of Lung Cancer  . Stroke [name] 73  . Heart disease [name]     CAD  . Stroke [name] 76  . Heart disease [name]     CAD  . Heart disease      Family Hx of cardiovascular disorder  . Stroke [name]     multiple  . Coronary artery disease      parents/family hx of cardiovascular disorder    History   Social History  . Marital Status: Married    Spouse Name: [name]    Number of Children: [name]  . Years of Education: [name]   Occupational History  .  disabled    Social History Main Topics  . Smoking status: Former Smoker -- 2.0 packs/day for 45 years    Types: Cigarettes    Quit date: 01/24/2009  . Smokeless tobacco: Not on file   Comment: smokes, 100 + packs year history. Quit 2010  . Alcohol Use: 0.0 oz/week     occasionally  . Drug Use: No  . Sexually Active: Not on file   Other Topics Concern  . Not on file   Social History Narrative   No exercise.Disabled male who drinks alcohol occasionally.    ROS ALL NEGATIVE EXCEPT THOSE NOTED IN HPI  PE  General Appearance: well developed, well nourished in no acute distress HEENT: symmetrical face, PERRLA,  Neck: no JVD, thyromegaly, or adenopathy, trachea midline Chest: symmetric without deformity Cardiac: PMI non-displaced, RRR, normal S1, S2, no gallop or murmur Lung: clear to ausculation and percussion Vascular: all pulses full without bruits  Abdominal: nondistended, nontender, good bowel sounds, no HSM, no bruits Extremities: no cyanosis, clubbing or edema, no sign of DVT, no varicosities  Skin: normal color, no rashes Neuro: alert and oriented x 3, non-focal Pysch: normal affect  EKG  BMET    Component Value Date/Time   NA 137 07/10/2011 0939   NA 142 03/31/2011 1435   K 4.9 07/10/2011 0939   K 4.3 03/31/2011 1435   CL 103 07/10/2011 0939   CL 98 03/31/2011 1435   CO2 27 07/10/2011 0939   CO2 28 03/31/2011 1435   GLUCOSE 139* 07/10/2011 0939   GLUCOSE 108 03/31/2011 1435   BUN 20 07/10/2011 0939   BUN 26* 03/31/2011 1435   CREATININE 1.45* 07/10/2011 0939   CREATININE 1.8* 03/31/2011 1435   CALCIUM 9.3 07/10/2011 0939   CALCIUM 9.1 03/31/2011 1435   GFRNONAA 51* 07/10/2011 0939   GFRAA 59* 07/10/2011 0939    Lipid Panel     Component Value Date/Time   CHOL 221* 01/10/2010 1020   TRIG 124.0 01/10/2010 1020   HDL 48.00 01/10/2010 1020   CHOLHDL 5 01/10/2010 1020   VLDL 24.8 01/10/2010 1020   LDLCALC  Value: 28  Total Cholesterol/HDL:CHD Risk Coronary Heart  Disease Risk Table                     Men   Women  1/2 Average Risk   3.4   3.3  Average Risk       5.0   4.4  2 X Average Risk   9.6   7.1  3 X Average Risk  23.4   11.0        Use the calculated Patient Ratio above and the CHD Risk Table to determine the patient's CHD Risk.        ATP III CLASSIFICATION (LDL):  <100     mg/dL   Optimal  100-129  mg/dL   Near or Above                    Optimal  130-159  mg/dL   Borderline  160-189  mg/dL   High  >190     mg/dL   Very High 11/29/2009 0345    CBC    Component Value Date/Time   WBC 12.0* 07/10/2011 0515   WBC 13.1* 03/31/2011 1435   WBC 7.2 12/11/2005 1345   RBC 5.54 07/10/2011 0515   RBC 4.92 03/31/2011 1435   HGB 15.1 07/10/2011 0515   HGB 12.8* 03/31/2011 1435   HGB 16.5 12/11/2005 1345   HCT 46.9 07/10/2011 0515   HCT 39.9 03/31/2011 1435   HCT 50.2* 12/11/2005 1345   PLT 386 07/10/2011 0515   PLT 389 03/31/2011 1435   PLT 235 12/11/2005 1345   MCV 84.7 07/10/2011 0515   MCV 81.1 03/31/2011 1435   MCV 88 12/11/2005 1345   MCH 27.3 07/10/2011 0515   MCH 26.0* 03/31/2011 1435   MCH 28.8 12/11/2005 1345   MCHC 32.2 07/10/2011 0515   MCHC 32.0 03/31/2011 1435   MCHC 32.9 12/11/2005 1345   RDW 18.4* 07/10/2011 0515   RDW 17.8* 03/31/2011 1435   RDW 13.5 12/11/2005 1345   LYMPHSABS 5.8* 06/17/2011 1121   LYMPHSABS 5.1* 03/31/2011 1435   LYMPHSABS 3.5* 12/11/2005 1345   MONOABS 1.2* 06/17/2011 1121   MONOABS 0.9 03/31/2011 1435   EOSABS 0.1 06/17/2011 1121   EOSABS 0.3 03/31/2011 1435   EOSABS 0.2 12/11/2005 1345   BASOSABS 0.1 06/17/2011 1121   BASOSABS 0.2* 03/31/2011 1435   BASOSABS 0.1 12/11/2005 1345

## 2011-07-30 NOTE — Assessment & Plan Note (Signed)
Toprol was just increased to 50 mg twice a day. Followup with Dr. Edilia Bo. I would add an ACE inhibitor or ARB if he needs additional therapy.

## 2011-09-04 ENCOUNTER — Other Ambulatory Visit (HOSPITAL_BASED_OUTPATIENT_CLINIC_OR_DEPARTMENT_OTHER): Payer: Medicare Other

## 2011-09-04 ENCOUNTER — Ambulatory Visit (HOSPITAL_COMMUNITY)
Admission: RE | Admit: 2011-09-04 | Discharge: 2011-09-04 | Disposition: A | Discharge status: Home / Self Care | Payer: Medicare Other | Source: Ambulatory Visit | Attending: Internal Medicine | Admitting: Internal Medicine

## 2011-09-04 DIAGNOSIS — J984 Other disorders of lung: Secondary | ICD-10-CM | POA: Insufficient documentation

## 2011-09-04 DIAGNOSIS — R911 Solitary pulmonary nodule: Secondary | ICD-10-CM | POA: Insufficient documentation

## 2011-09-04 DIAGNOSIS — D751 Secondary polycythemia: Secondary | ICD-10-CM

## 2011-09-04 DIAGNOSIS — R0602 Shortness of breath: Secondary | ICD-10-CM | POA: Insufficient documentation

## 2011-09-04 DIAGNOSIS — R091 Pleurisy: Secondary | ICD-10-CM | POA: Insufficient documentation

## 2011-09-04 DIAGNOSIS — C3490 Malignant neoplasm of unspecified part of unspecified bronchus or lung: Secondary | ICD-10-CM

## 2011-09-04 DIAGNOSIS — R059 Cough, unspecified: Secondary | ICD-10-CM | POA: Insufficient documentation

## 2011-09-04 DIAGNOSIS — R05 Cough: Secondary | ICD-10-CM | POA: Insufficient documentation

## 2011-09-04 LAB — CBC WITH DIFFERENTIAL/PLATELET
Basophils Absolute: 0.1 10*3/uL (ref 0.0–0.1)
Eosinophils Absolute: 0.3 10*3/uL (ref 0.0–0.5)
HCT: 43.2 % (ref 38.4–49.9)
LYMPH%: 23.8 % (ref 14.0–49.0)
MCV: 86.5 fL (ref 79.3–98.0)
MONO#: 1.7 10*3/uL — ABNORMAL HIGH (ref 0.1–0.9)
MONO%: 10.4 % (ref 0.0–14.0)
NEUT#: 10.5 10*3/uL — ABNORMAL HIGH (ref 1.5–6.5)
NEUT%: 63.4 % (ref 39.0–75.0)
Platelets: 291 10*3/uL (ref 140–400)
RBC: 4.99 10*6/uL (ref 4.20–5.82)
WBC: 16.6 10*3/uL — ABNORMAL HIGH (ref 4.0–10.3)

## 2011-09-04 LAB — CMP (CANCER CENTER ONLY)
ALT(SGPT): 10 U/L (ref 10–47)
BUN, Bld: 27 mg/dL — ABNORMAL HIGH (ref 7–22)
CO2: 26 mEq/L (ref 18–33)
Calcium: 8.2 mg/dL (ref 8.0–10.3)
Chloride: 105 mEq/L (ref 98–108)
Creat: 1.6 mg/dl — ABNORMAL HIGH (ref 0.6–1.2)
Glucose, Bld: 112 mg/dL (ref 73–118)
Total Bilirubin: 0.6 mg/dl (ref 0.20–1.60)

## 2011-09-08 ENCOUNTER — Ambulatory Visit (HOSPITAL_BASED_OUTPATIENT_CLINIC_OR_DEPARTMENT_OTHER): Payer: Medicare Other | Admitting: Internal Medicine

## 2011-09-08 ENCOUNTER — Telehealth: Payer: Self-pay | Admitting: Internal Medicine

## 2011-09-08 VITALS — BP 123/68 | HR 59 | Temp 97.8°F | Ht 71.5 in | Wt 165.6 lb

## 2011-09-08 DIAGNOSIS — C3490 Malignant neoplasm of unspecified part of unspecified bronchus or lung: Secondary | ICD-10-CM

## 2011-09-08 MED ORDER — AZITHROMYCIN 250 MG PO TABS: ORAL_TABLET | ORAL | Status: AC

## 2011-09-08 NOTE — Telephone Encounter (Signed)
appts made and printed for pt aom °
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
## 2011-09-09 ENCOUNTER — Other Ambulatory Visit: Payer: Self-pay | Admitting: *Deleted

## 2011-09-09 MED ORDER — CLONAZEPAM 0.5 MG PO TABS: 0.5000 mg | ORAL_TABLET | Freq: Two times a day (BID) | ORAL | Status: DC | PRN

## 2011-09-09 NOTE — Telephone Encounter (Signed)
Last filled 08-01-2011 last office visit [phone]

## 2011-09-09 NOTE — Telephone Encounter (Signed)
Ok to refill 30, 2 refills

## 2011-09-09 NOTE — Telephone Encounter (Signed)
rx called to [facility]

## 2011-09-17 ENCOUNTER — Encounter: Payer: Self-pay | Admitting: Internal Medicine

## 2011-09-17 ENCOUNTER — Ambulatory Visit (INDEPENDENT_AMBULATORY_CARE_PROVIDER_SITE_OTHER): Payer: Medicare Other | Admitting: Internal Medicine

## 2011-09-17 VITALS — BP 116/78 | HR 57 | Ht 69.0 in | Wt 167.0 lb

## 2011-09-17 DIAGNOSIS — J4489 Other specified chronic obstructive pulmonary disease: Secondary | ICD-10-CM

## 2011-09-17 DIAGNOSIS — C3490 Malignant neoplasm of unspecified part of unspecified bronchus or lung: Secondary | ICD-10-CM

## 2011-09-17 DIAGNOSIS — J449 Chronic obstructive pulmonary disease, unspecified: Secondary | ICD-10-CM

## 2011-09-17 MED ORDER — MOMETASONE FURO-FORMOTEROL FUM 100-5 MCG/ACT IN AERO: 2.0000 | INHALATION_SPRAY | Freq: Two times a day (BID) | RESPIRATORY_TRACT | Status: DC | PRN

## 2011-09-17 MED ORDER — ALBUTEROL SULFATE HFA 108 (90 BASE) MCG/ACT IN AERS: 2.0000 | INHALATION_SPRAY | Freq: Four times a day (QID) | RESPIRATORY_TRACT | Status: DC | PRN

## 2011-09-17 MED ORDER — ALBUTEROL SULFATE (2.5 MG/3ML) 0.083% IN NEBU: 2.5000 mg | INHALATION_SOLUTION | Freq: Four times a day (QID) | RESPIRATORY_TRACT | Status: DC | PRN

## 2011-09-17 NOTE — Progress Notes (Signed)
Patient ID: [name], male    DOB: 08/18/1950, 61 y.o.   MRN: [id number]  HPI  30 yoM former heavy smoker. Post hospital consult. S/P RML and RLL lobectomy Feb, 2012 at [facility]. He had one round of chemotherapy in early April, 2012, then admitted [facility] with chills, fever, productive cough, dyspnea and leukocytosis w/ pleural effusion. Thoracentesis and all cultures no growth. Treated empirically with broad antibiotics-Primaxin/Vanc. Gradually improved, but with persistent leukocytosis blamed on hx of splenectomy, Neupogen. Bronchoscopy 09/10/10- nonspecific inflammation. Recent f/u WBC coming down. Patient refuses to consider further chemotherapy.  Now little cough, white phlegm, no fever, no blood, some night sweats.  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  CXR 09/19/10 clearing Right pneumonia; stable nodule c/w scar, right effusion vs post op pleural thickening. CXR 2 weeks ago at [facility].   12/18/10- 64 yoM former heavy smoker, squamous cell lung Ca S/P RML and RLL lobectomy Feb, 2012 at [place], hx splenectomy, Rt pleural effusion, LUL lung nodule Wife here Completed chemotherapy Dr Earlie Server.  CT 12/10/10- growing spiculated cavitary nodule LUL, increased gas in moderate right partially loculated pleural effusion-? BP fistula, stable fine reticulonodular opacities- infection vs lymphangitic ca?? Images reviewed with them. Minor cough- thick colorless phlegm, no blood or pain, or fever. No change in routine sweating.  Lab from thoracentesis- 10/14/10- all cultures Neg. Prot 3.4; LDH 219; cytology neg.  Office spirometry 12/18/10- mod restriction. FEV1 22.36/66%; FVC 2.87/ 63%; FEV1/FVC 0.82   03/19/11-  40 yoM former heavy smoker, squamous cell lung Ca S/P RML and RLL lobectomy Feb, 2012 at [facility], hx splenectomy, Rt pleural effusion, LUL lung nodule.Marland KitchenMarland KitchenMarland KitchenMarland Kitchen wife is here For the past week he has had more cough with yellow sputum and some shortness of breath. It may be a cold. No energy. He denies pain in the  chest but has aches in his back and joints. Asks prescription for Robaxin and also for potassium that he was taking for muscle aches. He sees Dr. Mohammed/oncology for followup scan in November.  09/17/11-60 yoM former heavy smoker, squamous cell lung Ca S/P RML and RLL lobectomy Feb, 2012 at [place], hx splenectomy, Rt pleural effusion, LUL lung nodule.Marland KitchenMarland KitchenMarland KitchenMarland Kitchen wife is here Has good and bad days; pollen is causing him to have breathing issues at this time Did okay through the winter. No recurrence of his cancer based on visit with Dr. Earlie Server. COPD slows him down. Admits daily cough, usually not productive. Daily use of rescue inhaler. Followup chest CT is pending in August.  Review of Systems-See HPI Constitutional:   No-   weight loss, night sweats, fevers, chills, fatigue, lassitude. HEENT:   No-  headaches, difficulty swallowing, tooth/dental problems, sore throat,       No-  sneezing, itching, ear ache, nasal congestion, post nasal drip,  CV:  No-  chest pain, orthopnea, PND, swelling in lower extremities, anasarca,  dizziness, palpitations Resp: No-   shortness of breath with exertion or at rest.              +  productive cough,  No non-productive cough,  No- coughing up of blood.              No-   change in color of mucus.  No- wheezing.   Skin: No-   rash or lesions. GI:  No-   heartburn, indigestion, abdominal pain, nausea, vomiting, GU:  MS:  +   joint pain or swelling.  No- decreased range of motion.  No- back  pain. Neuro-     nothing unusual Psych:  No- change in mood or affect. No depression or anxiety.  No memory loss.  Objective:   Physical Exam General- Alert, Oriented, Affect-appropriate, Distress- none acute Skin- rash-none, lesions- none, excoriation- none    + tatoos and scars Lymphadenopathy- none Head- atraumatic            Eyes- Gross vision intact, PERRLA, conjunctivae clear secretions            Ears- Hearing, canals            Nose- Clear, Septal dev, mucus,  polyps, erosion, perforation             Throat- Mallampati II , mucosa clear , drainage- none, tonsils- atrophic   + dentures Neck- flexible , trachea midline, no stridor , thyroid nl, carotid no bruit Chest - symmetrical excursion , unlabored           Heart/CV- RRR , no murmur , no gallop  , no rub, nl s1 s2                           - JVD- none , edema- none, stasis changes- none, varices- none           Lung- clear to P&A, wheeze- none, cough- none , + dull- lower 2/3 right chest, rub- none           Chest wall-  Abd-  Br/ Gen/ Rectal- Not done, not indicated Extrem- cyanosis- none, clubbing, none, atrophy- none, strength- nl Neuro- grossly intact to observation

## 2011-09-17 NOTE — Patient Instructions (Signed)
Order- DME- refer for meds for home nebulizer    Dx COPD  Scripts printed, including nebulizer solution.  Sample Dulera 100    2 puffs then rinse mouth well , twice daily

## 2011-09-20 NOTE — Assessment & Plan Note (Signed)
They understand him to be in remission.

## 2011-09-20 NOTE — Assessment & Plan Note (Signed)
Dyspnea on exertion and daily cough are stable. There is little sputum, no blood and no anginal chest pain.

## 2011-10-28 ENCOUNTER — Other Ambulatory Visit: Payer: Self-pay | Admitting: *Deleted

## 2011-10-28 NOTE — Telephone Encounter (Signed)
Faxed refill request from [facility], last filled 60 on 08/01/11.

## 2011-10-29 ENCOUNTER — Other Ambulatory Visit: Payer: Self-pay | Admitting: *Deleted

## 2011-10-29 MED ORDER — LANSOPRAZOLE 30 MG PO CPDR: 30.0000 mg | DELAYED_RELEASE_CAPSULE | Freq: Every day | ORAL | Status: DC

## 2011-10-29 MED ORDER — CLONAZEPAM 0.5 MG PO TABS: 0.5000 mg | ORAL_TABLET | Freq: Two times a day (BID) | ORAL | Status: DC | PRN

## 2011-10-29 NOTE — Telephone Encounter (Signed)
rx called to pharmacy 

## 2011-10-29 NOTE — Telephone Encounter (Signed)
OK to refill? This was originally sent in as part of a prevpak by Dr. Darnell Level. In january

## 2011-10-29 NOTE — Telephone Encounter (Signed)
Ok to refill #60, 3 refills

## 2011-10-29 NOTE — Telephone Encounter (Signed)
Ok #30, 11 refills

## 2012-01-05 ENCOUNTER — Other Ambulatory Visit (HOSPITAL_BASED_OUTPATIENT_CLINIC_OR_DEPARTMENT_OTHER): Payer: Medicare Other | Admitting: Lab

## 2012-01-05 ENCOUNTER — Ambulatory Visit (HOSPITAL_COMMUNITY)
Admission: RE | Admit: 2012-01-05 | Discharge: 2012-01-05 | Disposition: A | Discharge status: Home / Self Care | Payer: Medicare Other | Source: Ambulatory Visit | Attending: Internal Medicine | Admitting: Internal Medicine

## 2012-01-05 DIAGNOSIS — I517 Cardiomegaly: Secondary | ICD-10-CM | POA: Insufficient documentation

## 2012-01-05 DIAGNOSIS — Z951 Presence of aortocoronary bypass graft: Secondary | ICD-10-CM | POA: Insufficient documentation

## 2012-01-05 DIAGNOSIS — C3490 Malignant neoplasm of unspecified part of unspecified bronchus or lung: Secondary | ICD-10-CM

## 2012-01-05 DIAGNOSIS — N62 Hypertrophy of breast: Secondary | ICD-10-CM | POA: Insufficient documentation

## 2012-01-05 DIAGNOSIS — J984 Other disorders of lung: Secondary | ICD-10-CM | POA: Insufficient documentation

## 2012-01-05 DIAGNOSIS — R091 Pleurisy: Secondary | ICD-10-CM | POA: Insufficient documentation

## 2012-01-05 DIAGNOSIS — Z902 Acquired absence of lung [part of]: Secondary | ICD-10-CM | POA: Insufficient documentation

## 2012-01-05 DIAGNOSIS — Z9221 Personal history of antineoplastic chemotherapy: Secondary | ICD-10-CM | POA: Insufficient documentation

## 2012-01-05 LAB — CMP (CANCER CENTER ONLY)
ALT(SGPT): 19 U/L (ref 10–47)
CO2: 30 mEq/L (ref 18–33)
Calcium: 9.4 mg/dL (ref 8.0–10.3)
Chloride: 100 mEq/L (ref 98–108)
Creat: 2 mg/dl — ABNORMAL HIGH (ref 0.6–1.2)
Glucose, Bld: 112 mg/dL (ref 73–118)
Total Protein: 7.6 g/dL (ref 6.4–8.1)

## 2012-01-05 LAB — CBC WITH DIFFERENTIAL/PLATELET
BASO%: 0.7 % (ref 0.0–2.0)
Eosinophils Absolute: 0.5 10*3/uL (ref 0.0–0.5)
HCT: 46.3 % (ref 38.4–49.9)
HGB: 15 g/dL (ref 13.0–17.1)
MCHC: 32.3 g/dL (ref 32.0–36.0)
MONO#: 1.3 10*3/uL — ABNORMAL HIGH (ref 0.1–0.9)
NEUT#: 6.5 10*3/uL (ref 1.5–6.5)
NEUT%: 50.9 % (ref 39.0–75.0)
Platelets: 255 10*3/uL (ref 140–400)
WBC: 12.8 10*3/uL — ABNORMAL HIGH (ref 4.0–10.3)
lymph#: 4.5 10*3/uL — ABNORMAL HIGH (ref 0.9–3.3)

## 2012-01-05 MED ORDER — IOHEXOL 300 MG/ML  SOLN
80.0000 mL | Freq: Once | INTRAMUSCULAR | Status: AC | PRN
  Administered 2012-01-05: 80 mL via INTRAVENOUS

## 2012-01-07 ENCOUNTER — Ambulatory Visit (HOSPITAL_BASED_OUTPATIENT_CLINIC_OR_DEPARTMENT_OTHER): Payer: Medicare Other | Admitting: Internal Medicine

## 2012-01-07 ENCOUNTER — Telehealth: Payer: Self-pay | Admitting: Internal Medicine

## 2012-01-07 VITALS — BP 137/76 | HR 59 | Temp 97.8°F | Resp 20 | Ht 69.0 in | Wt 164.9 lb

## 2012-01-07 DIAGNOSIS — C3430 Malignant neoplasm of lower lobe, unspecified bronchus or lung: Secondary | ICD-10-CM

## 2012-01-07 DIAGNOSIS — C3490 Malignant neoplasm of unspecified part of unspecified bronchus or lung: Secondary | ICD-10-CM

## 2012-01-07 DIAGNOSIS — C342 Malignant neoplasm of middle lobe, bronchus or lung: Secondary | ICD-10-CM

## 2012-01-07 NOTE — Telephone Encounter (Signed)
Gave pt appt for February 2014 lab and Ct then MD visit
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
## 2012-01-29 ENCOUNTER — Other Ambulatory Visit: Payer: Self-pay | Admitting: Internal Medicine

## 2012-01-29 MED ORDER — MELOXICAM 15 MG PO TABS: 15.0000 mg | ORAL_TABLET | Freq: Every day | ORAL | Status: DC

## 2012-03-09 ENCOUNTER — Telehealth: Payer: Self-pay | Admitting: Family Medicine

## 2012-03-09 ENCOUNTER — Telehealth: Payer: Self-pay | Admitting: *Deleted

## 2012-03-09 DIAGNOSIS — K229 Disease of esophagus, unspecified: Secondary | ICD-10-CM

## 2012-03-09 NOTE — Telephone Encounter (Signed)
Wife left VM at triage stating that the patient has been at [facility] in [place] for the past few days and has been diagnosed with pre-cancerous lesions of the esophagus.  They were told to ask their PCP for a referral to GI ASAP.

## 2012-03-09 NOTE — Telephone Encounter (Signed)
Caller: Elizabeth/Spouse; Patient Name: [name]; PCP: [name] [facility]); Best Callback Phone Number: [phone]. Caller states patient had steak caught in his throat the previous weekend;went to [facility] ED in [place], [place] and provider  noticed some lesions that were biopsied and determined to be precancerous.  Immediate follow up was recommended with a local provider.   Caller reports patient has history of Lung Cancer with 2 lobes of right lung removed.  Caller requests referral to GI specialist for follow up.

## 2012-03-10 ENCOUNTER — Ambulatory Visit (INDEPENDENT_AMBULATORY_CARE_PROVIDER_SITE_OTHER): Payer: Medicare Other | Admitting: Physician Assistant

## 2012-03-10 ENCOUNTER — Telehealth: Payer: Self-pay | Admitting: Internal Medicine

## 2012-03-10 ENCOUNTER — Encounter: Payer: Self-pay | Admitting: *Deleted

## 2012-03-10 VITALS — BP 128/76 | HR 72 | Ht 69.0 in | Wt 162.8 lb

## 2012-03-10 DIAGNOSIS — K222 Esophageal obstruction: Secondary | ICD-10-CM

## 2012-03-10 DIAGNOSIS — K219 Gastro-esophageal reflux disease without esophagitis: Secondary | ICD-10-CM

## 2012-03-10 DIAGNOSIS — K2270 Barrett's esophagus without dysplasia: Secondary | ICD-10-CM

## 2012-03-10 DIAGNOSIS — R1310 Dysphagia, unspecified: Secondary | ICD-10-CM

## 2012-03-10 MED ORDER — LANSOPRAZOLE 30 MG PO CPDR: 30.0000 mg | DELAYED_RELEASE_CAPSULE | Freq: Every day | ORAL | Status: DC

## 2012-03-10 NOTE — Progress Notes (Signed)
Subjective:    Patient ID: [name], male    DOB: 03/21/1951, 61 y.o.   MRN: [id number]  HPI  [name] is a pleasant 61 year old white male with multiple medical problems .He is known to Dr. Carlean Purl. He has history of coronary artery disease is status post CABG, has COPD and history of stage II A. non-small cell lung cancer. He is followed by Dr. Earlie Server. He was diagnosed in 2011 and underwent right middle and lower lobectomies at [facility] February 2012. He took one cycle of adjuvant chemotherapy after that but then discontinued chemotherapy and has been observed since. He had recent evaluation with Dr. Earlie Server and a CT scan of the chest and August of 2013 noted to have a slight increase in size of a small cavitary lesion in the left upper lobe and stable right chronic fibrothorax but no evidence for disease recurrence. Is to have a followup in 6 months. He does have history of an esophageal stricture and had EGD with dilation per Dr. Carlean Purl in 2003. He says he has taken acid blockers intermittently since then really has not had any problems with ongoing heartburn over the past few years. He quit drinking alcohol and also quit smoking since that time. He had an episode of a food impaction on 02/29/2012 while he was traveling to the beach. He had eaten steak with his breakfast that morning felt a piece of steak become lodged and was unable to dislodge it. Is unable to tolerate liquids and then proceeded onto the emergency room. He underwent upper endoscopy and removal of food impaction per Dr. Cleotilde Neer and we do have copies of those reports. He was found to have a benign-appearing esophageal stricture about 12 mm in diameter and probable Barrett's esophagus. The food bolus was removed he was not dilated as he had inflammation in his distal esophagus. He did have biopsies done which, back positive for Barrett's without dysplasia. He says that he really had not been having any regular  difficulty swallowing prior to that incident and has been swallowing fine since but has been avoiding steak.    Review of Systems  Constitutional: Negative.   HENT: Positive for trouble swallowing.   Eyes: Negative.   Respiratory: Negative.   Cardiovascular: Negative.   Gastrointestinal: Negative.   Genitourinary: Negative.   Musculoskeletal: Negative.   Skin: Negative.   Neurological: Negative.   Hematological: Negative.   Psychiatric/Behavioral: Negative.    Outpatient Prescriptions Prior to Visit  Medication Sig Dispense Refill  . albuterol (PROVENTIL) (2.5 MG/3ML) 0.083% nebulizer solution Take 3 mLs (2.5 mg total) by nebulization every 6 (six) hours as needed for wheezing or shortness of breath.  50 mL  prn  . albuterol (VENTOLIN HFA) 108 (90 BASE) MCG/ACT inhaler Inhale 2 puffs into the lungs every 6 (six) hours as needed for wheezing or shortness of breath.  18 g  prn  . amphetamine-dextroamphetamine (ADDERALL, 30MG ,) 30 MG tablet Take 30 mg by mouth 2 (two) times daily.        [name] aspirin 81 MG tablet Take 81 mg by mouth daily.        [name] atorvastatin (LIPITOR) 80 MG tablet Take 80 mg by mouth at bedtime.      . cetirizine (ZYRTEC) 10 MG tablet Take 10 mg by mouth daily as needed. For allergies      . Cholecalciferol (VITAMIN D) 1000 UNITS capsule Take 1,000 Units by mouth daily.        . clonazePAM (  KLONOPIN) 0.5 MG tablet Take 1 tablet (0.5 mg total) by mouth 2 (two) times daily as needed. For anxiety  60 tablet  3  . dextromethorphan-guaiFENesin (MUCINEX DM) 30-600 MG per 12 hr tablet Take 1 tablet by mouth every 12 (twelve) hours.        . diclofenac sodium (VOLTAREN) 1 % GEL Apply 1 application topically 2 (two) times daily as needed. For pain      . gabapentin (NEURONTIN) 300 MG capsule Take 300-600 mg by mouth 2 (two) times daily. 1 tab am  And 2 tabs pm      . meloxicam (MOBIC) 15 MG tablet Take 1 tablet (15 mg total) by mouth daily.  90 tablet  3  . methocarbamol  (ROBAXIN) 500 MG tablet Take 1 tablet (500 mg total) by mouth every 6 (six) hours as needed (muscle ache and stiffness).  50 tablet  1  . metoprolol succinate (TOPROL-XL) 50 MG 24 hr tablet Take 1 tablet (50 mg total) by mouth 2 (two) times daily.  180 tablet  3  . nitroGLYCERIN (NITROSTAT) 0.4 MG SL tablet Place 0.4 mg under the tongue every 5 (five) minutes as needed. For chest pain      . Oxycodone HCl (OXYCONTIN) 60 MG TB12 Take 1 tablet by mouth 2 (two) times daily.       [name] oxyCODONE-acetaminophen (PERCOCET) 10-325 MG per tablet Take as directed for breakthrough pain      . sildenafil (VIAGRA) 100 MG tablet Take 100 mg by mouth daily as needed. For erectile dysfunction      . lansoprazole (PREVACID) 30 MG capsule Take 1 capsule (30 mg total) by mouth daily.  30 capsule  11  . mometasone-formoterol (DULERA) 100-5 MCG/ACT AERO Inhale 2 puffs into the lungs 2 (two) times daily as needed for wheezing or shortness of breath. For wheezing  1 Inhaler  prn   Allergies  Allergen Reactions  . Morphine And Related    Patient Active Problem List  Diagnosis  . HERPES SIMPLEX WITHOUT MENTION OF COMPLICATION  . HYPERCHOLESTEROLEMIA  . HYPERTENSION  . CAD, ARTERY BYPASS GRAFT  . GERD  . HEPATITIS  . PROSTATITIS, CHRONIC  . ORGANIC IMPOTENCE  . ARTHRITIS  . FATIGUE  . CARCINOMA, LUNG, SQUAMOUS CELL  . Pleural effusion, right  . COPD (chronic obstructive pulmonary disease)  . Anxiety  . CKD (chronic kidney disease)  . Peripheral vascular disease  . Coronary artery disease       History   Social History  . Marital Status: Married    Spouse Name: N/A    Number of Children: 3  . Years of Education: N/A   Occupational History  . disabled    Social History Main Topics  . Smoking status: Former Smoker -- 2.0 packs/day for 45 years    Types: Cigarettes    Quit date: 01/24/2009  . Smokeless tobacco: Never Used   Comment: smokes, 100 + packs year history. Quit 2010  . Alcohol Use: 0.0  oz/week     occasionally  . Drug Use: No  . Sexually Active: Not on file   Other Topics Concern  . Not on file   Social History Narrative   No exercise.Disabled male who drinks alcohol occasionally.    Objective:   Physical Exam well-developed white male in no acute distress, accompanied by his wife blood pressure 128/76 pulse 72 height 5 foot 9 weight 162. HEENT; nontraumatic normocephalic EOMI PERRLA sclera anicteric,Neck; Supple no JVD, Cardiovascular; regular  rate and rhythm with S1-S2 no murmur or gallop he does have a sternal incisional scar, Pulmonary; clear bilaterally significant decrease breath sounds right base, Abdomen ;soft midline incisional scar no palpable mass or hepatosplenomegaly, Bowel sounds are active, Rectal; not done, Extremities; no clubbing cyanosis or edema skin warm and dry, Psych; mood and affect normal and appropriate        Assessment & Plan:  #51 61 year old male with recent esophageal food impaction treated with upper endoscopy and removal of the food bolus in [facility]. Noted to have a benign-appearing distal esophageal stricture and evidence for Barrett's esophagus at that time. Patient with prior history of esophageal stricture and dilation 2003. Currently asymptomatic  #2 stage II A. squamous cell lung cancer diagnosed 2011 and status post  right middle and right lower lobectomies, being observed since with no evidence for recurrence #3 coronary artery disease status post CABG #4 COPD #5 peripheral vascular disease #6 history of atrial flutter status post ablation #7 hypertension #8 colon neoplasia surveillance-patient had colonoscopy with Dr. Carlean Purl 2010 which was negative  Plan; #1 patient has been taking over-the-counter Prevacid 30 mg by mouth daily, have advised she continue a daily PPI and will convert this to prescription for Prevacid 30 mg by mouth daily #2 schedule for upper endoscopy with [facility]ary dilation of the esophagus  per Dr. Carlean Purl. Procedure was discussed in detail with the patient and his wife including risks and they are agreeable to proceed there #3 careful chewing and chopped meats only in the interim

## 2012-03-10 NOTE — Telephone Encounter (Signed)
[name] will fax me the records when she gets them from [place].

## 2012-03-10 NOTE — Patient Instructions (Signed)
You have been scheduled for an endoscopy with propofol. Please follow written instructions given to you at your visit today. If you use inhalers (even only as needed), please bring them with you on the day of your procedure.  We have sent the following medications to your pharmacy for you to pick up at your convenience: Prevacid. Please take one tablet by mouth once daily

## 2012-03-10 NOTE — Progress Notes (Signed)
Patient ID: [name], male   DOB: 1951-04-21, 61 y.o.   MRN: [id number]

## 2012-03-10 NOTE — Telephone Encounter (Signed)
Records here from [name].  Patient had meat impaction while at the beach. Bx also showed Barrett's esophagus. Patient will come in and see Amy Esterwood PA today at 1:30

## 2012-03-11 ENCOUNTER — Ambulatory Visit (AMBULATORY_SURGERY_CENTER): Payer: Medicare Other | Admitting: Internal Medicine

## 2012-03-11 ENCOUNTER — Encounter: Payer: Self-pay | Admitting: Vascular Surgery

## 2012-03-11 ENCOUNTER — Telehealth: Payer: Self-pay | Admitting: *Deleted

## 2012-03-11 ENCOUNTER — Encounter: Payer: Self-pay | Admitting: Internal Medicine

## 2012-03-11 VITALS — BP 118/76 | HR 54 | Temp 97.6°F | Resp 13 | Ht 69.0 in | Wt 162.0 lb

## 2012-03-11 DIAGNOSIS — K219 Gastro-esophageal reflux disease without esophagitis: Secondary | ICD-10-CM

## 2012-03-11 DIAGNOSIS — K222 Esophageal obstruction: Secondary | ICD-10-CM

## 2012-03-11 DIAGNOSIS — R1310 Dysphagia, unspecified: Secondary | ICD-10-CM

## 2012-03-11 DIAGNOSIS — K2270 Barrett's esophagus without dysplasia: Secondary | ICD-10-CM

## 2012-03-11 HISTORY — DX: Barrett's esophagus without dysplasia: K22.70

## 2012-03-11 MED ORDER — SODIUM CHLORIDE 0.9 % IV SOLN: 500.0000 mL | INTRAVENOUS | Status: DC

## 2012-03-11 NOTE — Telephone Encounter (Signed)
Pt. Scheduled for colonoscopy 04/15/12 at 1330.  To have previsit on April 02, 2012 at 1400.  Spoke with wife she verbalized understanding of appointments. Will call if she has any questions.

## 2012-03-11 NOTE — Progress Notes (Signed)
Agree with Ms. Esterwood's assessment and plan. Adonis Ryther E. Michol Emory, MD, FACG   

## 2012-03-11 NOTE — Patient Instructions (Addendum)
I dilated or stretched the esophagus today. Did not see a narrow area but with your history thought it made sense to stretch this. You also have a condition called Barrett's esophagus - would recheck that in 3-5 years.  Your last colonoscopy did not have a good prep so it is appropriate to repeat one. We will contact you about setting one up.If you do not hear from [place] by November please call us back.  Thank you for choosing me and [facility].  Gatha Mayer, MD, FACG YOU HAD AN ENDOSCOPIC PROCEDURE TODAY AT [facility]: Refer to the procedure report that was given to you for any specific questions about what was found during the examination.  If the procedure report does not answer your questions, please call your gastroenterologist to clarify.  If you requested that your care partner not be given the details of your procedure findings, then the procedure report has been included in a sealed envelope for you to review at your convenience later.  YOU SHOULD EXPECT: Some feelings of bloating in the abdomen. Passage of more gas than usual.  Walking can help get rid of the air that was put into your GI tract during the procedure and reduce the bloating. If you had a lower endoscopy (such as a colonoscopy or flexible sigmoidoscopy) you may notice spotting of blood in your stool or on the toilet paper. If you underwent a bowel prep for your procedure, then you may not have a normal bowel movement for a few days.  DIET: Your first meal following the procedure should be a light meal and then it is ok to progress to your normal diet.  A half-sandwich or bowl of soup is an example of a good first meal.  Heavy or fried foods are harder to digest and may make you feel nauseous or bloated.  Likewise meals heavy in dairy and vegetables can cause extra gas to form and this can also increase the bloating.  Drink plenty of fluids but you should avoid alcoholic beverages for 24  hours.  ACTIVITY: Your care partner should take you home directly after the procedure.  You should plan to take it easy, moving slowly for the rest of the day.  You can resume normal activity the day after the procedure however you should NOT DRIVE or use heavy machinery for 24 hours (because of the sedation medicines used during the test).    SYMPTOMS TO REPORT IMMEDIATELY: A gastroenterologist can be reached at any hour.  During normal business hours, 8:30 AM to 5:00 PM Monday through Friday, call [phone].  After hours and on weekends, please call the GI answering service at [phone] who will take a message and have the physician on call contact you.     Excessive amounts of blood in the stoollFollowing upper endoscopy               Vomiting of blood or coffee ground material  New chest pain or pain under the shoulder blades  Painful or persistently difficult swallowing  New shortness of breath  Fever of 100F or higher  Black, tarry-looking stools  FOLLOW UP: If any biopsies were taken you will be contacted by phone or by letter within the next 1-3 weeks.  Call your gastroenterologist if you have not heard about the biopsies in 3 weeks.  Our staff will call the home number listed on your records the next business day following your procedure to check on  you and address any questions or concerns that you may have at that time regarding the information given to you following your procedure. This is a courtesy call and so if there is no answer at the home number and we have not heard from you through the emergency physician on call, we will assume that you have returned to your regular daily activities without incident.  SIGNATURES/CONFIDENTIALITY: You and/or your care partner have signed paperwork which will be entered into your electronic medical record.  These signatures attest to the fact that that the information above on your After Visit Summary has been reviewed and is  understood.  Full responsibility of the confidentiality of this discharge information lies with you and/or your care-partner.   Barrett's Esophagus, gastritis information given.  Clear liquids until 11AM, then soft foods for rest of day.   Regular diet tomorrow.

## 2012-03-11 NOTE — Op Note (Addendum)
[place]  [address] [place], [address]   ENDOSCOPY PROCEDURE REPORT  PATIENT: [name], [name]  MR#: [id number] BIRTHDATE: 11-12-1950 , 61  yrs. old GENDER: Male ENDOSCOPIST: Gatha Mayer, MD, [facility] REFERRED BY: PROCEDURE DATE:  03/11/2012 PROCEDURE:  EGD, diagnostic and Maloney dilation of esophagus ASA CLASS:     Class III INDICATIONS:  dysphagia.  recent food impaction with suspected stricture MEDICATIONS: propofol (Diprivan) 150mg  IV, MAC sedation, administered by CRNA, and These medications were titrated to patient response per physician's verbal order TOPICAL ANESTHETIC: Cetacaine Spray  DESCRIPTION OF PROCEDURE: After the risks benefits and alternatives of the procedure were thoroughly explained, informed consent was obtained.  The [facility] GIF-H180 [id number] endoscope was introduced through the mouth and advanced to the second portion of the duodenum. Without limitations.  The instrument was slowly withdrawn as the mucosa was fully examined.      ESOPHAGUS: There was evidence of Barrett's esophagus.   44-45 cm. Previously biopsiesd withinh last month.  STOMACH: Mild gastritis (inflammation) was found.   Throughout the stomach.  The remainder of the upper endoscopy exam was otherwise normal. Retroflexed views revealed no abnormalities.     The scope was then withdrawn from the patient and the procedure completed.  COMPLICATIONS: There were no complications. ENDOSCOPIC IMPRESSION: 1.   There was evidence of Barrett's esophagus 2.   Gastritis (inflammation) was found 3.   The remainder of the upper endoscopy exam was otherwise normal - dilated 6 [place] given dysphagia  RECOMMENDATIONS: Clear liquids until 11 AM , then soft foods rest iof day.  Resume prior diet tomorrow. NEEDS TO HAVE A COLONOSCOPY AND PRE-VISIT SCHEDULED BECAUSE LAST COLONOSCOPY 2010 INCOMPLETE PREP REPEAT EXAM: In 3 year(s)  for EGD .  [name]  Barrett's  eSigned:  Gatha Mayer, MD, [facility] 03/11/2012 10:00 AM   CC:The Patient and Kathryne Eriksson, MD

## 2012-03-11 NOTE — Progress Notes (Signed)
Patient did not experience any of the following events: a burn prior to discharge; a fall within the facility; wrong site/side/patient/procedure/implant event; or a hospital transfer or hospital admission upon discharge from the facility. [phone]) Patient did not have preoperative order for IV antibiotic SSI prophylaxis. [phone])   Pt. Was not connected to monitor to bring vital signs over to chart.  VS were monintored by nurse visually and were stable.

## 2012-03-12 ENCOUNTER — Other Ambulatory Visit: Payer: Self-pay | Admitting: Family Medicine

## 2012-03-12 ENCOUNTER — Telehealth: Payer: Self-pay | Admitting: *Deleted

## 2012-03-12 DIAGNOSIS — C140 Malignant neoplasm of pharynx, unspecified: Secondary | ICD-10-CM

## 2012-03-12 NOTE — Telephone Encounter (Signed)
Left message asking wife to call back.

## 2012-03-12 NOTE — Telephone Encounter (Signed)
No answer, left message to call if questions or concerns. 

## 2012-03-12 NOTE — Telephone Encounter (Signed)
Done  Please let them know we are trying asap   [name], MD 03/12/2012, 1:27 PM

## 2012-03-16 ENCOUNTER — Ambulatory Visit: Payer: Medicare Other | Admitting: Internal Medicine

## 2012-03-17 ENCOUNTER — Ambulatory Visit: Payer: Medicare Other | Admitting: Internal Medicine

## 2012-03-19 NOTE — Telephone Encounter (Signed)
Pt was seen by GI on 03/11/12, pt is awaiting biopsy results.

## 2012-04-15 ENCOUNTER — Encounter: Payer: Medicare Other | Admitting: Internal Medicine

## 2012-05-05 IMAGING — CR DG CHEST DECUBITUS*L*
1 series · 1 of 1 positions shown · non-contrast
Comparison: Chest radiographs obtained earlier today.

CLINICAL DATA: Fever.  Right lower lobe collapse and right pleural
fluid on chest radiographs earlier today.

CHEST - LEFT DECUBITUS

[w chest decub.]
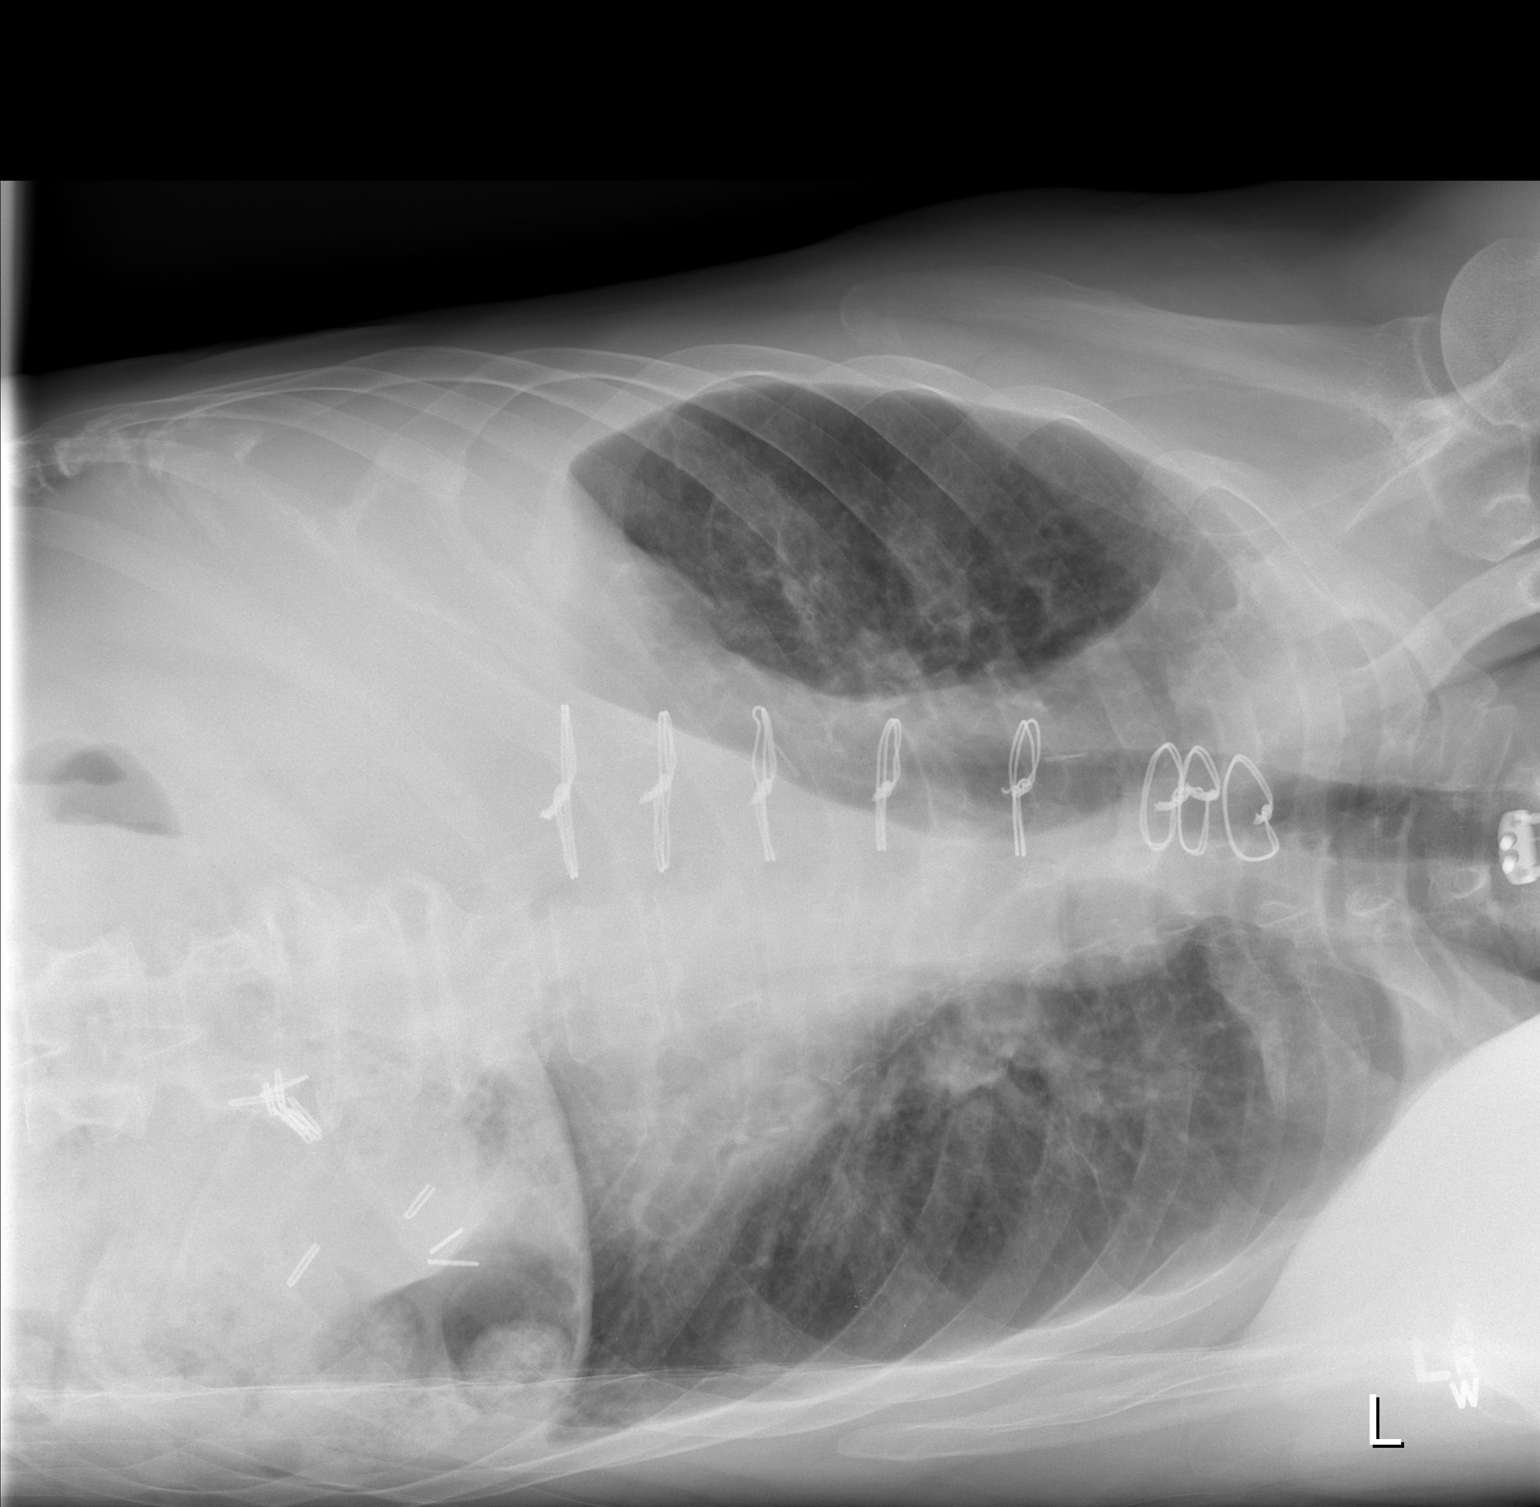

[1 of 1 positions shown; findings below may reference images not displayed]

FINDINGS: Minimal nonmobile left pleural fluid or thickening.
Previously noted right pleural fluid and right lower lobe opacity.
IMPRESSION: 1.  Minimal nonmobile left pleural fluid or thickening.
2.  Previously noted right pleural fluid and right lower lobe
atelectasis or pneumonia.

## 2012-05-05 IMAGING — CR DG CHEST 2V
2 series · 2 of 2 positions shown · non-contrast
Comparison: 06/19/2010

CLINICAL DATA: Fever.

CHEST - 2 VIEW

[w chest pa]
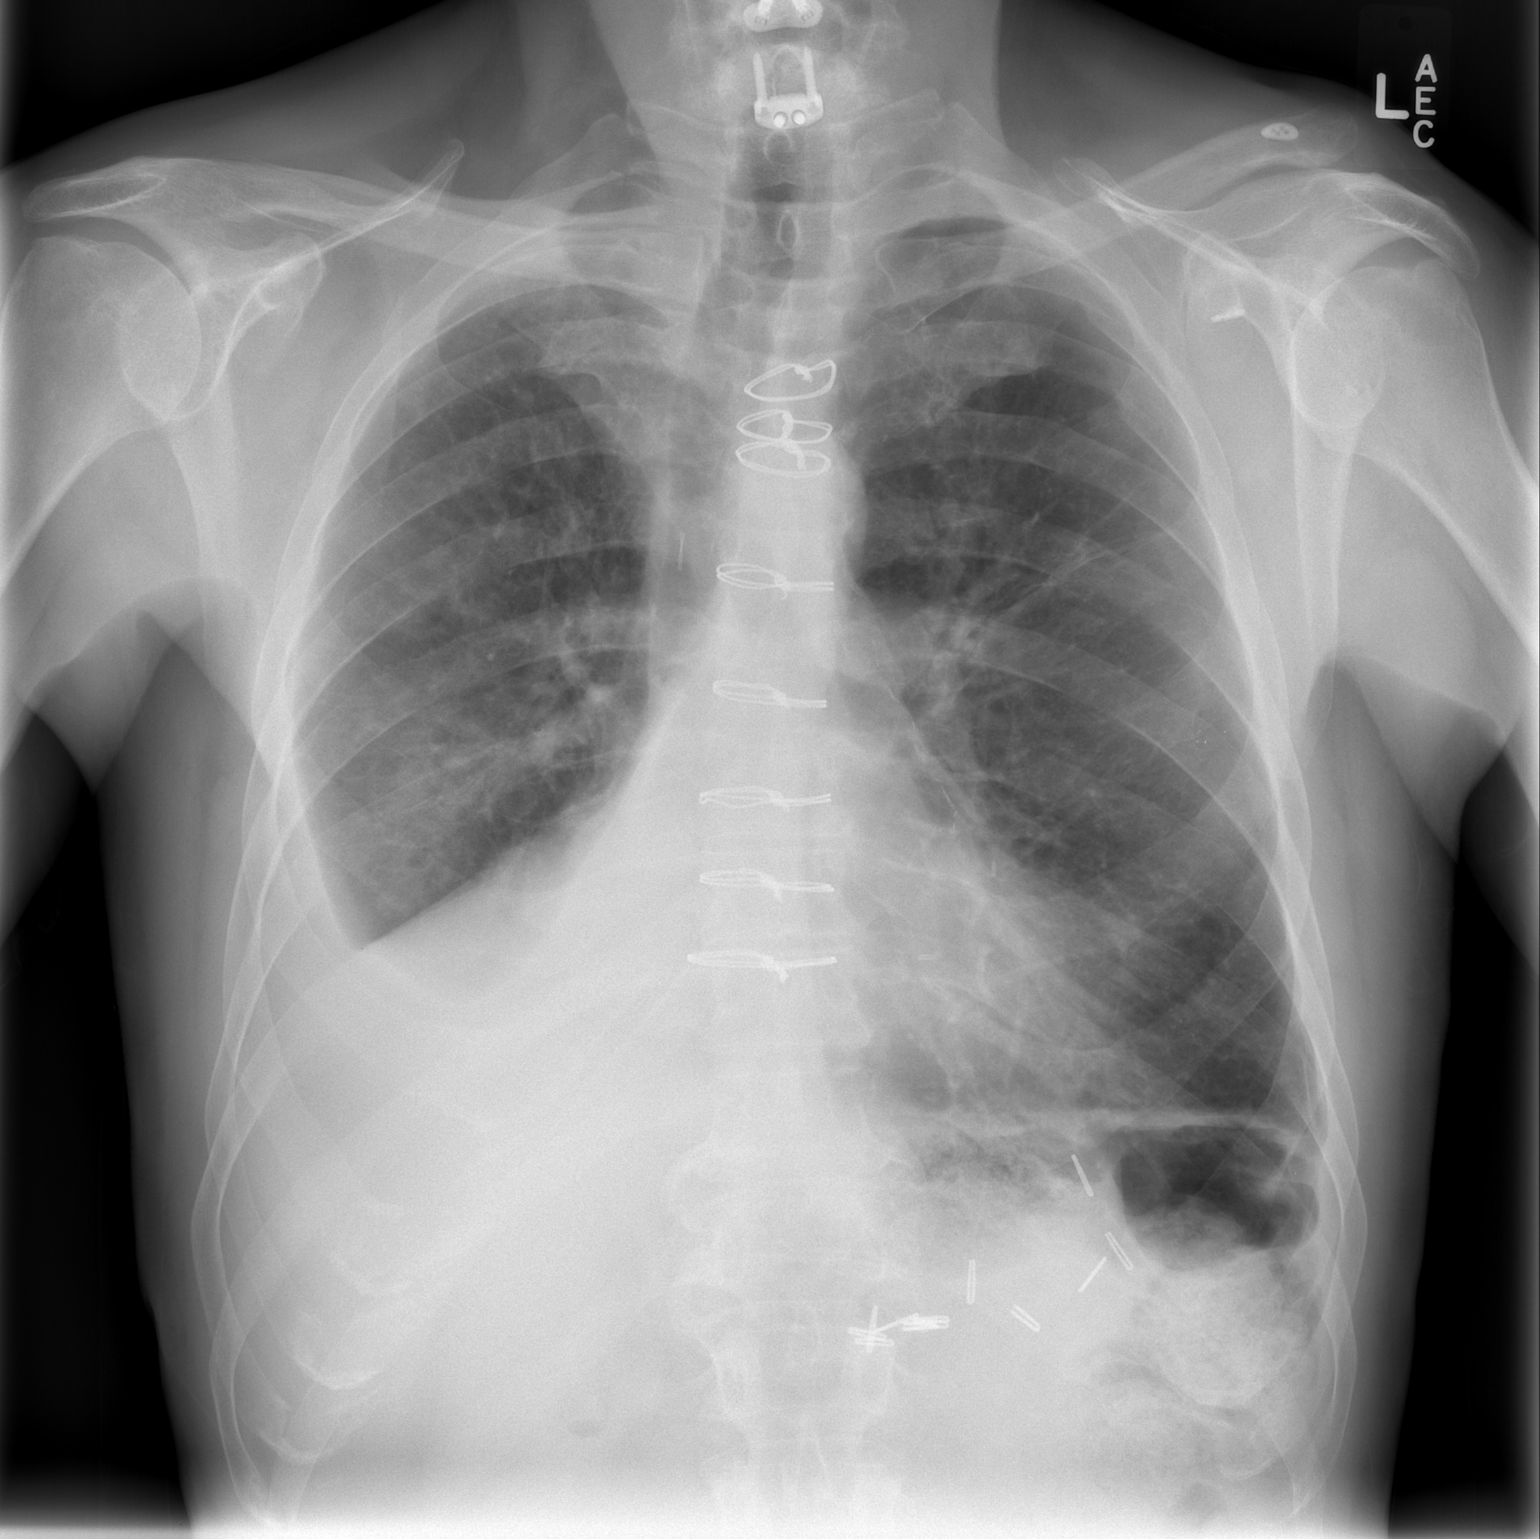

[w chest lat]
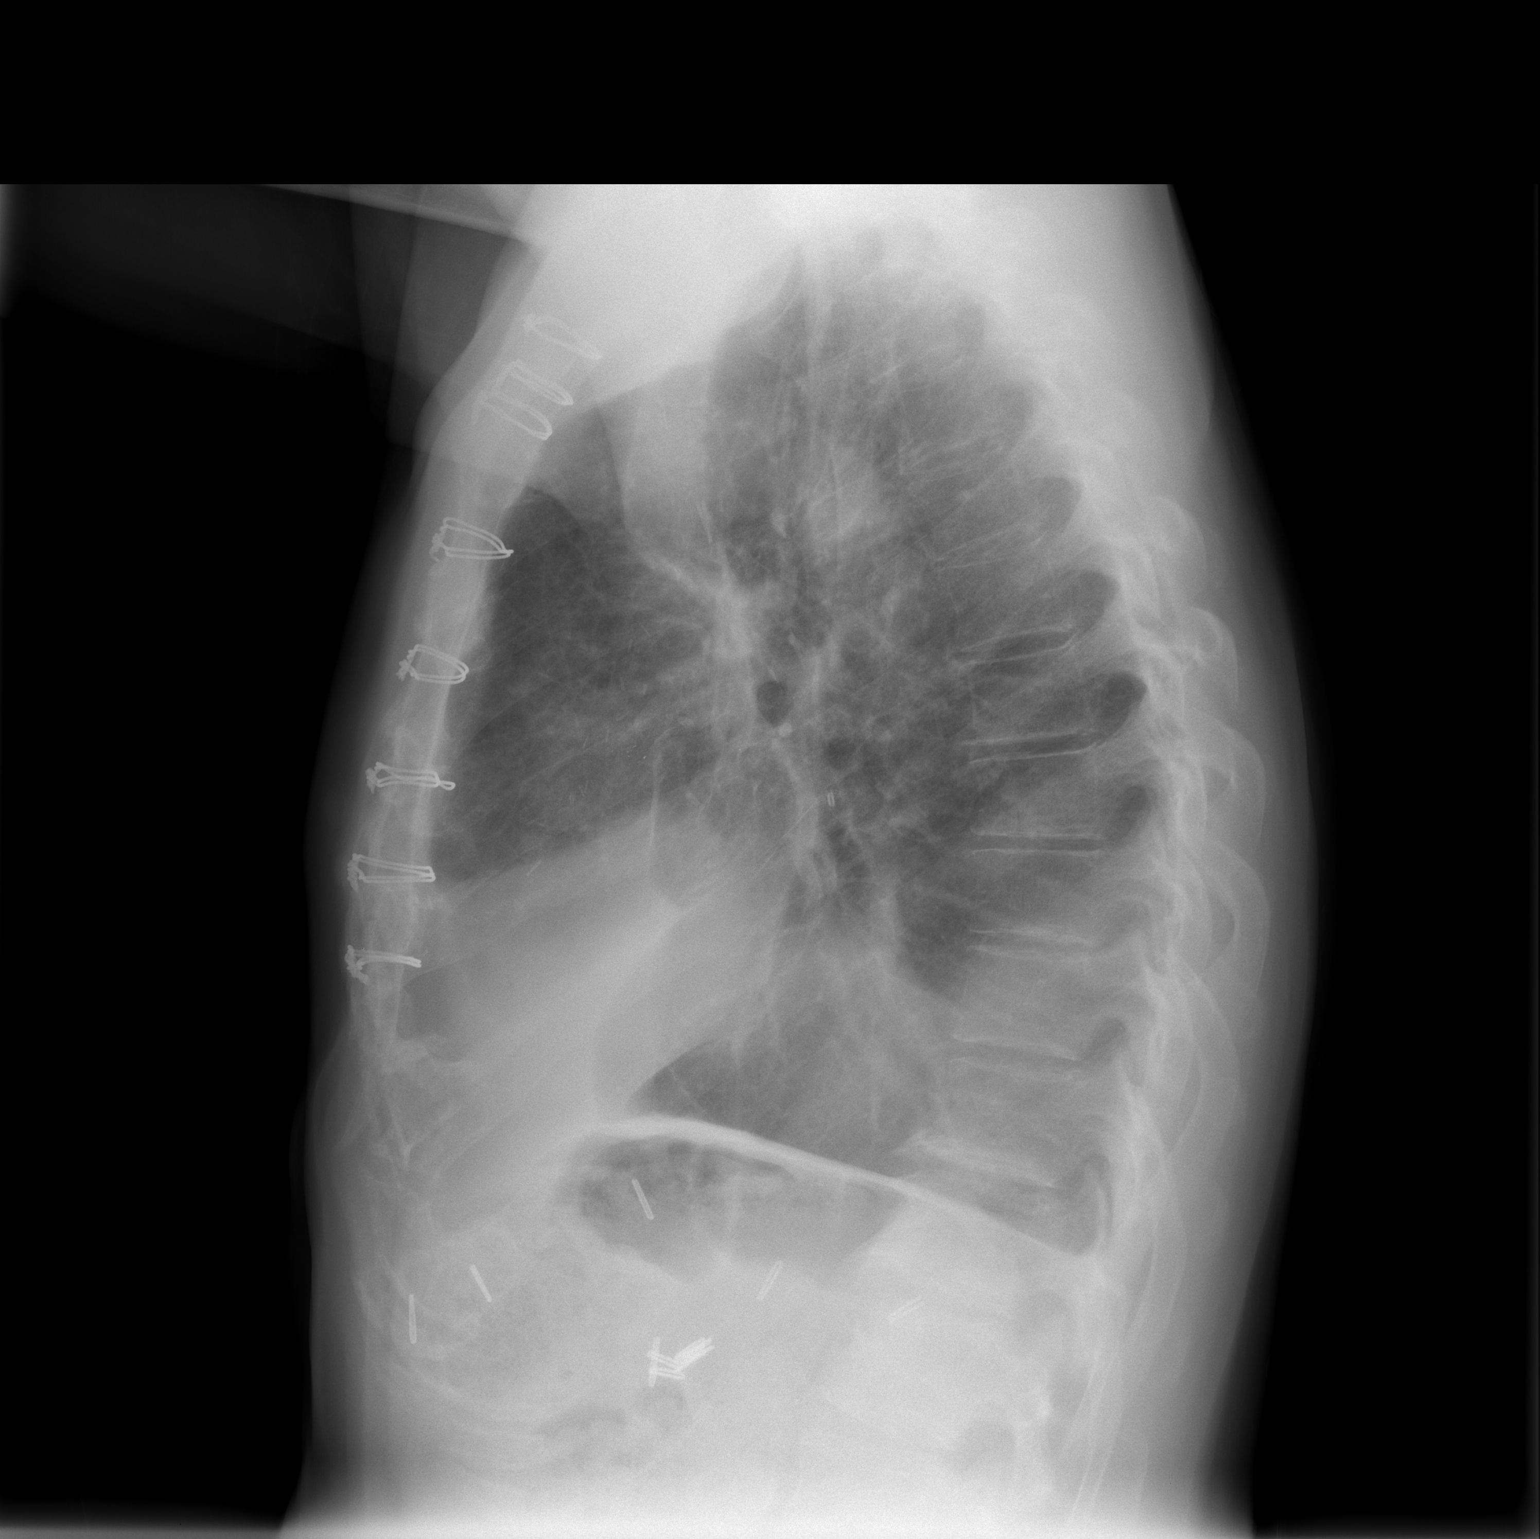

[2 of 2 positions shown; findings below may reference images not displayed]

FINDINGS: The patient has developed complete tear collapse of the
right lower lobe, probably due to bronchial obstruction, possibly
by tumor.  There is a moderate right pleural effusion as well.
There is mild pulmonary vascular congestion in the right upper
lobe.

The left lung is clear.  No acute osseous abnormality.
IMPRESSION: Complete collapse of the right lower lobe, possibly due to
bronchial obstruction by tumor.

Moderate right effusion.

## 2012-05-20 ENCOUNTER — Telehealth: Payer: Self-pay | Admitting: Family Medicine

## 2012-05-20 NOTE — Telephone Encounter (Signed)
Patient Information:  Caller Name: [name]  Phone: [phone]  Patient: [name], [name]  Gender: Male  DOB: 04-Apr-1951  Age: 61 Years  PCP: [name] (Family Practice)  Office Follow Up:  Does the office need to follow up with this patient?: Yes  Instructions For The Office: ASAP; please call back to advise if needs Tamiflu for flu exposure; high risk but has only runny nose X 4 days.  RN Note:  History of lung cancer and spleenectomy; No fever, sore throat, headache or body aches. Pharmacy: [facility] [phone].   Symptoms  Reason For Call & Symptoms: Exposed to grandchild who was diagnosed with Influenza 05/13/12.  Daughter has symptoms and currently at MD office.  Currently has runny nose for apst 3-4 days.  Reviewed Health History In EMR: Yes  Reviewed Medications In EMR: Yes  Reviewed Allergies In EMR: Yes  Reviewed Surgeries / Procedures: Yes  Date of Onset of Symptoms: 05/16/2012  Treatments Tried: keeps house wiped down  Treatments Tried Worked: No  Guideline(s) Used:  Influenza Exposure  Influenza - Seasonal  Disposition Per Guideline:   Discuss with PCP and Callback by Nurse within 1 Hour  Reason For Disposition Reached:   HIGH RISK (e.g., age > 68 years, pregnant, HIV+, chronic medical condition) and flu symptoms  Advice Given:  N/A

## 2012-05-21 MED ORDER — OSELTAMIVIR PHOSPHATE 75 MG PO CAPS: 75.0000 mg | ORAL_CAPSULE | Freq: Every day | ORAL | Status: DC

## 2012-05-21 NOTE — Telephone Encounter (Signed)
Too high risk, would use tamiflu  tamiflu 75 mg, 1 po daily for 7 days, #7

## 2012-05-21 NOTE — Telephone Encounter (Signed)
Patient advised and rx sent to pharmacy

## 2012-05-28 ENCOUNTER — Other Ambulatory Visit: Payer: Self-pay | Admitting: *Deleted

## 2012-05-28 MED ORDER — ALBUTEROL SULFATE HFA 108 (90 BASE) MCG/ACT IN AERS: 2.0000 | INHALATION_SPRAY | Freq: Four times a day (QID) | RESPIRATORY_TRACT | Status: DC | PRN

## 2012-06-02 ENCOUNTER — Other Ambulatory Visit: Payer: Self-pay | Admitting: *Deleted

## 2012-06-02 ENCOUNTER — Other Ambulatory Visit: Payer: Self-pay | Admitting: Family Medicine

## 2012-06-02 DIAGNOSIS — I10 Essential (primary) hypertension: Secondary | ICD-10-CM

## 2012-06-02 MED ORDER — METOPROLOL SUCCINATE ER 50 MG PO TB24: 50.0000 mg | ORAL_TABLET | Freq: Two times a day (BID) | ORAL | Status: DC

## 2012-06-02 MED ORDER — CLONAZEPAM 0.5 MG PO TABS: 0.5000 mg | ORAL_TABLET | Freq: Two times a day (BID) | ORAL | Status: DC | PRN

## 2012-06-02 MED ORDER — ATORVASTATIN CALCIUM 80 MG PO TABS: 80.0000 mg | ORAL_TABLET | Freq: Every day | ORAL | Status: DC

## 2012-06-02 MED ORDER — LANSOPRAZOLE 30 MG PO CPDR: 30.0000 mg | DELAYED_RELEASE_CAPSULE | Freq: Every day | ORAL | Status: DC

## 2012-06-02 NOTE — Telephone Encounter (Signed)
Needs 3 month rx, going out of town for 3 months

## 2012-06-02 NOTE — Telephone Encounter (Signed)
Ok to refill 

## 2012-06-02 NOTE — Telephone Encounter (Signed)
rx called to pharmacy 

## 2012-06-02 NOTE — Telephone Encounter (Signed)
sent 

## 2012-06-02 NOTE — Telephone Encounter (Signed)
Ok to refill #60, 3 refills

## 2012-06-28 ENCOUNTER — Telehealth: Payer: Self-pay | Admitting: *Deleted

## 2012-06-28 DIAGNOSIS — C3490 Malignant neoplasm of unspecified part of unspecified bronchus or lung: Secondary | ICD-10-CM

## 2012-06-28 NOTE — Telephone Encounter (Signed)
Per Dr Vista Mink, pt needs MRI brain STAT due to mental status changes per telephone note from 06/28/12.  Order placed in EPIC.  Pt's wife aware.  SLJ

## 2012-06-28 NOTE — Telephone Encounter (Signed)
FOR A MONTH PT. BECOMES HOARSE AT TIMES. ALSO PT. IS HAVING PROBLEMS WITH HIS "SHORT TERM MEMORY" FOR TWO MONTHS. HE HAS TO SEARCH FOR HIS WORDS. PT. HAS LAB AND CT SCAN ON 07/02/12 AND SEES DR.MOHAMED ON 07/06/12. THIS NOTE TO DR.MOHAMED'S NURSE, STEPHANIE JOHNSON,RN.

## 2012-06-29 ENCOUNTER — Telehealth: Payer: Self-pay | Admitting: Internal Medicine

## 2012-06-29 ENCOUNTER — Other Ambulatory Visit: Payer: Self-pay | Admitting: *Deleted

## 2012-06-29 DIAGNOSIS — C3490 Malignant neoplasm of unspecified part of unspecified bronchus or lung: Secondary | ICD-10-CM

## 2012-06-30 ENCOUNTER — Other Ambulatory Visit (HOSPITAL_BASED_OUTPATIENT_CLINIC_OR_DEPARTMENT_OTHER): Payer: Medicare Other | Admitting: Lab

## 2012-06-30 ENCOUNTER — Telehealth: Payer: Self-pay | Admitting: *Deleted

## 2012-06-30 ENCOUNTER — Ambulatory Visit
Admission: RE | Admit: 2012-06-30 | Discharge: 2012-06-30 | Disposition: A | Discharge status: Home / Self Care | Payer: Medicare Other | Source: Ambulatory Visit | Attending: Internal Medicine | Admitting: Internal Medicine

## 2012-06-30 DIAGNOSIS — C3490 Malignant neoplasm of unspecified part of unspecified bronchus or lung: Secondary | ICD-10-CM

## 2012-06-30 LAB — COMPREHENSIVE METABOLIC PANEL (CC13)
ALT: 7 U/L (ref 0–55)
AST: 18 U/L (ref 5–34)
CO2: 27 mEq/L (ref 22–29)
Sodium: 137 mEq/L (ref 136–145)
Total Bilirubin: 0.52 mg/dL (ref 0.20–1.20)
Total Protein: 7.2 g/dL (ref 6.4–8.3)

## 2012-06-30 LAB — CBC WITH DIFFERENTIAL/PLATELET
BASO%: 0.7 % (ref 0.0–2.0)
Eosinophils Absolute: 0.5 10*3/uL (ref 0.0–0.5)
MCHC: 32.7 g/dL (ref 32.0–36.0)
MCV: 87.9 fL (ref 79.3–98.0)
MONO#: 1.3 10*3/uL — ABNORMAL HIGH (ref 0.1–0.9)
MONO%: 10.5 % (ref 0.0–14.0)
NEUT#: 4.9 10*3/uL (ref 1.5–6.5)
RBC: 5.68 10*6/uL (ref 4.20–5.82)
RDW: 15.8 % — ABNORMAL HIGH (ref 11.0–14.6)
WBC: 12.2 10*3/uL — ABNORMAL HIGH (ref 4.0–10.3)
nRBC: 0 % (ref 0–0)

## 2012-06-30 MED ORDER — GADOBENATE DIMEGLUMINE 529 MG/ML IV SOLN
8.0000 mL | Freq: Once | INTRAVENOUS | Status: AC | PRN
  Administered 2012-06-30: 8 mL via INTRAVENOUS

## 2012-06-30 NOTE — Telephone Encounter (Signed)
Per Dr Vista Mink, MRI brain negative, informed pt's wife, she verbalized understanding.  SLJ

## 2012-07-02 ENCOUNTER — Other Ambulatory Visit: Payer: Medicare Other | Admitting: Lab

## 2012-07-02 ENCOUNTER — Telehealth: Payer: Self-pay | Admitting: *Deleted

## 2012-07-02 ENCOUNTER — Other Ambulatory Visit: Payer: Self-pay | Admitting: *Deleted

## 2012-07-02 ENCOUNTER — Encounter (HOSPITAL_COMMUNITY): Payer: Self-pay

## 2012-07-02 ENCOUNTER — Ambulatory Visit (HOSPITAL_COMMUNITY)
Admission: RE | Admit: 2012-07-02 | Discharge: 2012-07-02 | Disposition: A | Discharge status: Home / Self Care | Payer: Medicare Other | Source: Ambulatory Visit | Attending: Internal Medicine | Admitting: Internal Medicine

## 2012-07-02 DIAGNOSIS — C3490 Malignant neoplasm of unspecified part of unspecified bronchus or lung: Secondary | ICD-10-CM

## 2012-07-02 DIAGNOSIS — M47814 Spondylosis without myelopathy or radiculopathy, thoracic region: Secondary | ICD-10-CM | POA: Insufficient documentation

## 2012-07-02 DIAGNOSIS — I2510 Atherosclerotic heart disease of native coronary artery without angina pectoris: Secondary | ICD-10-CM | POA: Insufficient documentation

## 2012-07-02 DIAGNOSIS — J984 Other disorders of lung: Secondary | ICD-10-CM | POA: Insufficient documentation

## 2012-07-02 DIAGNOSIS — K7689 Other specified diseases of liver: Secondary | ICD-10-CM | POA: Insufficient documentation

## 2012-07-02 MED ORDER — IOHEXOL 300 MG/ML  SOLN
80.0000 mL | Freq: Once | INTRAMUSCULAR | Status: AC | PRN
  Administered 2012-07-02: 80 mL via INTRAVENOUS

## 2012-07-02 NOTE — Telephone Encounter (Signed)
Pt called worried about whether it was okay for him to get more contrast with his CT chest today after having an MRI brain done on 2/5.  Called and spoke to the CT dept, they calculated that it would be okay for pt to receive contrast with his currently lab work, weight and age.  Informed pt.  SLJ

## 2012-07-06 ENCOUNTER — Ambulatory Visit (HOSPITAL_BASED_OUTPATIENT_CLINIC_OR_DEPARTMENT_OTHER): Payer: Medicare Other | Admitting: Internal Medicine

## 2012-07-06 ENCOUNTER — Telehealth: Payer: Self-pay | Admitting: Internal Medicine

## 2012-07-06 VITALS — BP 127/73 | HR 60 | Temp 97.6°F | Resp 20 | Ht 69.0 in | Wt 170.2 lb

## 2012-07-06 DIAGNOSIS — C3490 Malignant neoplasm of unspecified part of unspecified bronchus or lung: Secondary | ICD-10-CM

## 2012-07-06 NOTE — Telephone Encounter (Signed)
Gave pt appt for lab and MD on August 2014 before CT

## 2012-07-06 NOTE — Patient Instructions (Signed)
No evidence for disease recurrence on the recent scan. Followup in 6 months with repeat CT scan of the chest.  
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
## 2012-07-07 ENCOUNTER — Other Ambulatory Visit: Payer: Self-pay | Admitting: *Deleted

## 2012-07-07 MED ORDER — MOMETASONE FURO-FORMOTEROL FUM 200-5 MCG/ACT IN AERO: 2.0000 | INHALATION_SPRAY | RESPIRATORY_TRACT | Status: DC | PRN

## 2012-07-22 ENCOUNTER — Telehealth: Payer: Self-pay | Admitting: *Deleted

## 2012-07-22 NOTE — Telephone Encounter (Signed)
Pt is requesting letter for dental clearance for the [facility] dental school.  They want Dr Vista Mink to state that his chemo has caused damage to his teeth.  Per Dr Vista Mink, he only had one chemo cycle and this would not have caused any dental issues.  It is okay to write letter stating from an oncology standpoint okay for pt to have dental exam and treatment.  Letter mailed to pt's home per wife's request, copy scanned into EPIC.  SLJ

## 2012-11-12 ENCOUNTER — Telehealth: Payer: Self-pay | Admitting: *Deleted

## 2012-11-12 ENCOUNTER — Emergency Department (INDEPENDENT_AMBULATORY_CARE_PROVIDER_SITE_OTHER)
Admission: EM | Admit: 2012-11-12 | Discharge: 2012-11-12 | Disposition: A | Discharge status: Nursing Home (Includes ICF) | Payer: Medicare Other | Source: Home / Self Care | Attending: Family Medicine | Admitting: Family Medicine

## 2012-11-12 ENCOUNTER — Emergency Department (HOSPITAL_COMMUNITY): Payer: Medicare Other

## 2012-11-12 ENCOUNTER — Emergency Department (INDEPENDENT_AMBULATORY_CARE_PROVIDER_SITE_OTHER): Payer: Medicare Other

## 2012-11-12 ENCOUNTER — Telehealth: Payer: Self-pay | Admitting: Family Medicine

## 2012-11-12 ENCOUNTER — Encounter (HOSPITAL_COMMUNITY): Payer: Self-pay | Admitting: *Deleted

## 2012-11-12 ENCOUNTER — Encounter (HOSPITAL_COMMUNITY): Payer: Self-pay | Admitting: Emergency Medicine

## 2012-11-12 ENCOUNTER — Emergency Department (HOSPITAL_COMMUNITY)
Admission: EM | Admit: 2012-11-12 | Discharge: 2012-11-12 | Disposition: A | Discharge status: Home / Self Care | Payer: Medicare Other | Attending: Emergency Medicine | Admitting: Emergency Medicine

## 2012-11-12 DIAGNOSIS — I10 Essential (primary) hypertension: Secondary | ICD-10-CM | POA: Insufficient documentation

## 2012-11-12 DIAGNOSIS — Z9889 Other specified postprocedural states: Secondary | ICD-10-CM | POA: Insufficient documentation

## 2012-11-12 DIAGNOSIS — R1084 Generalized abdominal pain: Secondary | ICD-10-CM | POA: Insufficient documentation

## 2012-11-12 DIAGNOSIS — J449 Chronic obstructive pulmonary disease, unspecified: Secondary | ICD-10-CM | POA: Insufficient documentation

## 2012-11-12 DIAGNOSIS — E7800 Pure hypercholesterolemia, unspecified: Secondary | ICD-10-CM | POA: Insufficient documentation

## 2012-11-12 DIAGNOSIS — IMO0002 Reserved for concepts with insufficient information to code with codable children: Secondary | ICD-10-CM | POA: Insufficient documentation

## 2012-11-12 DIAGNOSIS — Z87828 Personal history of other (healed) physical injury and trauma: Secondary | ICD-10-CM | POA: Insufficient documentation

## 2012-11-12 DIAGNOSIS — Z923 Personal history of irradiation: Secondary | ICD-10-CM | POA: Insufficient documentation

## 2012-11-12 DIAGNOSIS — R109 Unspecified abdominal pain: Secondary | ICD-10-CM

## 2012-11-12 DIAGNOSIS — J4489 Other specified chronic obstructive pulmonary disease: Secondary | ICD-10-CM | POA: Insufficient documentation

## 2012-11-12 DIAGNOSIS — R5381 Other malaise: Secondary | ICD-10-CM | POA: Insufficient documentation

## 2012-11-12 DIAGNOSIS — Z7982 Long term (current) use of aspirin: Secondary | ICD-10-CM | POA: Insufficient documentation

## 2012-11-12 DIAGNOSIS — R042 Hemoptysis: Secondary | ICD-10-CM

## 2012-11-12 DIAGNOSIS — Z8679 Personal history of other diseases of the circulatory system: Secondary | ICD-10-CM | POA: Insufficient documentation

## 2012-11-12 DIAGNOSIS — K219 Gastro-esophageal reflux disease without esophagitis: Secondary | ICD-10-CM | POA: Insufficient documentation

## 2012-11-12 DIAGNOSIS — I2510 Atherosclerotic heart disease of native coronary artery without angina pectoris: Secondary | ICD-10-CM | POA: Insufficient documentation

## 2012-11-12 DIAGNOSIS — Z791 Long term (current) use of non-steroidal anti-inflammatories (NSAID): Secondary | ICD-10-CM | POA: Insufficient documentation

## 2012-11-12 DIAGNOSIS — R5383 Other fatigue: Secondary | ICD-10-CM | POA: Insufficient documentation

## 2012-11-12 DIAGNOSIS — Z8719 Personal history of other diseases of the digestive system: Secondary | ICD-10-CM | POA: Insufficient documentation

## 2012-11-12 DIAGNOSIS — N179 Acute kidney failure, unspecified: Secondary | ICD-10-CM

## 2012-11-12 DIAGNOSIS — Z85118 Personal history of other malignant neoplasm of bronchus and lung: Secondary | ICD-10-CM | POA: Insufficient documentation

## 2012-11-12 DIAGNOSIS — R110 Nausea: Secondary | ICD-10-CM | POA: Insufficient documentation

## 2012-11-12 DIAGNOSIS — Z79899 Other long term (current) drug therapy: Secondary | ICD-10-CM | POA: Insufficient documentation

## 2012-11-12 DIAGNOSIS — Z87891 Personal history of nicotine dependence: Secondary | ICD-10-CM | POA: Insufficient documentation

## 2012-11-12 DIAGNOSIS — M129 Arthropathy, unspecified: Secondary | ICD-10-CM | POA: Insufficient documentation

## 2012-11-12 DIAGNOSIS — Z951 Presence of aortocoronary bypass graft: Secondary | ICD-10-CM | POA: Insufficient documentation

## 2012-11-12 LAB — COMPREHENSIVE METABOLIC PANEL
ALT: 10 U/L (ref 0–53)
AST: 22 U/L (ref 0–37)
Albumin: 3.8 g/dL (ref 3.5–5.2)
Calcium: 9.3 mg/dL (ref 8.4–10.5)
Chloride: 99 mEq/L (ref 96–112)
Creatinine, Ser: 2.53 mg/dL — ABNORMAL HIGH (ref 0.50–1.35)
Sodium: 136 mEq/L (ref 135–145)
Total Bilirubin: 0.4 mg/dL (ref 0.3–1.2)

## 2012-11-12 LAB — CBC WITH DIFFERENTIAL/PLATELET
Basophils Absolute: 0.1 10*3/uL (ref 0.0–0.1)
Basophils Relative: 1 % (ref 0–1)
HCT: 51.6 % (ref 39.0–52.0)
Lymphocytes Relative: 47 % — ABNORMAL HIGH (ref 12–46)
MCHC: 33.3 g/dL (ref 30.0–36.0)
Monocytes Absolute: 1.1 10*3/uL — ABNORMAL HIGH (ref 0.1–1.0)
Neutro Abs: 5 10*3/uL (ref 1.7–7.7)
Neutrophils Relative %: 40 % — ABNORMAL LOW (ref 43–77)
Platelets: 264 10*3/uL (ref 150–400)
RDW: 14.8 % (ref 11.5–15.5)
WBC: 12.5 10*3/uL — ABNORMAL HIGH (ref 4.0–10.5)

## 2012-11-12 LAB — PROTIME-INR: INR: 0.93 (ref 0.00–1.49)

## 2012-11-12 MED ORDER — PREDNISONE 20 MG PO TABS
60.0000 mg | ORAL_TABLET | ORAL | Status: AC
  Administered 2012-11-12: 60 mg via ORAL
  Filled 2012-11-12: qty 3

## 2012-11-12 MED ORDER — SODIUM CHLORIDE 0.9 % IV BOLUS (SEPSIS)
1000.0000 mL | Freq: Once | INTRAVENOUS | Status: AC
  Administered 2012-11-12: 1000 mL via INTRAVENOUS

## 2012-11-12 MED ORDER — PREDNISONE 20 MG PO TABS: 60.0000 mg | ORAL_TABLET | Freq: Every day | ORAL | Status: AC

## 2012-11-12 NOTE — ED Notes (Signed)
No changes from triage assessment.  Patient is alert and oriented reprots pain in his lower back that is chronic.

## 2012-11-12 NOTE — Telephone Encounter (Signed)
Patient Information:  Caller Name: Eustaquio Maize  Phone: [phone]  Patient: [name], [name]  Gender: Male  DOB: 1950-08-03  Age: 62 Years  PCP: [name] (Family Practice)  Office Follow Up:  Does the office need to follow up with this patient?: Yes  Instructions For The Office: OFFICE, can pt be worked into the schedule as soon as possible for coughing up blood.  Please follow up with caller.  RN Note:  pt does have some SOB, but no difficulty breathing.  No chest pain.  Symptoms  Reason For Call & Symptoms: caller reports pt is coughing up blood - bright red blood .  Caller also states pt has been having stomach problems and spouse called his cancer doctor (Dr Earlie Server) who advised caller to call office first  Reviewed Health History In EMR: Yes  Reviewed Medications In EMR: Yes  Reviewed Allergies In EMR: Yes  Reviewed Surgeries / Procedures: Yes  Date of Onset of Symptoms: 11/11/2012  Guideline(s) Used:  Cough  Disposition Per Guideline:   Go to Office Now  Reason For Disposition Reached:   Coughed up > 1 tablespoon (15 ml) blood (Exception: blood-tinged sputum)  Advice Given:  N/A  Patient Will Follow Care Advice:  YES

## 2012-11-12 NOTE — Telephone Encounter (Addendum)
Spoke with [employer], pt started yesterday with spitting up bright red blood with mucus; pt has had upper abd pain upon exertion for one month. Pt has had 2 lobes rt lung removed due to lung CA, no fever. Pt wants referral to Dr Inda Merlin office today. Dr Diona Browner said OK to contact Dr Inda Merlin office to see if pt could be seen. Spoke with Mrs Dema Severin at Dr Inda Merlin office, Dr Inda Merlin is out of office and on call dr will go to ED if pt needs to be seen after eval by ER doctor. Pt advised and pt said he will go to [facility] now.

## 2012-11-12 NOTE — ED Notes (Signed)
Coughing up blood.  Onset yesterday, reports at least 5 episodes since first episode yesterday.  No pain.
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
## 2012-11-12 NOTE — ED Notes (Signed)
Placed mask on patient for transport

## 2012-11-12 NOTE — ED Notes (Signed)
Pt sent here from [facility] for further eval due to coughing up blood since yesterday.hx of lung ca in 2012. Pt denies any pain, mask on pt at triage, airway intact. Has xray done at [facility], showed small pleural effusion.

## 2012-11-12 NOTE — ED Notes (Signed)
Provided blankets 
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
## 2012-11-12 NOTE — Telephone Encounter (Signed)
PT.'S WIFE STATES THE BLOOD IS BRIGHT RED WITH A SMALL AMOUNT OF DARK RED BLOOD. PT. HAS NOT COUGHED UP ANYTHING TODAY. HE IS A LITTLE MORE SHORT OF BREATH THAN NORMAL FOR HIM. PT. STATES HIS VOICE IS MORE RASPY THAN NORMAL. PT. ALSO HAS HAD MIDDLE UPPER ABDOMINAL PAIN WITH MOVEMENT FOR ABOUT A MONTH. HIS LAST GOOD BOWEL MOVEMENT WAS 11/10/12. NO BLOOD NOTICED IN HIS STOOL. PT. AND HIS WIFE ARE AT THE [facility] FOR A PROBLEM WITH PT.'S CORNEA. THIS NOTE TO DR.MOHAMED'S NURSE, STEPHANIE JOHNSON,RN.

## 2012-11-12 NOTE — Telephone Encounter (Signed)
SPOKE TO DR.MOHAMED'S NURSE, STEPHANIE JOHNSON,RN,. SINCE PT. IS ON OBSERVATION HE SHOULD SEE HIS PRIMARY CARE PHYSICIAN. IF PT. COUGHS UP A LARGE AMOUNT OF BLOOD HE NEEDS TO GO TO THE EMERGENCY ROOM TO BE EVALUATED. DR.MOHAMED WOULD BE NOTIFIED IF HE IS NEEDED TO ASSIST WITH PT.'S CONDITION. NOTIFIED PT.'S WIFE. SHE VOICES UNDERSTANDING.

## 2012-11-12 NOTE — Telephone Encounter (Signed)
No appointment available in our office at all today, but pt does need to be seen. Agree with disposition.

## 2012-11-17 ENCOUNTER — Encounter: Payer: Self-pay | Admitting: Radiology

## 2012-11-18 ENCOUNTER — Encounter: Payer: Self-pay | Admitting: Family Medicine

## 2012-11-18 ENCOUNTER — Telehealth: Payer: Self-pay | Admitting: Internal Medicine

## 2012-11-18 ENCOUNTER — Ambulatory Visit (INDEPENDENT_AMBULATORY_CARE_PROVIDER_SITE_OTHER): Payer: Medicare Other | Admitting: Family Medicine

## 2012-11-18 VITALS — BP 130/82 | HR 67 | Temp 97.4°F | Ht 70.0 in | Wt 163.2 lb

## 2012-11-18 DIAGNOSIS — R042 Hemoptysis: Secondary | ICD-10-CM

## 2012-11-18 DIAGNOSIS — C3490 Malignant neoplasm of unspecified part of unspecified bronchus or lung: Secondary | ICD-10-CM

## 2012-11-18 DIAGNOSIS — N179 Acute kidney failure, unspecified: Secondary | ICD-10-CM

## 2012-11-18 DIAGNOSIS — N184 Chronic kidney disease, stage 4 (severe): Secondary | ICD-10-CM

## 2012-11-18 LAB — BASIC METABOLIC PANEL
CO2: 28 mEq/L (ref 19–32)
Chloride: 102 mEq/L (ref 96–112)
Glucose, Bld: 73 mg/dL (ref 70–99)
Sodium: 139 mEq/L (ref 135–145)
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
## 2012-11-18 NOTE — Telephone Encounter (Signed)
i spoke with [name]. appt scheduled. Nothing further was needed

## 2012-11-18 NOTE — Telephone Encounter (Signed)
Pt can come in on Wednesday 11-24-12 at 11:00am. Thanks.

## 2012-11-18 NOTE — Telephone Encounter (Signed)
Please advise if this pt can be added on to see CDY Tues or Wed next wk thanks! Pt last seen by [facility] 09/17/11

## 2012-11-19 ENCOUNTER — Telehealth: Payer: Self-pay | Admitting: Family Medicine

## 2012-11-19 NOTE — Telephone Encounter (Signed)
Patient advised and rx called to pharmacy

## 2012-11-19 NOTE — Telephone Encounter (Signed)
Can you please make sure he is not taking his mobic/meloxicam and no other NSAIDS. (for kidneys)  Also, his potassium is a little bit high. Normally not too big a deal, but with kidney function worsened, I would like to drop it a little.  Call in: Kayexalate 15 grams, 1 15 gram dose only, do not repeat.   [name], MD 11/19/2012, 9:05 AM

## 2012-11-24 ENCOUNTER — Ambulatory Visit (INDEPENDENT_AMBULATORY_CARE_PROVIDER_SITE_OTHER): Payer: Medicare Other | Admitting: Internal Medicine

## 2012-11-24 ENCOUNTER — Encounter: Payer: Self-pay | Admitting: Internal Medicine

## 2012-11-24 VITALS — BP 124/80 | HR 81 | Ht 69.5 in | Wt 161.0 lb

## 2012-11-24 DIAGNOSIS — C3490 Malignant neoplasm of unspecified part of unspecified bronchus or lung: Secondary | ICD-10-CM

## 2012-11-24 MED ORDER — IPRATROPIUM BROMIDE 0.03 % NA SOLN: NASAL | Status: DC

## 2012-11-24 NOTE — Progress Notes (Signed)
Patient ID: [name], male    DOB: May 17, 1951, 62 y.o.   MRN: [id number]  HPI  4 yoM former heavy smoker. Post hospital consult. S/P RML and RLL lobectomy Feb, 2012 at [facility]. He had one round of chemotherapy in early April, 2012, then admitted [facility] with chills, fever, productive cough, dyspnea and leukocytosis w/ pleural effusion. Thoracentesis and all cultures no growth. Treated empirically with broad antibiotics-Primaxin/Vanc. Gradually improved, but with persistent leukocytosis blamed on hx of splenectomy, Neupogen. Bronchoscopy 09/10/10- nonspecific inflammation. Recent f/u WBC coming down. Patient refuses to consider further chemotherapy.  Now little cough, white phlegm, no fever, no blood, some night sweats.  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  CXR 09/19/10 clearing Right pneumonia; stable nodule c/w scar, right effusion vs post op pleural thickening. CXR 2 weeks ago at [facility]   12/18/10- 26 yoM former heavy smoker, squamous cell lung Ca S/P RML and RLL lobectomy Feb, 2012 at [place], hx splenectomy, Rt pleural effusion, LUL lung nodule Wife here Completed chemotherapy Dr Earlie Server.  CT 12/10/10- growing spiculated cavitary nodule LUL, increased gas in moderate right partially loculated pleural effusion-? BP fistula, stable fine reticulonodular opacities- infection vs lymphangitic ca?? Images reviewed with them. Minor cough- thick colorless phlegm, no blood or pain, or fever. No change in routine sweating.  Lab from thoracentesis- 10/14/10- all cultures Neg. Prot 3.4; LDH 219; cytology neg.  Office spirometry 12/18/10- mod restriction. FEV1 22.36/66%; FVC 2.87/ 63%; FEV1/FVC 0.82   03/19/11-  45 yoM former heavy smoker, squamous cell lung Ca S/P RML and RLL lobectomy Feb, 2012 at [facility], hx splenectomy, Rt pleural effusion, LUL lung nodule.Marland KitchenMarland KitchenMarland KitchenMarland Kitchen wife is here For the past week he has had more cough with yellow sputum and some shortness of breath. It may be a cold. No energy. He denies pain in the  chest but has aches in his back and joints. Asks prescription for Robaxin and also for potassium that he was taking for muscle aches. He sees Dr. Mohammed/oncology for followup scan in November.  09/17/11-60 yoM former heavy smoker, squamous cell lung Ca S/P RML and RLL lobectomy Feb, 2012 at [place], hx splenectomy, Rt pleural effusion, LUL lung nodule.Marland KitchenMarland KitchenMarland KitchenMarland Kitchen wife is here Has good and bad days; pollen is causing him to have breathing issues at this time Did okay through the winter. No recurrence of his cancer based on visit with Dr. Earlie Server. COPD slows him down. Admits daily cough, usually not productive. Daily use of rescue inhaler. Followup chest CT is pending in August.  11/24/12- 62 yoM former heavy smoker, squamous cell lung Ca S/P RML and RLL lobectomy Feb, 2012 at [facility], LUL cavity w/ mycetoma, hx splenectomy, Rt pleural effusion, LUL lung nodule.   Wife here Follows for- coughing up blood, x couple weeks ago, ongoing 2-3 times for 3 days. no fever, yellowish and white production now, shortness of breath is baseline, some chest pain last night, improved today.  Approx 6/17 onset of incr cough, thick yellow, then blood o new process, and gave prednisone taper. He cleared quickly and is at baseline now w/o blood.  CT chest 11/12/12 IMPRESSION:  1. No acute cardiopulmonary abnormalities.  2. Similar appearance of peribronchovascular nodularity which is  likely secondary to chronic infectious bronchiolitis.  3. No significant change in size of left upper lobe cavitary  lesion containing mycetoma.  4. Chronic right fibrothorax.  Original Report Authenticated By: [name], M.D.  Review of Systems-See HPI Constitutional:   No-   weight loss, night  sweats, fevers, chills, fatigue, lassitude. HEENT:   No-  headaches, difficulty swallowing, tooth/dental problems, sore throat,       No-  sneezing, itching, ear ache, nasal congestion, post nasal drip,  CV:  No-  chest pain, orthopnea, PND, swelling  in lower extremities, anasarca,  dizziness, palpitations Resp: No-   shortness of breath with exertion or at rest.              +  productive cough,  + non-productive cough,  No- coughing up of blood.             No-change in color of mucus.  No- wheezing.   Skin: No-   rash or lesions. GI:  No-   heartburn, indigestion, abdominal pain, nausea, vomiting, GU:  MS:  +   joint pain or swelling.. Neuro-     nothing unusual Psych:  No- change in mood or affect. No depression or anxiety.  No memory loss.  Objective:   Physical Exam General- Alert, Oriented, Affect-appropriate, Distress- none acute Skin- rash-none, lesions- none, excoriation- none    + tatoos and scars Lymphadenopathy- none Head- atraumatic            Eyes- Gross vision intact, PERRLA, conjunctivae clear secretions            Ears- Hearing, canals            Nose- Clear, Septal dev, mucus, polyps, erosion, perforation             Throat- Mallampati II , mucosa clear , drainage- none, tonsils- atrophic ,+ dentures, +hoarse Neck- flexible , trachea midline, no stridor , thyroid nl, carotid no bruit Chest - symmetrical excursion , unlabored           Heart/CV- RRR , no murmur , no gallop  , no rub, nl s1 s2                           - JVD- none , edema- none, stasis changes- none, varices- none           Lung- clear to P&A, wheeze- none, cough-+dry ,  rub- none           Chest wall-  Abd-  Br/ Gen/ Rectal- Not done, not indicated Extrem- cyanosis- none, clubbing, none, atrophy- none, strength- nl Neuro- grossly intact to observation

## 2012-11-24 NOTE — Assessment & Plan Note (Signed)
New issue hemoptysis is common with mycetoma indicated in LUL by latest CT. High risk for cancer recurrence because of smoking hx and previous Ca, but nothing seen. Since bleeding has stopped after prednisone, it is likely benign.  Plan - No change, continue to observe.

## 2012-11-24 NOTE — Patient Instructions (Addendum)
Script sent for ipratropium nasal spray to stop the drip.  We can continue routine meds.   Please call as needed

## 2012-12-31 ENCOUNTER — Ambulatory Visit (HOSPITAL_COMMUNITY): Payer: Medicare Other

## 2012-12-31 ENCOUNTER — Other Ambulatory Visit: Payer: Medicare Other

## 2013-01-03 ENCOUNTER — Inpatient Hospital Stay: Payer: Medicare Other | Admitting: Internal Medicine

## 2013-01-03 ENCOUNTER — Other Ambulatory Visit: Payer: Self-pay | Admitting: Family Medicine

## 2013-01-03 NOTE — Telephone Encounter (Signed)
Px written for call in   

## 2013-01-03 NOTE — Telephone Encounter (Signed)
Electronic refill request, Dr. Lorelei Pont out of office this week, please advise

## 2013-01-03 NOTE — Telephone Encounter (Signed)
Rx called in as prescribed 

## 2013-01-04 ENCOUNTER — Other Ambulatory Visit: Payer: Self-pay | Admitting: Medical Oncology

## 2013-01-05 ENCOUNTER — Telehealth: Payer: Self-pay | Admitting: Internal Medicine

## 2013-01-05 NOTE — Telephone Encounter (Signed)
returned pt call and lvm for pt to call back the d/t to r/s appts.

## 2013-01-07 ENCOUNTER — Telehealth: Payer: Self-pay | Admitting: Medical Oncology

## 2013-01-07 NOTE — Telephone Encounter (Signed)
Message copied by Ardeen Garland on Fri Jan 07, 2013 10:49 AM ------      Message from: Curt Bears      Created: Tue Jan 04, 2013  8:26 PM       No need for another CT now.      ----- Message -----         From: Ardeen Garland, RN         Sent: 01/04/2013   4:33 PM           To: Curt Bears, MD            You ordered f/u labs, CT  in august - wife asking if he needs CT -he had one in June?       ------

## 2013-01-07 NOTE — Telephone Encounter (Signed)
Message left on pts home phone.

## 2013-01-10 ENCOUNTER — Telehealth: Payer: Self-pay | Admitting: Cardiology

## 2013-01-10 MED ORDER — NITROGLYCERIN 0.4 MG SL SUBL: 0.4000 mg | SUBLINGUAL_TABLET | SUBLINGUAL | Status: DC | PRN

## 2013-01-10 NOTE — Telephone Encounter (Signed)
Pt needs refill NITRO, [facility], pt out, pt# [phone]

## 2013-02-21 ENCOUNTER — Other Ambulatory Visit: Payer: Self-pay | Admitting: *Deleted

## 2013-02-21 NOTE — Telephone Encounter (Signed)
[place] database reviewed, no excessive scripts.   Please refill #60, 3 refills

## 2013-02-21 NOTE — Telephone Encounter (Signed)
Received a form faxed from the pharmacy stating that they had transferred this script to [place] and [place] states we can only transfer a control rx one time. Pharmacy request a new script. Last office visit 11/18/12.

## 2013-02-22 MED ORDER — CLONAZEPAM 0.5 MG PO TABS: ORAL_TABLET | ORAL | Status: DC

## 2013-02-22 NOTE — Telephone Encounter (Signed)
Called to [facility]. 

## 2013-03-15 ENCOUNTER — Ambulatory Visit (INDEPENDENT_AMBULATORY_CARE_PROVIDER_SITE_OTHER): Payer: Medicare Other

## 2013-03-15 DIAGNOSIS — Z23 Encounter for immunization: Secondary | ICD-10-CM

## 2013-03-30 ENCOUNTER — Other Ambulatory Visit: Payer: Self-pay | Admitting: Family Medicine

## 2013-03-30 NOTE — Telephone Encounter (Signed)
Last office visit 11/18/2012.  Don't see a Lipid since 2011.  Ok to refill?

## 2013-05-02 ENCOUNTER — Encounter: Payer: Self-pay | Admitting: Family Medicine

## 2013-05-02 ENCOUNTER — Ambulatory Visit (INDEPENDENT_AMBULATORY_CARE_PROVIDER_SITE_OTHER): Payer: Medicare Other | Admitting: Family Medicine

## 2013-05-02 VITALS — BP 102/68 | HR 66 | Temp 98.1°F | Ht 69.5 in | Wt 164.5 lb

## 2013-05-02 DIAGNOSIS — F909 Attention-deficit hyperactivity disorder, unspecified type: Secondary | ICD-10-CM | POA: Insufficient documentation

## 2013-05-02 DIAGNOSIS — R1013 Epigastric pain: Secondary | ICD-10-CM

## 2013-05-02 DIAGNOSIS — I4892 Unspecified atrial flutter: Secondary | ICD-10-CM | POA: Insufficient documentation

## 2013-05-02 DIAGNOSIS — I771 Stricture of artery: Secondary | ICD-10-CM | POA: Insufficient documentation

## 2013-05-02 DIAGNOSIS — K2270 Barrett's esophagus without dysplasia: Secondary | ICD-10-CM

## 2013-05-02 DIAGNOSIS — F17201 Nicotine dependence, unspecified, in remission: Secondary | ICD-10-CM | POA: Insufficient documentation

## 2013-05-02 DIAGNOSIS — Z9081 Acquired absence of spleen: Secondary | ICD-10-CM | POA: Insufficient documentation

## 2013-05-02 DIAGNOSIS — C3490 Malignant neoplasm of unspecified part of unspecified bronchus or lung: Secondary | ICD-10-CM

## 2013-05-02 DIAGNOSIS — I25810 Atherosclerosis of coronary artery bypass graft(s) without angina pectoris: Secondary | ICD-10-CM

## 2013-05-02 DIAGNOSIS — Z87891 Personal history of nicotine dependence: Secondary | ICD-10-CM

## 2013-05-02 DIAGNOSIS — N184 Chronic kidney disease, stage 4 (severe): Secondary | ICD-10-CM

## 2013-05-02 DIAGNOSIS — I739 Peripheral vascular disease, unspecified: Secondary | ICD-10-CM

## 2013-05-02 DIAGNOSIS — K222 Esophageal obstruction: Secondary | ICD-10-CM

## 2013-05-02 DIAGNOSIS — J449 Chronic obstructive pulmonary disease, unspecified: Secondary | ICD-10-CM

## 2013-05-02 DIAGNOSIS — Z9089 Acquired absence of other organs: Secondary | ICD-10-CM

## 2013-05-02 HISTORY — DX: Attention-deficit hyperactivity disorder, unspecified type: F90.9

## 2013-05-02 LAB — HEPATIC FUNCTION PANEL
ALT: 15 U/L (ref 0–53)
Alkaline Phosphatase: 102 U/L (ref 39–117)
Bilirubin, Direct: 0.1 mg/dL (ref 0.0–0.3)
Total Protein: 7.6 g/dL (ref 6.0–8.3)

## 2013-05-02 LAB — CBC WITH DIFFERENTIAL/PLATELET
Basophils Absolute: 0.1 10*3/uL (ref 0.0–0.1)
Lymphocytes Relative: 32.7 % (ref 12.0–46.0)
Lymphs Abs: 5.2 10*3/uL — ABNORMAL HIGH (ref 0.7–4.0)
Monocytes Relative: 10 % (ref 3.0–12.0)
Neutrophils Relative %: 54.9 % (ref 43.0–77.0)
Platelets: 225 10*3/uL (ref 150.0–400.0)
RDW: 14.7 % — ABNORMAL HIGH (ref 11.5–14.6)

## 2013-05-02 LAB — BASIC METABOLIC PANEL
BUN: 24 mg/dL — ABNORMAL HIGH (ref 6–23)
Calcium: 9.2 mg/dL (ref 8.4–10.5)
Creatinine, Ser: 1.8 mg/dL — ABNORMAL HIGH (ref 0.4–1.5)
GFR: 42.09 mL/min — ABNORMAL LOW (ref >60.00)
Glucose, Bld: 100 mg/dL — ABNORMAL HIGH (ref 70–99)

## 2013-05-02 NOTE — Patient Instructions (Signed)
Increase lansoprazole to twice a day right now  REFERRAL: [place], NEAR CHECK IN. ASK FOR MARION. SHE WILL HELP YOU SET UP YOUR REFERRAL. DATE: TIME:

## 2013-05-02 NOTE — Progress Notes (Signed)
Pre-visit discussion using our clinic review tool. No additional management support is needed unless otherwise documented below in the visit note.  
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
## 2013-05-09 ENCOUNTER — Other Ambulatory Visit: Payer: Self-pay | Admitting: *Deleted

## 2013-05-09 MED ORDER — LANSOPRAZOLE 30 MG PO CPDR: 30.0000 mg | DELAYED_RELEASE_CAPSULE | Freq: Every day | ORAL | Status: DC

## 2013-05-10 ENCOUNTER — Other Ambulatory Visit: Payer: Self-pay | Admitting: *Deleted

## 2013-05-10 ENCOUNTER — Telehealth: Payer: Self-pay | Admitting: Internal Medicine

## 2013-05-10 ENCOUNTER — Telehealth: Payer: Self-pay | Admitting: *Deleted

## 2013-05-10 ENCOUNTER — Ambulatory Visit: Payer: Medicare Other | Admitting: Internal Medicine

## 2013-05-10 DIAGNOSIS — C3490 Malignant neoplasm of unspecified part of unspecified bronchus or lung: Secondary | ICD-10-CM

## 2013-05-10 NOTE — Telephone Encounter (Signed)
returned pt call and sched pt for appt....pt aware and ok

## 2013-05-10 NOTE — Telephone Encounter (Signed)
Pt is due for CT chest and f/u.  Pt's wife concerned about kidney function and whether contrast would hurt his kidneys.  She said his kidney doctor wanted him to be admitted the night before the CT scan to get IVF to help boost his kidney function.  Per Dr Vista Mink, we do not do that, but we can order the CT w/o contrast.  Pt's wife concerned it would not show everything without contrast.  Dr Vista Mink said it would be find w/o,  Informed wife.  SLJ

## 2013-05-12 ENCOUNTER — Telehealth: Payer: Self-pay | Admitting: Internal Medicine

## 2013-05-12 NOTE — Telephone Encounter (Signed)
, °

## 2013-05-23 ENCOUNTER — Other Ambulatory Visit (HOSPITAL_BASED_OUTPATIENT_CLINIC_OR_DEPARTMENT_OTHER): Payer: Medicare Other

## 2013-05-23 ENCOUNTER — Ambulatory Visit (HOSPITAL_COMMUNITY)
Admission: RE | Admit: 2013-05-23 | Discharge: 2013-05-23 | Disposition: A | Discharge status: Home / Self Care | Payer: Medicare Other | Source: Ambulatory Visit | Attending: Internal Medicine | Admitting: Internal Medicine

## 2013-05-23 ENCOUNTER — Encounter (HOSPITAL_COMMUNITY): Payer: Self-pay

## 2013-05-23 ENCOUNTER — Telehealth: Payer: Self-pay | Admitting: Family Medicine

## 2013-05-23 DIAGNOSIS — C3490 Malignant neoplasm of unspecified part of unspecified bronchus or lung: Secondary | ICD-10-CM

## 2013-05-23 DIAGNOSIS — R091 Pleurisy: Secondary | ICD-10-CM | POA: Insufficient documentation

## 2013-05-23 DIAGNOSIS — J984 Other disorders of lung: Secondary | ICD-10-CM | POA: Insufficient documentation

## 2013-05-23 DIAGNOSIS — R05 Cough: Secondary | ICD-10-CM | POA: Insufficient documentation

## 2013-05-23 DIAGNOSIS — Z9089 Acquired absence of other organs: Secondary | ICD-10-CM | POA: Insufficient documentation

## 2013-05-23 DIAGNOSIS — Z951 Presence of aortocoronary bypass graft: Secondary | ICD-10-CM | POA: Insufficient documentation

## 2013-05-23 DIAGNOSIS — R042 Hemoptysis: Secondary | ICD-10-CM | POA: Insufficient documentation

## 2013-05-23 DIAGNOSIS — R059 Cough, unspecified: Secondary | ICD-10-CM | POA: Insufficient documentation

## 2013-05-23 DIAGNOSIS — R0602 Shortness of breath: Secondary | ICD-10-CM | POA: Insufficient documentation

## 2013-05-23 DIAGNOSIS — Z9221 Personal history of antineoplastic chemotherapy: Secondary | ICD-10-CM | POA: Insufficient documentation

## 2013-05-23 LAB — CBC WITH DIFFERENTIAL/PLATELET
BASO%: 0.5 % (ref 0.0–2.0)
EOS%: 0.9 % (ref 0.0–7.0)
HCT: 51.2 % — ABNORMAL HIGH (ref 38.4–49.9)
LYMPH%: 35.4 % (ref 14.0–49.0)
MCH: 30.6 pg (ref 27.2–33.4)
MCHC: 33 g/dL (ref 32.0–36.0)
NEUT%: 54 % (ref 39.0–75.0)
lymph#: 4.7 10*3/uL — ABNORMAL HIGH (ref 0.9–3.3)

## 2013-05-23 LAB — COMPREHENSIVE METABOLIC PANEL (CC13)
ALT: 11 U/L (ref 0–55)
Anion Gap: 11 mEq/L (ref 3–11)
CO2: 26 mEq/L (ref 22–29)
Chloride: 103 mEq/L (ref 98–109)
Potassium: 4.8 mEq/L (ref 3.5–5.1)
Sodium: 141 mEq/L (ref 136–145)
Total Bilirubin: 0.57 mg/dL (ref 0.20–1.20)
Total Protein: 7.7 g/dL (ref 6.4–8.3)

## 2013-05-23 NOTE — Telephone Encounter (Signed)
Pt wife called and request referral to ENT due to hearing loss. Please advise.

## 2013-05-24 ENCOUNTER — Ambulatory Visit (HOSPITAL_BASED_OUTPATIENT_CLINIC_OR_DEPARTMENT_OTHER): Payer: Medicare Other | Admitting: Internal Medicine

## 2013-05-24 ENCOUNTER — Encounter (INDEPENDENT_AMBULATORY_CARE_PROVIDER_SITE_OTHER): Payer: Self-pay

## 2013-05-24 ENCOUNTER — Telehealth: Payer: Self-pay | Admitting: Internal Medicine

## 2013-05-24 ENCOUNTER — Other Ambulatory Visit: Payer: Medicare Other

## 2013-05-24 ENCOUNTER — Encounter: Payer: Self-pay | Admitting: Internal Medicine

## 2013-05-24 VITALS — BP 126/100 | HR 76 | Temp 97.7°F | Resp 20 | Ht 69.5 in | Wt 161.3 lb

## 2013-05-24 DIAGNOSIS — C3490 Malignant neoplasm of unspecified part of unspecified bronchus or lung: Secondary | ICD-10-CM

## 2013-05-24 NOTE — Telephone Encounter (Signed)
I am confused. Who is "Michaela Corner"? I am fine with him going to ENT/Audiology, but I don't see referral?  Rosaria Ferries, do you mind helping me? Do I need to consult ENT?

## 2013-05-24 NOTE — Telephone Encounter (Signed)
Per CY-lets have patient come in tomorrow at 11:00am slot to be seen. Thanks.

## 2013-05-24 NOTE — Patient Instructions (Signed)
Referral to a pulmonologist for evaluation of the left lung cavitary lesion. Follow up visit in 6 months after repeating CT scan of the chest
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
## 2013-05-24 NOTE — Telephone Encounter (Signed)
Please advise Dr. Young thanks 

## 2013-05-24 NOTE — Telephone Encounter (Signed)
I called spoke with spouse. Appt scheduled for pt to come in tomorrow at 11 AM to see CDY. Nothing further needed

## 2013-05-24 NOTE — Telephone Encounter (Signed)
appts made and printed...td 

## 2013-05-25 ENCOUNTER — Telehealth: Payer: Self-pay | Admitting: Internal Medicine

## 2013-05-25 ENCOUNTER — Ambulatory Visit: Payer: Medicare Other | Admitting: Internal Medicine

## 2013-05-25 NOTE — Telephone Encounter (Signed)
Pt cx'd appt today with Dr Annamaria Boots. Rescheduled to 05/27/13 at 1030 with CY

## 2013-05-27 ENCOUNTER — Other Ambulatory Visit: Payer: Self-pay | Admitting: Family Medicine

## 2013-05-27 ENCOUNTER — Encounter: Payer: Self-pay | Admitting: Internal Medicine

## 2013-05-27 ENCOUNTER — Ambulatory Visit (INDEPENDENT_AMBULATORY_CARE_PROVIDER_SITE_OTHER): Payer: Medicare Other | Admitting: Internal Medicine

## 2013-05-27 VITALS — BP 114/64 | HR 58 | Ht 69.5 in | Wt 166.0 lb

## 2013-05-27 DIAGNOSIS — C3490 Malignant neoplasm of unspecified part of unspecified bronchus or lung: Secondary | ICD-10-CM
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
## 2013-05-27 NOTE — Patient Instructions (Signed)
Order- PET scan neck to thigh    Dx hx of lung cancer, cavitary mass left upper lobe  Based on the results of the PET scan, we will likely be discussing bronchoscopy

## 2013-06-03 ENCOUNTER — Encounter (HOSPITAL_COMMUNITY): Payer: Self-pay

## 2013-06-03 ENCOUNTER — Ambulatory Visit (HOSPITAL_COMMUNITY)
Admission: RE | Admit: 2013-06-03 | Discharge: 2013-06-03 | Disposition: A | Discharge status: Home / Self Care | Payer: Medicare Other | Source: Ambulatory Visit | Attending: Internal Medicine | Admitting: Internal Medicine

## 2013-06-03 ENCOUNTER — Telehealth: Payer: Self-pay | Admitting: Internal Medicine

## 2013-06-03 DIAGNOSIS — I7789 Other specified disorders of arteries and arterioles: Secondary | ICD-10-CM | POA: Insufficient documentation

## 2013-06-03 DIAGNOSIS — C3490 Malignant neoplasm of unspecified part of unspecified bronchus or lung: Secondary | ICD-10-CM

## 2013-06-03 DIAGNOSIS — J90 Pleural effusion, not elsewhere classified: Secondary | ICD-10-CM | POA: Insufficient documentation

## 2013-06-03 DIAGNOSIS — I77810 Thoracic aortic ectasia: Secondary | ICD-10-CM | POA: Insufficient documentation

## 2013-06-03 DIAGNOSIS — J984 Other disorders of lung: Secondary | ICD-10-CM | POA: Insufficient documentation

## 2013-06-03 LAB — GLUCOSE, CAPILLARY: GLUCOSE-CAPILLARY: 58 mg/dL — AB (ref 70–99)

## 2013-06-03 MED ORDER — FLUDEOXYGLUCOSE F - 18 (FDG) INJECTION
19.1000 | Freq: Once | INTRAVENOUS | Status: AC | PRN
  Administered 2013-06-03: 19.1 via INTRAVENOUS

## 2013-06-03 NOTE — Assessment & Plan Note (Signed)
Concerning cavitary process LUL slowly growing. Concern recurrent cancer vs new primary, possible mycetoma.  Plan- We discussed PET, then probable bronchoscopy. Need to make sure plans agreeable with Oncology.

## 2013-06-03 NOTE — Telephone Encounter (Signed)
I called him about his PET scan which shows non-specific activity in LUL cavitary nodule. As discussed at office, I recommended bronchoscopy to seek dx and he agrees. I will have our office arrange this.

## 2013-06-07 ENCOUNTER — Other Ambulatory Visit: Payer: Self-pay | Admitting: Pulmonary Disease

## 2013-06-07 DIAGNOSIS — C3490 Malignant neoplasm of unspecified part of unspecified bronchus or lung: Secondary | ICD-10-CM

## 2013-06-09 ENCOUNTER — Institutional Professional Consult (permissible substitution): Payer: Medicare Other | Admitting: Pulmonary Disease

## 2013-06-09 ENCOUNTER — Ambulatory Visit (INDEPENDENT_AMBULATORY_CARE_PROVIDER_SITE_OTHER)
Admission: RE | Admit: 2013-06-09 | Discharge: 2013-06-09 | Disposition: A | Discharge status: Home / Self Care | Payer: Medicare Other | Source: Ambulatory Visit | Attending: Pulmonary Disease | Admitting: Pulmonary Disease

## 2013-06-09 DIAGNOSIS — C3490 Malignant neoplasm of unspecified part of unspecified bronchus or lung: Secondary | ICD-10-CM

## 2013-06-10 ENCOUNTER — Telehealth: Payer: Self-pay | Admitting: Internal Medicine

## 2013-06-10 ENCOUNTER — Other Ambulatory Visit: Payer: Self-pay | Admitting: Pulmonary Disease

## 2013-06-10 DIAGNOSIS — R911 Solitary pulmonary nodule: Secondary | ICD-10-CM | POA: Insufficient documentation

## 2013-06-10 NOTE — Telephone Encounter (Signed)
Per phone note 06/03/13; Deneise Lever, MD at 06/03/2013 4:27 PM     Status: Signed        I called him about his PET scan which shows non-specific activity in LUL cavitary nodule. As discussed at office, I recommended bronchoscopy to seek dx and he agrees. I will have our office arrange this.  ---  I do not see anything order in pt chart for bronch. Dr. Annamaria Boots please advise if we are suppose to be setting this up for pt? thanks

## 2013-06-10 NOTE — Telephone Encounter (Signed)
Unless  Bleeding gets worse there is nothing specific to do.  Avoid aspirin products and avoid exertion, bending and lifting so it can seal off on its own. Continue asw planned for bronchoscopy. If bleeding gets heavy over weekend would need to go to ER.

## 2013-06-10 NOTE — Telephone Encounter (Signed)
We are on track thanks. Dr Gwenette Greet will be bronching him

## 2013-06-10 NOTE — Telephone Encounter (Signed)
I called and spoke with spouse. She is aware we will call once this gets set up.  She reports the pt has coughed up about a mouth full of bright red blood today. This only happened once today. Other days he will cough up very small amount of blood off and on during the day.  Please advise Dr. Annamaria Boots thanks  Allergies  Allergen Reactions  . Morphine And Related Other (See Comments)    Decreases respirations    Current Outpatient Prescriptions on File Prior to Visit  Medication Sig Dispense Refill  . albuterol (PROVENTIL) (2.5 MG/3ML) 0.083% nebulizer solution Take 2.5 mg by nebulization every 6 (six) hours as needed for wheezing or shortness of breath.      [name] albuterol (VENTOLIN HFA) 108 (90 BASE) MCG/ACT inhaler Inhale 2 puffs into the lungs every 6 (six) hours as needed for wheezing or shortness of breath.  18 g  prn  . amphetamine-dextroamphetamine (ADDERALL, 30MG ,) 30 MG tablet Take 30 mg by mouth 2 (two) times daily.        [name] aspirin 81 MG tablet Take 81 mg by mouth daily.        [name] atorvastatin (LIPITOR) 80 MG tablet TAKE ONE TABLET BY MOUTH EVERY NIGHT AT BEDTIME  90 tablet  0  . Cholecalciferol (VITAMIN D) 1000 UNITS capsule Take 1,000 Units by mouth daily.        . clonazePAM (KLONOPIN) 0.5 MG tablet Take 0.5 mg by mouth at bedtime. TAKE ONE TABLET BY MOUTH TWICE DAILY AS NEEDED      . dextromethorphan-guaiFENesin (MUCINEX DM) 30-600 MG per 12 hr tablet Take 1 tablet by mouth every 12 (twelve) hours.        . diclofenac sodium (VOLTAREN) 1 % GEL Apply 1 application topically 2 (two) times daily as needed.       . lansoprazole (PREVACID) 30 MG capsule Take 1 capsule (30 mg total) by mouth daily.  30 capsule  0  . methocarbamol (ROBAXIN) 500 MG tablet Take 500 mg by mouth every 6 (six) hours as needed (muscle spasms).      . metoprolol succinate (TOPROL-XL) 50 MG 24 hr tablet Take 50 mg by mouth at bedtime.      . metoprolol succinate (TOPROL-XL) 50 MG 24 hr tablet TAKE ONE (1) TABLET BY  MOUTH TWO (2) TIMES DAILY  180 tablet  0  . nitroGLYCERIN (NITROSTAT) 0.4 MG SL tablet Place 1 tablet (0.4 mg total) under the tongue every 5 (five) minutes as needed for chest pain.  25 tablet  3  . Oxycodone HCl (OXYCONTIN) 60 MG TB12 Take 1 tablet by mouth 2 (two) times daily.       [name] oxyCODONE-acetaminophen (PERCOCET) 10-325 MG per tablet Take 1 tablet by mouth every 6 (six) hours as needed for pain.        No current facility-administered medications on file prior to visit.

## 2013-06-10 NOTE — Telephone Encounter (Signed)
I called and made spouse aware of recs. She voiced her understanding.  She would like to know when [facility] plans on doing the bronch. Please advise Martin thanks  --aware will not get a call back until next week

## 2013-06-10 NOTE — Telephone Encounter (Signed)
Called and spoke with pt about ct and ENB.  His hemoptysis was small.  He understands to call us if gets worse or go to ER.  Will schedule for ENB.

## 2013-06-13 ENCOUNTER — Telehealth: Payer: Self-pay | Admitting: Internal Medicine

## 2013-06-13 NOTE — Telephone Encounter (Signed)
Spoke with the pt's spouse  She states that she was already called with appt for bronch  Nothing further needed

## 2013-06-16 ENCOUNTER — Encounter (HOSPITAL_COMMUNITY)
Admission: RE | Admit: 2013-06-16 | Discharge: 2013-06-16 | Disposition: A | Discharge status: Home / Self Care | Payer: Medicare Other | Source: Ambulatory Visit | Attending: Emergency Medicine | Admitting: Emergency Medicine

## 2013-06-16 ENCOUNTER — Encounter (HOSPITAL_COMMUNITY): Payer: Self-pay

## 2013-06-16 DIAGNOSIS — Z01812 Encounter for preprocedural laboratory examination: Secondary | ICD-10-CM | POA: Insufficient documentation

## 2013-06-16 DIAGNOSIS — Z01818 Encounter for other preprocedural examination: Secondary | ICD-10-CM | POA: Insufficient documentation

## 2013-06-16 DIAGNOSIS — Z01810 Encounter for preprocedural cardiovascular examination: Secondary | ICD-10-CM | POA: Insufficient documentation

## 2013-06-16 HISTORY — DX: Chronic kidney disease, unspecified: N18.9

## 2013-06-16 LAB — CBC
HCT: 49.3 % (ref 39.0–52.0)
HEMOGLOBIN: 16.7 g/dL (ref 13.0–17.0)
MCH: 31.5 pg (ref 26.0–34.0)
MCHC: 33.9 g/dL (ref 30.0–36.0)
MCV: 92.8 fL (ref 78.0–100.0)
Platelets: 276 10*3/uL (ref 150–400)
RBC: 5.31 MIL/uL (ref 4.22–5.81)
RDW: 14.3 % (ref 11.5–15.5)
WBC: 13.3 10*3/uL — ABNORMAL HIGH (ref 4.0–10.5)

## 2013-06-16 LAB — COMPREHENSIVE METABOLIC PANEL
ALK PHOS: 124 U/L — AB (ref 39–117)
ALT: 13 U/L (ref 0–53)
AST: 28 U/L (ref 0–37)
Albumin: 3.7 g/dL (ref 3.5–5.2)
BILIRUBIN TOTAL: 0.4 mg/dL (ref 0.3–1.2)
BUN: 26 mg/dL — AB (ref 6–23)
CO2: 27 mEq/L (ref 19–32)
CREATININE: 1.6 mg/dL — AB (ref 0.50–1.35)
Calcium: 9 mg/dL (ref 8.4–10.5)
Chloride: 100 mEq/L (ref 96–112)
GFR calc non Af Amer: 45 mL/min — ABNORMAL LOW (ref >90)
GFR, EST AFRICAN AMERICAN: 52 mL/min — AB (ref >90)
GLUCOSE: 131 mg/dL — AB (ref 70–99)
POTASSIUM: 5.1 meq/L (ref 3.7–5.3)
Sodium: 139 mEq/L (ref 137–147)
TOTAL PROTEIN: 7.4 g/dL (ref 6.0–8.3)

## 2013-06-16 LAB — APTT: aPTT: 29 seconds (ref 24–37)

## 2013-06-16 LAB — PROTIME-INR
INR: 0.95 (ref 0.00–1.49)
PROTHROMBIN TIME: 12.5 s (ref 11.6–15.2)

## 2013-06-16 NOTE — Pre-Procedure Instructions (Signed)
[name]  06/16/2013   Your procedure is scheduled on:  June 20, 2013  Report to [facility] Entrance A at 7:25 AM.  Call this number if you have problems the morning of surgery: [phone]   Remember:   Do not eat food or drink liquids after midnight.   Take these medicines the morning of surgery with A SIP OF WATER: albuterol (VENTOLIN HFA) as needed, clonazePAM (KLONOPIN) as needed, lansoprazole  (PREVACID),methocarbamol (ROBAXIN) a needed for spasm, metoprolol succinate(Toprol),  Pain pill as needed, amphetamine-dextroamphetamine (ADDERALL)    Do not wear jewelry.  Do not wear lotions, powders, or colognes. You may wear deodorant.  Men may shave face and neck only.  Do not bring valuables to the hospital.  [facility] is not responsible  for any belongings or valuables.               Contacts, dentures or bridgework may not be worn into surgery.  Leave suitcase in the car. After surgery it may be brought to your room.  For patients admitted to the hospital, discharge time is determined by your                treatment team.               Patients discharged the day of surgery will not be allowed to drive  home.  Name and phone number of your driver:   Special Instructions: Shower using CHG 2 nights before surgery and the night before surgery.  If you shower the day of surgery use CHG.  Use special wash - you have one bottle of CHG for all showers.  You should use approximately 1/3 of the bottle for each shower.   Please read over the following fact sheets that you were given: Pain Booklet, Coughing and Deep Breathing and Surgical Site Infection Prevention
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
## 2013-06-20 ENCOUNTER — Encounter (HOSPITAL_COMMUNITY): Payer: Self-pay | Admitting: Critical Care Medicine

## 2013-06-20 ENCOUNTER — Encounter (HOSPITAL_COMMUNITY)
Admission: RE | Disposition: A | Discharge status: Home / Self Care | Payer: Self-pay | Source: Ambulatory Visit | Attending: Emergency Medicine

## 2013-06-20 ENCOUNTER — Ambulatory Visit (HOSPITAL_COMMUNITY): Payer: Medicare Other

## 2013-06-20 ENCOUNTER — Ambulatory Visit (HOSPITAL_COMMUNITY)
Admission: RE | Admit: 2013-06-20 | Discharge: 2013-06-20 | Disposition: A | Discharge status: Home / Self Care | Payer: Medicare Other | Source: Ambulatory Visit | Attending: Emergency Medicine | Admitting: Emergency Medicine

## 2013-06-20 ENCOUNTER — Encounter (HOSPITAL_COMMUNITY): Payer: Medicare Other | Admitting: Vascular Surgery

## 2013-06-20 ENCOUNTER — Telehealth: Payer: Self-pay

## 2013-06-20 ENCOUNTER — Ambulatory Visit (HOSPITAL_COMMUNITY): Payer: Medicare Other | Admitting: Critical Care Medicine

## 2013-06-20 DIAGNOSIS — F411 Generalized anxiety disorder: Secondary | ICD-10-CM | POA: Insufficient documentation

## 2013-06-20 DIAGNOSIS — Z87891 Personal history of nicotine dependence: Secondary | ICD-10-CM | POA: Insufficient documentation

## 2013-06-20 DIAGNOSIS — K449 Diaphragmatic hernia without obstruction or gangrene: Secondary | ICD-10-CM | POA: Insufficient documentation

## 2013-06-20 DIAGNOSIS — I739 Peripheral vascular disease, unspecified: Secondary | ICD-10-CM | POA: Insufficient documentation

## 2013-06-20 DIAGNOSIS — Z951 Presence of aortocoronary bypass graft: Secondary | ICD-10-CM | POA: Insufficient documentation

## 2013-06-20 DIAGNOSIS — I2510 Atherosclerotic heart disease of native coronary artery without angina pectoris: Secondary | ICD-10-CM | POA: Insufficient documentation

## 2013-06-20 DIAGNOSIS — K219 Gastro-esophageal reflux disease without esophagitis: Secondary | ICD-10-CM | POA: Insufficient documentation

## 2013-06-20 DIAGNOSIS — J449 Chronic obstructive pulmonary disease, unspecified: Secondary | ICD-10-CM | POA: Insufficient documentation

## 2013-06-20 DIAGNOSIS — R911 Solitary pulmonary nodule: Secondary | ICD-10-CM | POA: Diagnosis present

## 2013-06-20 DIAGNOSIS — I10 Essential (primary) hypertension: Secondary | ICD-10-CM | POA: Insufficient documentation

## 2013-06-20 DIAGNOSIS — I4891 Unspecified atrial fibrillation: Secondary | ICD-10-CM | POA: Insufficient documentation

## 2013-06-20 DIAGNOSIS — J4489 Other specified chronic obstructive pulmonary disease: Secondary | ICD-10-CM | POA: Insufficient documentation

## 2013-06-20 HISTORY — PX: VIDEO BRONCHOSCOPY WITH ENDOBRONCHIAL NAVIGATION: SHX6175

## 2013-06-20 SURGERY — VIDEO BRONCHOSCOPY WITH ENDOBRONCHIAL NAVIGATION
Anesthesia: General

## 2013-06-20 MED ORDER — PHENYLEPHRINE HCL 10 MG/ML IJ SOLN
INTRAMUSCULAR | Status: DC | PRN
  Administered 2013-06-20: 40 ug via INTRAVENOUS
  Administered 2013-06-20: 80 ug via INTRAVENOUS

## 2013-06-20 MED ORDER — SODIUM CHLORIDE 0.9 % IV SOLN
INTRAVENOUS | Status: DC
  Administered 2013-06-20: 10 mL/h via INTRAVENOUS

## 2013-06-20 MED ORDER — HYDROMORPHONE HCL PF 1 MG/ML IJ SOLN: 0.2500 mg | INTRAMUSCULAR | Status: DC | PRN

## 2013-06-20 MED ORDER — ARTIFICIAL TEARS OP OINT
TOPICAL_OINTMENT | OPHTHALMIC | Status: DC | PRN
  Administered 2013-06-20: 1 via OPHTHALMIC

## 2013-06-20 MED ORDER — ONDANSETRON HCL 4 MG/2ML IJ SOLN
INTRAMUSCULAR | Status: DC | PRN
  Administered 2013-06-20: 4 mg via INTRAVENOUS

## 2013-06-20 MED ORDER — MIDAZOLAM HCL 5 MG/5ML IJ SOLN
INTRAMUSCULAR | Status: DC | PRN
  Administered 2013-06-20: 2 mg via INTRAVENOUS

## 2013-06-20 MED ORDER — ROCURONIUM BROMIDE 100 MG/10ML IV SOLN
INTRAVENOUS | Status: DC | PRN
  Administered 2013-06-20: 40 mg via INTRAVENOUS

## 2013-06-20 MED ORDER — LIDOCAINE HCL (CARDIAC) 20 MG/ML IV SOLN
INTRAVENOUS | Status: DC | PRN
  Administered 2013-06-20: 60 mg via INTRAVENOUS

## 2013-06-20 MED ORDER — GLYCOPYRROLATE 0.2 MG/ML IJ SOLN
INTRAMUSCULAR | Status: DC | PRN
  Administered 2013-06-20: 0.6 mg via INTRAVENOUS

## 2013-06-20 MED ORDER — 0.9 % SODIUM CHLORIDE (POUR BTL) OPTIME
TOPICAL | Status: DC | PRN
  Administered 2013-06-20: 1000 mL

## 2013-06-20 MED ORDER — ONDANSETRON HCL 4 MG/2ML IJ SOLN
INTRAMUSCULAR | Status: AC
  Filled 2013-06-20: qty 2

## 2013-06-20 MED ORDER — ONDANSETRON HCL 4 MG/2ML IJ SOLN: 4.0000 mg | Freq: Once | INTRAMUSCULAR | Status: DC | PRN

## 2013-06-20 MED ORDER — EPHEDRINE SULFATE 50 MG/ML IJ SOLN
INTRAMUSCULAR | Status: AC
  Filled 2013-06-20: qty 1

## 2013-06-20 MED ORDER — GLYCOPYRROLATE 0.2 MG/ML IJ SOLN
INTRAMUSCULAR | Status: AC
  Filled 2013-06-20: qty 3

## 2013-06-20 MED ORDER — FENTANYL CITRATE 0.05 MG/ML IJ SOLN
INTRAMUSCULAR | Status: DC | PRN
  Administered 2013-06-20 (×2): 50 ug via INTRAVENOUS

## 2013-06-20 MED ORDER — MIDAZOLAM HCL 2 MG/2ML IJ SOLN
INTRAMUSCULAR | Status: AC
  Filled 2013-06-20: qty 2

## 2013-06-20 MED ORDER — NEOSTIGMINE METHYLSULFATE 1 MG/ML IJ SOLN
INTRAMUSCULAR | Status: DC | PRN
  Administered 2013-06-20: 4 mg via INTRAVENOUS

## 2013-06-20 MED ORDER — ONDANSETRON HCL 4 MG/2ML IJ SOLN: INTRAMUSCULAR | Status: DC | PRN

## 2013-06-20 MED ORDER — ARTIFICIAL TEARS OP OINT
TOPICAL_OINTMENT | OPHTHALMIC | Status: AC
  Filled 2013-06-20: qty 3.5

## 2013-06-20 MED ORDER — SODIUM CHLORIDE 0.9 % IJ SOLN
INTRAMUSCULAR | Status: AC
  Filled 2013-06-20: qty 10

## 2013-06-20 MED ORDER — PROPOFOL 10 MG/ML IV BOLUS
INTRAVENOUS | Status: DC | PRN
  Administered 2013-06-20: 140 mg via INTRAVENOUS

## 2013-06-20 MED ORDER — NEOSTIGMINE METHYLSULFATE 1 MG/ML IJ SOLN
INTRAMUSCULAR | Status: AC
  Filled 2013-06-20: qty 10

## 2013-06-20 MED ORDER — PROPOFOL 10 MG/ML IV BOLUS
INTRAVENOUS | Status: AC
  Filled 2013-06-20: qty 20

## 2013-06-20 MED ORDER — PHENYLEPHRINE 40 MCG/ML (10ML) SYRINGE FOR IV PUSH (FOR BLOOD PRESSURE SUPPORT)
PREFILLED_SYRINGE | INTRAVENOUS | Status: AC
  Filled 2013-06-20: qty 10

## 2013-06-20 MED ORDER — OXYCODONE HCL 5 MG/5ML PO SOLN: 5.0000 mg | Freq: Once | ORAL | Status: DC | PRN

## 2013-06-20 MED ORDER — OXYCODONE HCL 5 MG PO TABS: 5.0000 mg | ORAL_TABLET | Freq: Once | ORAL | Status: DC | PRN

## 2013-06-20 MED ORDER — FENTANYL CITRATE 0.05 MG/ML IJ SOLN
INTRAMUSCULAR | Status: AC
  Filled 2013-06-20: qty 5

## 2013-06-20 SURGICAL SUPPLY — 35 items
BRUSH CYTOL CELLEBRITY 1.5X140 (MISCELLANEOUS) ×2 IMPLANT
BRUSH SUPERTRAX BIOPSY (INSTRUMENTS) ×1 IMPLANT
BRUSH SUPERTRAX NDL-TIP CYTO (INSTRUMENTS) ×1 IMPLANT
CANISTER SUCTION 2500CC (MISCELLANEOUS) ×2 IMPLANT
CHANNEL WORK EXTEND EDGE 180 (KITS) IMPLANT
CHANNEL WORK EXTEND EDGE 45 (KITS) IMPLANT
CHANNEL WORK EXTEND EDGE 90 (KITS) IMPLANT
CLOTH BEACON ORANGE TIMEOUT ST (SAFETY) ×2 IMPLANT
CONT SPEC 4OZ CLIKSEAL STRL BL (MISCELLANEOUS) ×3 IMPLANT
COVER TABLE BACK 60X90 (DRAPES) ×2 IMPLANT
FILTER STRAW FLUID ASPIR (MISCELLANEOUS) IMPLANT
FORCEPS BIOP SUPERTRX PREMAR (INSTRUMENTS) ×1 IMPLANT
GLOVE BIOGEL M STRL SZ7.5 (GLOVE) ×4 IMPLANT
GLOVE BIOGEL PI IND STRL 6.5 (GLOVE) IMPLANT
GLOVE BIOGEL PI INDICATOR 6.5 (GLOVE) ×2
KIT LOCATABLE GUIDE (CANNULA) IMPLANT
KIT MARKER FIDUCIAL DELIVERY (KITS) IMPLANT
KIT PROCEDURE EDGE 180 (KITS) IMPLANT
KIT PROCEDURE EDGE 45 (KITS) IMPLANT
KIT PROCEDURE EDGE 90 (KITS) ×1 IMPLANT
KIT ROOM TURNOVER OR (KITS) ×2 IMPLANT
MARKER SKIN DUAL TIP RULER LAB (MISCELLANEOUS) ×2 IMPLANT
NDL SUPERTRX PREMARK BIOPSY (NEEDLE) IMPLANT
NEEDLE SUPERTRX PREMARK BIOPSY (NEEDLE) ×2 IMPLANT
NS IRRIG 1000ML POUR BTL (IV SOLUTION) ×2 IMPLANT
OIL SILICONE PENTAX (PARTS (SERVICE/REPAIRS)) ×2 IMPLANT
PAD ARMBOARD 7.5X6 YLW CONV (MISCELLANEOUS) ×4 IMPLANT
PATCHES PATIENT (LABEL) ×2 IMPLANT
SPONGE GAUZE 4X4 12PLY (GAUZE/BANDAGES/DRESSINGS) ×2 IMPLANT
SYR 20CC LL (SYRINGE) ×2 IMPLANT
SYR 20ML ECCENTRIC (SYRINGE) ×2 IMPLANT
SYR 50ML SLIP (SYRINGE) ×2 IMPLANT
TOWEL OR 17X24 6PK STRL BLUE (TOWEL DISPOSABLE) ×2 IMPLANT
TRAP SPECIMEN MUCOUS 40CC (MISCELLANEOUS) ×2 IMPLANT
TUBE CONNECTING 12X1/4 (SUCTIONS) ×2 IMPLANT

## 2013-06-20 NOTE — Discharge Instructions (Signed)
Flexible Bronchoscopy, Care After These instructions give you information on caring for yourself after your procedure. Your doctor may also give you more specific instructions. Call your doctor if you have any problems or questions after your procedure. HOME CARE  Do not eat or drink anything for 2 hours after your procedure. If you try to eat or drink before the medicine wears off, food or drink could go into your lungs. You could also burn yourself.  After 2 hours have passed and when you can cough and gag normally, you may eat soft food and drink liquids slowly.  The day after the test, you may eat your normal diet.  You may do your normal activities.  Keep all doctor visits. GET HELP RIGHT AWAY IF:  You get more and more short of breath.  You get lightheaded.  You feel like you are going to pass out (faint).  You have chest pain.  You have new problems that worry you.  You cough up more than a little blood.  You cough up more blood than before. MAKE SURE YOU:  Understand these instructions.  Will watch your condition.  Will get help right away if you are not doing well or get worse. Document Released: 03/09/2009 Document Revised: 03/02/2013 Document Reviewed: 01/14/2013 [facility] Patient Information 2014 [place].  Please call our office for any questions or concerns. [phone].   What to eat:  For your first meals, you should eat lightly; only small meals initially.  If you do not have nausea, you may eat larger meals.  Avoid spicy, greasy and heavy food.    General Anesthesia, Adult, Care After  Refer to this sheet in the next few weeks. These instructions provide you with information on caring for yourself after your procedure. Your health care provider may also give you more specific instructions. Your treatment has been planned according to current medical practices, but problems sometimes occur. Call your health care provider if you have any problems or  questions after your procedure.  WHAT TO EXPECT AFTER THE PROCEDURE  After the procedure, it is typical to experience:  Sleepiness.  Nausea and vomiting. HOME CARE INSTRUCTIONS  For the first 24 hours after general anesthesia:  Have a responsible person with you.  Do not drive a car. If you are alone, do not take public transportation.  Do not drink alcohol.  Do not take medicine that has not been prescribed by your health care provider.  Do not sign important papers or make important decisions.  You may resume a normal diet and activities as directed by your health care provider.  Change bandages (dressings) as directed.  If you have questions or problems that seem related to general anesthesia, call the hospital and ask for the anesthetist or anesthesiologist on call. SEEK MEDICAL CARE IF:  You have nausea and vomiting that continue the day after anesthesia.  You develop a rash. SEEK IMMEDIATE MEDICAL CARE IF:  You have difficulty breathing.  You have chest pain.  You have any allergic problems. Document Released: 08/18/2000 Document Revised: 01/12/2013 Document Reviewed: 11/25/2012  [facility] Patient Information 2014 [place], [place].

## 2013-06-20 NOTE — Preoperative (Signed)
Beta Blockers   Reason not to administer Beta Blockers:Not Applicable, pt took 0/24/09

## 2013-06-20 NOTE — Progress Notes (Signed)
Spoke with Dr. Lamonte Sakai and he stated consent should be bronchoscopy with electromagnetic navigation bronchoscopy and biopsies

## 2013-06-20 NOTE — Anesthesia Preprocedure Evaluation (Addendum)
Anesthesia Evaluation  Patient identified by MRN, date of birth, ID band Patient awake    Reviewed: Allergy & Precautions, H&P , NPO status , Patient's Chart, lab work & pertinent test results, reviewed documented beta blocker date and time   Airway Mallampati: II TM Distance: >3 FB Neck ROM: Full    Dental  (+) Dental Advisory Given, Edentulous Upper and Poor Dentition   Pulmonary COPDformer smoker,  breath sounds clear to auscultation        Cardiovascular hypertension, Pt. on home beta blockers + CAD, + CABG and + Peripheral Vascular Disease + dysrhythmias Atrial Fibrillation Rhythm:Regular Rate:Normal     Neuro/Psych PSYCHIATRIC DISORDERS Anxiety    GI/Hepatic hiatal hernia, GERD-  Medicated,  Endo/Other    Renal/GU Renal InsufficiencyRenal disease     Musculoskeletal   Abdominal   Peds  Hematology   Anesthesia Other Findings   Reproductive/Obstetrics                         Anesthesia Physical Anesthesia Plan  ASA: III  Anesthesia Plan: General   Post-op Pain Management:    Induction: Intravenous  Airway Management Planned: Oral ETT  Additional Equipment:   Intra-op Plan:   Post-operative Plan: Extubation in OR  Informed Consent: I have reviewed the patients History and Physical, chart, labs and discussed the procedure including the risks, benefits and alternatives for the proposed anesthesia with the patient or authorized representative who has indicated his/her understanding and acceptance.   Dental advisory given  Plan Discussed with: Anesthesiologist, Surgeon and CRNA  Anesthesia Plan Comments:        Anesthesia Quick Evaluation

## 2013-06-20 NOTE — Interval H&P Note (Signed)
PCCM interval note  S:  Pt feeling well, no new issues reported. Hx lung CA with RML and LL lobectomies, now with slowly enlarging LUL cavitary lesion, hypermetabolic on PET and worrisome for recurrence or new primary.   Filed Vitals:   06/20/13 0809 06/20/13 0831  BP: 98/67   Pulse: 55   Temp: 98 F (36.7 C)   TempSrc: Oral   Resp: 18   Height:  5\' 10"  (1.778 m)  Weight:  72.3 kg (159 lb 6.3 oz)  SpO2: 99%     Recent Labs Lab 06/16/13 1524  HGB 16.7  HCT 49.3  WBC 13.3*  PLT 276    Recent Labs Lab 06/16/13 1524  INR 0.95    Recent Labs Lab 06/16/13 1524  NA 139  K 5.1  CL 100  CO2 27  GLUCOSE 131*  BUN 26*  CREATININE 1.60*  CALCIUM 9.0   Plans:  No barriers to proceeding with FOB + electromagnetic navigation for LUL biopsies. Pt understands the procedure, risks, benefits and agree with the plan.   Baltazar Apo, MD, PhD 06/20/2013, 9:29 AM [facility] [phone] or if no answer [phone]

## 2013-06-20 NOTE — Anesthesia Postprocedure Evaluation (Signed)
  Anesthesia Post-op Note  Patient: [name]  Procedure(s) Performed: Procedure(s): VIDEO BRONCHOSCOPY WITH ENDOBRONCHIAL NAVIGATION (N/A)  Patient Location: PACU  Anesthesia Type:General  Level of Consciousness: awake, alert  and oriented  Airway and Oxygen Therapy: Patient Spontanous Breathing and Patient connected to nasal cannula oxygen  Post-op Pain: mild  Post-op Assessment: Post-op Vital signs reviewed, Patient's Cardiovascular Status Stable, Respiratory Function Stable, Patent Airway, No signs of Nausea or vomiting and Pain level controlled  Post-op Vital Signs: stable  Complications: No apparent anesthesia complications
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
## 2013-06-20 NOTE — Transfer of Care (Signed)
Immediate Anesthesia Transfer of Care Note  Patient: [name]  Procedure(s) Performed: Procedure(s): VIDEO BRONCHOSCOPY WITH ENDOBRONCHIAL NAVIGATION (N/A)  Patient Location: PACU  Anesthesia Type:General  Level of Consciousness: awake, alert  and oriented  Airway & Oxygen Therapy: Patient connected to nasal cannula oxygen  Post-op Assessment: Report given to PACU RN  Post vital signs: stable  Complications: No apparent anesthesia complications

## 2013-06-20 NOTE — Telephone Encounter (Signed)
Record Num: [id number] Operator: Genice Rouge Patient Name: [name] Call Date & Time: 06/18/2013 10:57:43AM Patient Phone: [phone] PCP: Ria Bush Patient Gender: Male PCP Fax : [phone] Patient DOB: 06/17/1950 Practice Name: [facility] Reason for Call: Caller: [name]/Spouse; PCP: Other; CB#: [phone]; Call regarding Medication Issue; Medication(s): albuterol Holly is supposed to have a lung biopsy done on Monday and was instructed to bring his Albuterol Inhaler with him. Is currently out of medication and needing a refill. Denies need for triage. Noted script in EPIC from Dr. Lorelei Pont for Albuterol Inhaler 2 puffs q 6 hrs prn for wheezing/shortness of breath written on 05/28/12 "refill as needed". Per standing orders, called in 1 Albuterol Inhaler with no refills to [facility] [phone]. Protocol(s) Used: Office Note Recommended Outcome per Protocol: Information Noted and Sent to Office Reason for Outcome: Caller information to office Care Advice: ~ 01/

## 2013-06-20 NOTE — Op Note (Signed)
Video Bronchoscopy with Electromagnetic Navigation Procedure Note  Date of Operation: 06/20/2013  Pre-op Diagnosis: left upper lobe cavitary nodule  Post-op Diagnosis: same  Surgeon: Baltazar Apo  Assistants: none  Anesthesia: General endotracheal anesthesia  Operation: Flexible video fiberoptic bronchoscopy with electromagnetic navigation and biopsies.  Estimated Blood Loss: Minimal  Complications: none apparent  Indications and History: [name] is a 63 y.o. male with history of squamous cell lung cancer status post right middle lobe and lower lobe lobectomies in 2012. He has been followed for a slowly enlarging left upper lobe cavitary nodule. The lesion was hypermetabolic on PET scan and decision was made to pursue tissue diagnosis by bronchoscopy with electromagnetic navigation.  The risks, benefits, complications, treatment options and expected outcomes were discussed with the patient.  The possibilities of pneumothorax, pneumonia, reaction to medication, pulmonary aspiration, perforation of a viscus, bleeding, failure to diagnose a condition and creating a complication requiring transfusion or operation were discussed with the patient who freely signed the consent.    Description of Procedure: The patient was seen in the Preoperative Area, was examined and was deemed appropriate to proceed.  The patient was taken to [facility] OR 10, identified as [name] and the procedure verified as Flexible Video Fiberoptic Bronchoscopy.  A Time Out was held and the above information confirmed.   Prior to the date of the procedure a high-resolution CT scan of the chest was performed. Utilizing [place] a virtual tracheobronchial tree was generated to allow the creation of distinct navigation pathways to the patient's parenchymal abnormalities. After being taken to the operating room general anesthesia was initiated and the patient  was orally intubated. The video  fiberoptic bronchoscope was introduced via the endotracheal tube and a general inspection was performed which showed a well-healed suture line in the bronchus intermedius without any obvious endobronchial lesions. Inspection of the other airways showed normal right upper lobe, left upper lobe and left lower lobe airways. There were no abnormal secretions. The extendable working channel and locator guide were introduced into the bronchoscope. The distinct navigation pathways prepared prior to this procedure were then utilized to navigate to within 0.9 cm of patient's left upper lobe lesion identified on CT scan. The extendable working channel was secured into place and the locator guide was withdrawn. Under fluoroscopic guidance 2 pathway approaches were used to obtain transbronchial needle brushings, transbronchial Wang needle biopsies, and transbronchial forceps biopsies. These  Were sent for AFB and fungal staining, cytology and pathology. A bronchioalveolar lavage was performed in the LUL and sent for cytology and microbiology (bacterial, fungal, AFB smears and cultures). At the end of the procedure a general airway inspection was performed and there was no evidence of active bleeding. The bronchoscope was removed.  The patient tolerated the procedure well. There was no significant blood loss and there were no obvious complications. A post-procedural chest x-ray is pending.  Samples: 1. Transbronchial needle brushings from left upper lobe 2. Transbronchial Wang needle biopsies from left upper lobe 3. Transbronchial forceps biopsies from left upper lobe 4. Bronchoalveolar lavage from left upper lobe  Plans:  The patient will be discharged from the PACU to home when recovered from anesthesia and after chest x-ray is reviewed. We will review the cytology, pathology and microbiology results with the patient when they become available. Outpatient followup will be with Dr Annamaria Boots and Dr Lamonte Sakai.   Baltazar Apo,  MD, PhD 06/20/2013, 11:05 AM [facility] [phone] or if no  answer [phone]

## 2013-06-20 NOTE — Anesthesia Procedure Notes (Signed)
Procedure Name: Intubation Date/Time: 06/20/2013 9:40 AM Performed by: Carola Frost Pre-anesthesia Checklist: Patient identified, Timeout performed, Emergency Drugs available, Patient being monitored and Suction available Patient Re-evaluated:Patient Re-evaluated prior to inductionOxygen Delivery Method: Circle system utilized Preoxygenation: Pre-oxygenation with 100% oxygen Intubation Type: IV induction Ventilation: Mask ventilation without difficulty and Oral airway inserted - appropriate to patient size Laryngoscope Size: Mac and 4 Grade View: Grade I Tube type: Oral Tube size: 8.5 mm Number of attempts: 1 Airway Equipment and Method: Stylet Placement Confirmation: positive ETCO2 and breath sounds checked- equal and bilateral Secured at: 23 cm Tube secured with: Tape Dental Injury: Teeth and Oropharynx as per pre-operative assessment

## 2013-06-21 ENCOUNTER — Encounter (HOSPITAL_COMMUNITY): Payer: Self-pay | Admitting: Emergency Medicine

## 2013-06-21 LAB — FUNGAL STAIN

## 2013-06-21 LAB — AFB STAIN

## 2013-06-22 ENCOUNTER — Telehealth: Payer: Self-pay | Admitting: Internal Medicine

## 2013-06-22 LAB — CULTURE, RESPIRATORY

## 2013-06-22 LAB — CULTURE, RESPIRATORY W GRAM STAIN

## 2013-06-22 NOTE — Telephone Encounter (Signed)
Spoke with the pt's spouse  She states that [name] advised would call today with bronch results  I advised will forward him the msg to [place] for recs  Please advise thanks!

## 2013-06-22 NOTE — Telephone Encounter (Signed)
Bx results - shows abundant fungal elements, aspergillus. Also some areas of squamous metaplasia. Suspect this relates to the fungus but must recall that he had squamous cell lung CA. Will discuss w Dr Annamaria Boots, will likely treat with antifungals and re-image.

## 2013-06-23 ENCOUNTER — Telehealth: Payer: Self-pay | Admitting: Internal Medicine

## 2013-06-23 DIAGNOSIS — B449 Aspergillosis, unspecified: Secondary | ICD-10-CM

## 2013-06-23 DIAGNOSIS — J449 Chronic obstructive pulmonary disease, unspecified: Secondary | ICD-10-CM

## 2013-06-23 NOTE — Telephone Encounter (Signed)
It would be best to keep the current pending appointment. Meanwhile would like to start some blood work: Order lab                     For dx chronic pulmonary aspergillosis Mold profile 6411 Aspergillus antibody [id number]                                       cpt [id number] Aspergillus Antigen   [id number]                                             87305 Aspergillus fumigatus antibodies, CF  [id number]                  78938 Total IgE

## 2013-06-23 NOTE — Telephone Encounter (Signed)
Pt was given results of Bronch from Dr Lamonte Sakai.  PT would like to know what next step is for treatment of ? Fungus.  Please advise.

## 2013-06-24 NOTE — Telephone Encounter (Signed)
Called home # and received message I have reached a # that has been d/c or no longer in service. Called mobile # [email or web address] x1 for pt

## 2013-06-24 NOTE — Telephone Encounter (Signed)
Spouse called back and aware of recs. Order has bene placed with [facility] help and choosing dx. Nothing further needed

## 2013-06-27 ENCOUNTER — Ambulatory Visit: Payer: Medicare Other

## 2013-06-27 ENCOUNTER — Telehealth: Payer: Self-pay | Admitting: Emergency Medicine

## 2013-06-27 DIAGNOSIS — J449 Chronic obstructive pulmonary disease, unspecified: Secondary | ICD-10-CM

## 2013-06-27 DIAGNOSIS — B449 Aspergillosis, unspecified: Secondary | ICD-10-CM

## 2013-06-27 DIAGNOSIS — R918 Other nonspecific abnormal finding of lung field: Secondary | ICD-10-CM

## 2013-06-27 NOTE — Telephone Encounter (Signed)
Spoke to Ms [name] about dx of squamous cell CA (the patient has MR and I did not believe he would fully grasp the medical issues here). I will refer him to [place] to talk about the options. His guardian and [name] will accompany him. Please set an [place] visit for him.

## 2013-06-28 ENCOUNTER — Other Ambulatory Visit: Payer: Self-pay | Admitting: *Deleted

## 2013-06-28 ENCOUNTER — Telehealth: Payer: Self-pay | Admitting: *Deleted

## 2013-06-28 LAB — MOLD PROFILE
Alternaria Alternata: <0.1 kU/L
Aspergillus fumigatus, m3: <0.1 kU/L
Helminthosporium halodes: <0.1 kU/L

## 2013-06-28 LAB — IGE: IGE (IMMUNOGLOBULIN E), SERUM: 9 [IU]/mL (ref 0.0–180.0)

## 2013-06-28 MED ORDER — LANSOPRAZOLE 30 MG PO CPDR: 30.0000 mg | DELAYED_RELEASE_CAPSULE | Freq: Every day | ORAL | Status: DC

## 2013-06-28 NOTE — Telephone Encounter (Signed)
Called spoke with pt regarding appt form [place] 06/30/13.  Verbalized understanding of appt time and place

## 2013-06-30 ENCOUNTER — Telehealth: Payer: Self-pay | Admitting: Internal Medicine

## 2013-06-30 ENCOUNTER — Ambulatory Visit: Payer: Medicare Other | Admitting: Internal Medicine

## 2013-06-30 ENCOUNTER — Ambulatory Visit: Payer: Medicare Other | Admitting: Physical Therapy

## 2013-06-30 ENCOUNTER — Ambulatory Visit: Payer: Medicare Other | Admitting: Radiation Oncology

## 2013-06-30 ENCOUNTER — Telehealth: Payer: Self-pay | Admitting: *Deleted

## 2013-06-30 LAB — ASPERGILLUS ANTIBODY (COMPLEMENT FIX): Aspergillus Antibody by Complement Fix: 1:16 {titer} — ABNORMAL HIGH

## 2013-06-30 NOTE — Telephone Encounter (Signed)
Case reviewed at cancer conference today.  Dr [name] states pt does not need to be seen by med or rad onc.  Pt needs to follow up with primary for fungus infection.  Called and spoke with wife and she verbalized understanding

## 2013-06-30 NOTE — Telephone Encounter (Signed)
Spoke with the pt's spouse and notified of recs per [facility] She verbalized understanding  Nothing further needed

## 2013-06-30 NOTE — Telephone Encounter (Signed)
Per pt's chart, Norton Blizzard RN had called pt's spouse this morning to inform her that Dr [name] does not think that pt's Aspergillus needs to be treated by medical or radiation oncology, and recommended that pt follows up with [facility] for treatment.  (did call Hinton Dyer to discuss with her personally for verification)  Called spoke with pt's spouse [name] and discussed the above with her.  While Eustaquio Maize is okay with the recommendation for pt to follow up here for treatment of the Aspergillus, she is wanting to go ahead with some form of treatment.  Pt does have an appt already scheduled with [facility] for 2.13.15.  Dr Annamaria Boots, does pt need to be seen sooner than the scheduled appt?  Treatment over the phone?  Please advise, thank you.  Allergies  Allergen Reactions  . Morphine And Related Other (See Comments)    Decreases respirations   Current Outpatient Prescriptions on File Prior to Visit  Medication Sig Dispense Refill  . albuterol (PROVENTIL) (2.5 MG/3ML) 0.083% nebulizer solution Take 2.5 mg by nebulization every 6 (six) hours as needed for wheezing or shortness of breath.      [name] albuterol (VENTOLIN HFA) 108 (90 BASE) MCG/ACT inhaler Inhale 2 puffs into the lungs every 6 (six) hours as needed for wheezing or shortness of breath.  18 g  prn  . amphetamine-dextroamphetamine (ADDERALL, 30MG ,) 30 MG tablet Take 30 mg by mouth 2 (two) times daily.        [name] aspirin 81 MG tablet Take 81 mg by mouth daily.        [name] atorvastatin (LIPITOR) 80 MG tablet Take 80 mg by mouth at bedtime.      . Cholecalciferol (VITAMIN D) 1000 UNITS capsule Take 1,000 Units by mouth daily.        . clonazePAM (KLONOPIN) 0.5 MG tablet Take 0.5 mg by mouth 2 (two) times daily as needed for anxiety.      [name] dextromethorphan-guaiFENesin (MUCINEX DM) 30-600 MG per 12 hr tablet Take 1 tablet by mouth every 12 (twelve) hours.        . diclofenac sodium (VOLTAREN) 1 % GEL Apply 1 application topically 2 (two) times daily as needed.       .  lansoprazole (PREVACID) 30 MG capsule Take 1 capsule (30 mg total) by mouth daily.  30 capsule  5  . methocarbamol (ROBAXIN) 500 MG tablet Take 500 mg by mouth every 6 (six) hours as needed (muscle spasms).      . metoprolol succinate (TOPROL-XL) 50 MG 24 hr tablet Take 50 mg by mouth at bedtime.      . nitroGLYCERIN (NITROSTAT) 0.4 MG SL tablet Place 1 tablet (0.4 mg total) under the tongue every 5 (five) minutes as needed for chest pain.  25 tablet  3  . Oxycodone HCl (OXYCONTIN) 60 MG TB12 Take 1 tablet by mouth 2 (two) times daily.       [name] oxyCODONE-acetaminophen (PERCOCET) 10-325 MG per tablet Take 1 tablet by mouth every 6 (six) hours as needed for pain.        No current facility-administered medications on file prior to visit.

## 2013-06-30 NOTE — Telephone Encounter (Signed)
This fungus infection grows extremely slowly, so we are not losing ground. I would like to be able to sit and talk with them about the available medications, rather than tryingto explain over the phone. I think I do best for [name] if we wait for scheduled appointment.

## 2013-07-02 LAB — ASPERGILLUS ANTIBODY BY IMMUNODIFF: ASPERGILLUS ANTIBODY ID: POSITIVE — AB

## 2013-07-08 ENCOUNTER — Encounter: Payer: Self-pay | Admitting: Internal Medicine

## 2013-07-08 ENCOUNTER — Ambulatory Visit (INDEPENDENT_AMBULATORY_CARE_PROVIDER_SITE_OTHER): Payer: Medicare Other | Admitting: Internal Medicine

## 2013-07-08 VITALS — BP 126/70 | HR 60 | Ht 69.5 in | Wt 169.0 lb

## 2013-07-08 DIAGNOSIS — B470 Eumycetoma: Secondary | ICD-10-CM

## 2013-07-08 DIAGNOSIS — J449 Chronic obstructive pulmonary disease, unspecified: Secondary | ICD-10-CM

## 2013-07-08 DIAGNOSIS — C3490 Malignant neoplasm of unspecified part of unspecified bronchus or lung: Secondary | ICD-10-CM

## 2013-07-08 MED ORDER — ITRACONAZOLE 100 MG PO CAPS: ORAL_CAPSULE | ORAL | Status: DC
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
## 2013-07-08 NOTE — Assessment & Plan Note (Signed)
In remission.

## 2013-07-08 NOTE — Assessment & Plan Note (Signed)
controlled 

## 2013-07-08 NOTE — Assessment & Plan Note (Addendum)
Plan-after given an information about fungal mycetoma in discussing significant side effects and risk benefit considerations we're going to begin a trial of itraconazole. Potential interaction with statin discussed. Check liver functions as we start medication

## 2013-07-08 NOTE — Patient Instructions (Signed)
Script sent to start itraconazole/ Sporanox.  This is to treat the fungus ball "mycetoma" in your left lung.  This medicine may interact with your lipitor to cause muscle aches. If you start having a lot of unusual muscle aches and pains, stop the lipitor and let us know.   Order- future lab- BMET, Liver panel in 2 weeks    Dx mycetoma

## 2013-07-13 ENCOUNTER — Telehealth: Payer: Self-pay | Admitting: Internal Medicine

## 2013-07-13 DIAGNOSIS — B470 Eumycetoma: Secondary | ICD-10-CM

## 2013-07-13 NOTE — Telephone Encounter (Signed)
Called [facility] and spoke with Rob  He is to refax the PA form to the triage faxline  Pt aware we are working on this   Received the fax and called [phone] to initiate PA  Pt ID number [id number]  Answered the clinical questions over the phone Will forward to Katie to keep an eye out on approval/denial

## 2013-07-18 LAB — FUNGUS CULTURE W SMEAR

## 2013-07-18 NOTE — Telephone Encounter (Signed)
Katie have you received anything on pt yet? thanks

## 2013-07-18 NOTE — Telephone Encounter (Signed)
Patient's wife calling checking on the status of PA.  [phone]

## 2013-07-20 NOTE — Telephone Encounter (Signed)
Since my suggestion for treating his pulmonary mycetoma with itraconazole has been denied by his insurance, Please refer for Infectious disease consultation

## 2013-07-20 NOTE — Telephone Encounter (Signed)
[phone]--TYYPEJ about the status of PA.

## 2013-07-20 NOTE — Telephone Encounter (Signed)
PA has been denied. Papers attached to phone note.

## 2013-07-22 ENCOUNTER — Telehealth: Payer: Self-pay | Admitting: Internal Medicine

## 2013-07-22 NOTE — Telephone Encounter (Signed)
Spoke with the pt's spouse and notified of recs per CDY  Order placed to [facility] for referral

## 2013-07-22 NOTE — Telephone Encounter (Signed)
Called spoke with spouse. Advised her we are referring pt to [facility] and they will be called for an appt. Nothing further needed

## 2013-08-02 ENCOUNTER — Telehealth: Payer: Self-pay | Admitting: *Deleted

## 2013-08-02 ENCOUNTER — Ambulatory Visit (INDEPENDENT_AMBULATORY_CARE_PROVIDER_SITE_OTHER): Payer: Medicare Other | Admitting: Internal Medicine

## 2013-08-02 ENCOUNTER — Encounter: Payer: Self-pay | Admitting: Internal Medicine

## 2013-08-02 VITALS — BP 113/78 | HR 88 | Temp 98.8°F | Wt 164.0 lb

## 2013-08-02 DIAGNOSIS — K1370 Unspecified lesions of oral mucosa: Secondary | ICD-10-CM

## 2013-08-02 DIAGNOSIS — B4481 Allergic bronchopulmonary aspergillosis: Secondary | ICD-10-CM

## 2013-08-02 LAB — COMPLETE METABOLIC PANEL WITH GFR
ALT: 18 U/L (ref 0–53)
AST: 30 U/L (ref 0–37)
Albumin: 4 g/dL (ref 3.5–5.2)
Alkaline Phosphatase: 101 U/L (ref 39–117)
BUN: 28 mg/dL — ABNORMAL HIGH (ref 6–23)
CALCIUM: 9.4 mg/dL (ref 8.4–10.5)
CHLORIDE: 100 meq/L (ref 96–112)
CO2: 29 meq/L (ref 19–32)
CREATININE: 1.64 mg/dL — AB (ref 0.50–1.35)
GFR, Est African American: 51 mL/min — ABNORMAL LOW
GFR, Est Non African American: 44 mL/min — ABNORMAL LOW
Glucose, Bld: 115 mg/dL — ABNORMAL HIGH (ref 70–99)
Potassium: 4.3 mEq/L (ref 3.5–5.3)
Sodium: 137 mEq/L (ref 135–145)
TOTAL PROTEIN: 6.9 g/dL (ref 6.0–8.3)
Total Bilirubin: 0.7 mg/dL (ref 0.2–1.2)

## 2013-08-02 LAB — AFB CULTURE WITH SMEAR (NOT AT ARMC)

## 2013-08-02 LAB — CBC WITH DIFFERENTIAL/PLATELET
BASOS PCT: 1 % (ref 0–1)
Basophils Absolute: 0.1 10*3/uL (ref 0.0–0.1)
EOS ABS: 0.4 10*3/uL (ref 0.0–0.7)
Eosinophils Relative: 4 % (ref 0–5)
HCT: 47.9 % (ref 39.0–52.0)
Hemoglobin: 16 g/dL (ref 13.0–17.0)
Lymphocytes Relative: 44 % (ref 12–46)
Lymphs Abs: 4.9 10*3/uL — ABNORMAL HIGH (ref 0.7–4.0)
MCH: 30 pg (ref 26.0–34.0)
MCHC: 33.4 g/dL (ref 30.0–36.0)
MCV: 89.9 fL (ref 78.0–100.0)
Monocytes Absolute: 1.2 10*3/uL — ABNORMAL HIGH (ref 0.1–1.0)
Monocytes Relative: 11 % (ref 3–12)
NEUTROS PCT: 40 % — AB (ref 43–77)
Neutro Abs: 4.4 10*3/uL (ref 1.7–7.7)
PLATELETS: 298 10*3/uL (ref 150–400)
RBC: 5.33 MIL/uL (ref 4.22–5.81)
RDW: 14.3 % (ref 11.5–15.5)
WBC: 11.1 10*3/uL — ABNORMAL HIGH (ref 4.0–10.5)

## 2013-08-02 MED ORDER — VORICONAZOLE 200 MG PO TABS: 200.0000 mg | ORAL_TABLET | Freq: Two times a day (BID) | ORAL | Status: DC

## 2013-08-02 NOTE — Progress Notes (Signed)
[facility] Infectious Disease - Pharmacist    HPI: [name] is a 63 y.o. male who was referred for treatment for pulmonary mycetoma and a bronch with multiple biopsy which showed aspergillus. Patient to start on voriconazole today.  Allergies: Allergies  Allergen Reactions  . Morphine And Related Other (See Comments)    Decreases respirations    Vitals: Temp: 98.8 F (37.1 C) (03/10 0907) Temp src: Oral (03/10 0907) BP: 113/78 mmHg (03/10 0907) Pulse Rate: 88 (03/10 0907)  Past Medical History: Past Medical History  Diagnosis Date  . Atrial flutter     s/p ablation by Dr. Lovena Le  . Coronary artery disease     artery bypass graft 2008  . Hypercholesterolemia   . Hypertension   . COPD (chronic obstructive pulmonary disease)   . Hepatitis, unspecified   . Arthritis   . Neuropathic pain   . Hiatal hernia 2003    EGD  . Foreign body in esophagus 2003    EGD  . Stricture and stenosis of esophagus 2003    EGD  . GERD (gastroesophageal reflux disease) 2003    EGD  . Internal hemorrhoids 2010    Colonoscopy   . Blood transfusion without reported diagnosis   . Barrett's esophagus without dysplasia 03/11/2012  . Lung cancer     right, NSC, s/p bilobectomy-R, [facility] 06/2010, Squamous Cell  . ADHD (attention deficit hyperactivity disorder) 05/02/2013  . Chronic kidney disease     Dr Holley Raring nephrologist-[place]    Social History: History   Social History  . Marital Status: Married    Spouse Name: N/A    Number of Children: 3  . Years of Education: N/A   Occupational History  . disabled    Social History Main Topics  . Smoking status: Former Smoker -- 2.00 packs/day for 45 years    Types: Cigarettes    Quit date: 01/24/2009  . Smokeless tobacco: Never Used     Comment: smokes, 100 + packs year history. Quit 2010  . Alcohol Use: 0.0 oz/week     Comment: occasionally  . Drug Use: No  . Sexual Activity: None   Other Topics Concern  . None     Social History Narrative   No exercise.   Disabled male who drinks alcohol occasionally.    Labs: Hepatitis B Surface Ag (no units)  Date Value  11/28/2009 NEGATIVE      HCV Ab (no units)  Date Value  11/28/2009 NEGATIVE     Lipids:    Component Value Date/Time   CHOL 221* 01/10/2010 1020   TRIG 124.0 01/10/2010 1020   HDL 48.00 01/10/2010 1020   CHOLHDL 5 01/10/2010 1020   VLDL 24.8 01/10/2010 1020   LDLCALC  Value: 28        Total Cholesterol/HDL:CHD Risk Coronary Heart Disease Risk Table                     Men   Women  1/2 Average Risk   3.4   3.3  Average Risk       5.0   4.4  2 X Average Risk   9.6   7.1  3 X Average Risk  23.4   11.0        Use the calculated Patient Ratio above and the CHD Risk Table to determine the patient's CHD Risk.        ATP III CLASSIFICATION (LDL):  <100     mg/dL  Optimal  100-129  mg/dL   Near or Above                    Optimal  130-159  mg/dL   Borderline  160-189  mg/dL   High  >190     mg/dL   Very High 11/29/2009 0345    Assessment: I discussed the side effects of voriconazole with [name], including possible liver injury and the need for routine liver function tests, changes in vision, and increased sun sensitivity. He described understanding in observing for signs of liver damage (jaundice, dark urine, excessive skin itching) as well as to report changes in vision, or other issues. We also discussed potential drug interactions, and [name] knows to ensure his physicians and pharmacists know of any changes to his medication regimen due to this potential.   Recommendations: Start voriconazole. Monitor for side effects and potential drug interactions  Viet Kemmerer C. Conner Muegge, PharmD Clinical Pharmacist-Resident Pager: [phone] Pharmacy: [phone] 08/02/2013 5:44 PM
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
## 2013-08-02 NOTE — Telephone Encounter (Signed)
Per referral from Dr Baxter Flattery called Dr Hoyt Koch office to schedule the patient an appt for oral lesions. Was given appt for 08/24/13 at 315 pm. Advised the patient of this appt also gave the phone number and address. [phone]  Advised the patient if he can not keep the appt to give the office a call to cancel or reschedule.

## 2013-08-05 ENCOUNTER — Ambulatory Visit: Payer: Medicare Other | Admitting: Internal Medicine

## 2013-08-08 ENCOUNTER — Telehealth: Payer: Self-pay | Admitting: *Deleted

## 2013-08-08 DIAGNOSIS — R82998 Other abnormal findings in urine: Secondary | ICD-10-CM

## 2013-08-08 NOTE — Telephone Encounter (Signed)
Per Dr. Baxter Flattery, patient needs to be seen.  Pt unable to come today for labwork, overbooked with Dr. Baxter Flattery tomorrow at 8:45.  CMP order placed.  Pt advised per Dr. Baxter Flattery to hold his losartan and drink water.  Pt advised to go to the ED for catheterization if he cannot urinate and is in pain before his visit tomorrow morning.  Pt verbalized understanding. Landis Gandy, RN

## 2013-08-08 NOTE — Telephone Encounter (Signed)
Patient called wanting Dr. Baxter Flattery to know that he is having difficulty urinating (hesitation, difficulty starting/stopping stream, dripping; never feels empty) and is constipated.  Pt reports that his urine is "Dark yellow, light brown" in color.  Pt states he "hasn't really had anything to drink and is probably dehydrated."   Pt is also taking losartan for his kidneys per his wife. Pt wants to know if 1) the new rx VFEND is causing this and 2) if he can take a laxative (mag/citrate) to help with constipation. RN advised patient to contact PCP in addition to [place].  Please advise.  [name] Gandy, RN

## 2013-08-08 NOTE — Addendum Note (Signed)
Addended by: Landis Gandy on: 08/08/2013 03:50 PM   Modules accepted: Orders

## 2013-08-09 ENCOUNTER — Ambulatory Visit (INDEPENDENT_AMBULATORY_CARE_PROVIDER_SITE_OTHER): Payer: Medicare Other | Admitting: Internal Medicine

## 2013-08-09 ENCOUNTER — Encounter: Payer: Self-pay | Admitting: Internal Medicine

## 2013-08-09 VITALS — BP 120/81 | HR 98 | Temp 97.8°F | Wt 164.0 lb

## 2013-08-09 DIAGNOSIS — B449 Aspergillosis, unspecified: Secondary | ICD-10-CM

## 2013-08-09 DIAGNOSIS — R82998 Other abnormal findings in urine: Secondary | ICD-10-CM

## 2013-08-09 DIAGNOSIS — R1011 Right upper quadrant pain: Secondary | ICD-10-CM

## 2013-08-09 LAB — COMPLETE METABOLIC PANEL WITH GFR
ALK PHOS: 125 U/L — AB (ref 39–117)
ALT: 17 U/L (ref 0–53)
AST: 39 U/L — ABNORMAL HIGH (ref 0–37)
Albumin: 4.4 g/dL (ref 3.5–5.2)
BUN: 20 mg/dL (ref 6–23)
CO2: 28 mEq/L (ref 19–32)
Calcium: 9.8 mg/dL (ref 8.4–10.5)
Chloride: 101 mEq/L (ref 96–112)
Creat: 1.8 mg/dL — ABNORMAL HIGH (ref 0.50–1.35)
GFR, EST NON AFRICAN AMERICAN: 39 mL/min — AB
GFR, Est African American: 46 mL/min — ABNORMAL LOW
Glucose, Bld: 87 mg/dL (ref 70–99)
Potassium: 5 mEq/L (ref 3.5–5.3)
SODIUM: 135 meq/L (ref 135–145)
TOTAL PROTEIN: 7.5 g/dL (ref 6.0–8.3)
Total Bilirubin: 0.6 mg/dL (ref 0.2–1.2)
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
## 2013-08-10 LAB — URINALYSIS
Glucose, UA: NEGATIVE mg/dL
Hgb urine dipstick: NEGATIVE
Leukocytes, UA: NEGATIVE
NITRITE: NEGATIVE
Protein, ur: 100 mg/dL — AB
SPECIFIC GRAVITY, URINE: 1.029 (ref 1.005–1.030)
Urobilinogen, UA: 1 mg/dL (ref 0.0–1.0)
pH: 5.5 (ref 5.0–8.0)

## 2013-08-16 ENCOUNTER — Ambulatory Visit: Payer: Medicare Other | Admitting: Internal Medicine

## 2013-09-06 ENCOUNTER — Ambulatory Visit (INDEPENDENT_AMBULATORY_CARE_PROVIDER_SITE_OTHER): Payer: Medicare Other | Admitting: Internal Medicine

## 2013-09-06 ENCOUNTER — Encounter: Payer: Self-pay | Admitting: Internal Medicine

## 2013-09-06 VITALS — BP 127/81 | HR 98 | Temp 98.3°F | Wt 163.0 lb

## 2013-09-06 DIAGNOSIS — B4481 Allergic bronchopulmonary aspergillosis: Secondary | ICD-10-CM

## 2013-09-06 DIAGNOSIS — B441 Other pulmonary aspergillosis: Secondary | ICD-10-CM

## 2013-09-06 LAB — CBC WITH DIFFERENTIAL/PLATELET
Basophils Absolute: 0.1 10*3/uL (ref 0.0–0.1)
Basophils Relative: 1 % (ref 0–1)
EOS ABS: 0.2 10*3/uL (ref 0.0–0.7)
Eosinophils Relative: 2 % (ref 0–5)
HCT: 43.8 % (ref 39.0–52.0)
HEMOGLOBIN: 14.8 g/dL (ref 13.0–17.0)
LYMPHS ABS: 4.8 10*3/uL — AB (ref 0.7–4.0)
Lymphocytes Relative: 42 % (ref 12–46)
MCH: 30.3 pg (ref 26.0–34.0)
MCHC: 33.8 g/dL (ref 30.0–36.0)
MCV: 89.8 fL (ref 78.0–100.0)
MONOS PCT: 13 % — AB (ref 3–12)
Monocytes Absolute: 1.5 10*3/uL — ABNORMAL HIGH (ref 0.1–1.0)
NEUTROS PCT: 42 % — AB (ref 43–77)
Neutro Abs: 4.8 10*3/uL (ref 1.7–7.7)
Platelets: 353 10*3/uL (ref 150–400)
RBC: 4.88 MIL/uL (ref 4.22–5.81)
RDW: 14.9 % (ref 11.5–15.5)
WBC: 11.4 10*3/uL — ABNORMAL HIGH (ref 4.0–10.5)

## 2013-09-06 LAB — COMPLETE METABOLIC PANEL WITH GFR
ALBUMIN: 3.7 g/dL (ref 3.5–5.2)
ALT: 9 U/L (ref 0–53)
AST: 26 U/L (ref 0–37)
Alkaline Phosphatase: 161 U/L — ABNORMAL HIGH (ref 39–117)
BILIRUBIN TOTAL: 0.5 mg/dL (ref 0.2–1.2)
BUN: 25 mg/dL — ABNORMAL HIGH (ref 6–23)
CHLORIDE: 102 meq/L (ref 96–112)
CO2: 29 mEq/L (ref 19–32)
Calcium: 9.4 mg/dL (ref 8.4–10.5)
Creat: 1.59 mg/dL — ABNORMAL HIGH (ref 0.50–1.35)
GFR, EST AFRICAN AMERICAN: 53 mL/min — AB
GFR, Est Non African American: 46 mL/min — ABNORMAL LOW
GLUCOSE: 66 mg/dL — AB (ref 70–99)
POTASSIUM: 4.8 meq/L (ref 3.5–5.3)
SODIUM: 137 meq/L (ref 135–145)
TOTAL PROTEIN: 6.9 g/dL (ref 6.0–8.3)

## 2013-09-06 NOTE — Progress Notes (Signed)
Subjective:    Patient ID: [name], male    DOB: 15-Aug-1950, 63 y.o.   MRN: [id number]  HPI [name] isa 63yo M with past hx of squamous cell lung ca, CAD, CKD who has been identified as having bronchopulmonary aspergillosis. He has now been on voriconazole x 4 wk. He was doing well until this weekend when he noticed having Sunburn/rash to bilateral forearms from the weekend. C/o epigastric pain but also abdominal discomfrot with working? Bending over. He takes prevacid which gives him some relief  Current Outpatient Prescriptions on File Prior to Visit  Medication Sig Dispense Refill  . albuterol (PROVENTIL) (2.5 MG/3ML) 0.083% nebulizer solution Take 2.5 mg by nebulization every 6 (six) hours as needed for wheezing or shortness of breath.      [name] albuterol (VENTOLIN HFA) 108 (90 BASE) MCG/ACT inhaler Inhale 2 puffs into the lungs every 6 (six) hours as needed for wheezing or shortness of breath.  18 g  prn  . amphetamine-dextroamphetamine (ADDERALL, 30MG ,) 30 MG tablet Take 30 mg by mouth 2 (two) times daily.        [name] aspirin 81 MG tablet Take 81 mg by mouth daily.        . Cholecalciferol (VITAMIN D) 1000 UNITS capsule Take 1,000 Units by mouth daily.        . clonazePAM (KLONOPIN) 0.5 MG tablet Take 0.5 mg by mouth 2 (two) times daily as needed for anxiety.      . diclofenac sodium (VOLTAREN) 1 % GEL Apply 1 application topically 2 (two) times daily as needed.       . itraconazole (SPORANOX) 100 MG capsule 2 daily  60 capsule  1  . lansoprazole (PREVACID) 30 MG capsule Take 1 capsule (30 mg total) by mouth daily.  30 capsule  5  . losartan (COZAAR) 25 MG tablet Take 25 mg by mouth daily.      . methocarbamol (ROBAXIN) 500 MG tablet Take 500 mg by mouth every 6 (six) hours as needed (muscle spasms).      . metoprolol succinate (TOPROL-XL) 50 MG 24 hr tablet Take 50 mg by mouth at bedtime.      . nitroGLYCERIN (NITROSTAT) 0.4 MG SL tablet Place 1 tablet (0.4 mg total) under the  tongue every 5 (five) minutes as needed for chest pain.  25 tablet  3  . Oxycodone HCl (OXYCONTIN) 60 MG TB12 Take 1 tablet by mouth 2 (two) times daily.       [name] oxyCODONE-acetaminophen (PERCOCET) 10-325 MG per tablet Take 1 tablet by mouth every 6 (six) hours as needed for pain.       [name] voriconazole (VFEND) 200 MG tablet Take 1 tablet (200 mg total) by mouth 2 (two) times daily.  60 tablet  5   No current facility-administered medications on file prior to visit.   Active Ambulatory Problems    Diagnosis Date Noted  . HERPES SIMPLEX WITHOUT MENTION OF COMPLICATION 40/12/6759  . HYPERCHOLESTEROLEMIA 01/01/2009  . HYPERTENSION 01/01/2009  . CAD, ARTERY BYPASS GRAFT 05/03/2009  . GERD 01/01/2009  . HEPATITIS 01/01/2009  . PROSTATITIS, CHRONIC 02/14/2010  . ORGANIC IMPOTENCE 11/15/2009  . ARTHRITIS 01/01/2009  . CARCINOMA, LUNG, SQUAMOUS CELL 05/30/2010  . Pleural effusion, right 10/10/2010  . COPD (chronic obstructive pulmonary disease) 10/10/2010  . Anxiety 12/27/2010  . Chronic kidney disease (CKD), stage IV (severe) 07/09/2011  . Peripheral vascular disease 07/30/2011  . Barrett's esophagus without dysplasia 03/11/2012  . ADHD (attention deficit hyperactivity  disorder) 05/02/2013  . Atrial flutter   . Tobacco abuse, in remission 05/02/2013  . Post-splenectomy 05/02/2013  . Iliac artery stenosis, left, s/p stent 05/02/2013  . Solitary pulmonary nodule 06/10/2013  . Fungal mycetoma 07/08/2013   Resolved Ambulatory Problems    Diagnosis Date Noted  . COPD 01/01/2009  . FATIGUE 01/01/2009  . Headache 05/30/2010  . Acute bronchitis 06/19/2010  . COPD exacerbation 12/27/2010  . Epigastric abdominal pain 06/17/2011  . Hyperkalemia 07/09/2011  . Coronary artery disease 07/30/2011   Past Medical History  Diagnosis Date  . Hypercholesterolemia   . Hypertension   . Arthritis   . Neuropathic pain   . Hiatal hernia 2003  . Foreign body in esophagus 2003  . Stricture and  stenosis of esophagus 2003  . GERD (gastroesophageal reflux disease) 2003  . Internal hemorrhoids 2010  . Blood transfusion without reported diagnosis   . Lung cancer   . Chronic kidney disease       Review of Systems 10 point ros inc shortness of breath with ambulation > 50-75 yd. Rash to forearms. Otherwise negative    Objective:   Physical Exam BP 127/81  Pulse 98  Temp(Src) 98.3 F (36.8 C) (Oral)  Wt 163 lb (73.936 kg) Physical Exam  Constitutional: He is oriented to person, place, and time. He appears well-developed and well-nourished. No distress.  HENT:  Mouth/Throat: Oropharynx is clear and moist. No oropharyngeal exudate.  Cardiovascular: Normal rate, regular rhythm and normal heart sounds. Exam reveals no gallop and no friction rub.  No murmur heard.  Pulmonary/Chest: Effort normal and breath sounds normal. No respiratory distress. He has no wheezes.  Abdominal: Soft. Bowel sounds are normal. He exhibits no distension. There is no tenderness.  Lymphadenopathy:  He has no cervical adenopathy.  Neurological: He is alert and oriented to person, place, and time.  Skin: Skin is warm and dry. Bilateral forearms have scattered erythamatous papules Psychiatric: He has a normal mood and affect. His behavior is normal.          Assessment & Plan:  - will check labs to see no undue SE in labs - voriconazole & lansoprazole interaction. Will ask him to keep to daily dosing of lansoprazole - ckd, check to see it is improved - recommended to use long sleeve shirt and liberal use of sunscreen with SPF 30+  rtc in 4 wk

## 2013-09-08 ENCOUNTER — Encounter: Payer: Self-pay | Admitting: Internal Medicine

## 2013-09-08 ENCOUNTER — Ambulatory Visit (INDEPENDENT_AMBULATORY_CARE_PROVIDER_SITE_OTHER)
Admission: RE | Admit: 2013-09-08 | Discharge: 2013-09-08 | Disposition: A | Discharge status: Home / Self Care | Payer: Medicare Other | Source: Ambulatory Visit | Attending: Internal Medicine | Admitting: Internal Medicine

## 2013-09-08 ENCOUNTER — Ambulatory Visit: Payer: Medicare Other | Admitting: Internal Medicine

## 2013-09-08 ENCOUNTER — Ambulatory Visit (INDEPENDENT_AMBULATORY_CARE_PROVIDER_SITE_OTHER): Payer: Medicare Other | Admitting: Internal Medicine

## 2013-09-08 VITALS — BP 110/58 | HR 92 | Ht 69.5 in | Wt 171.0 lb

## 2013-09-08 DIAGNOSIS — A429 Actinomycosis, unspecified: Secondary | ICD-10-CM

## 2013-09-08 DIAGNOSIS — J449 Chronic obstructive pulmonary disease, unspecified: Secondary | ICD-10-CM

## 2013-09-08 DIAGNOSIS — J4489 Other specified chronic obstructive pulmonary disease: Secondary | ICD-10-CM

## 2013-09-08 DIAGNOSIS — B479 Mycetoma, unspecified: Secondary | ICD-10-CM

## 2013-09-08 DIAGNOSIS — B470 Eumycetoma: Secondary | ICD-10-CM

## 2013-09-08 DIAGNOSIS — C3490 Malignant neoplasm of unspecified part of unspecified bronchus or lung: Secondary | ICD-10-CM

## 2013-09-08 NOTE — Assessment & Plan Note (Signed)
Followed by oncology 
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
## 2013-09-08 NOTE — Patient Instructions (Addendum)
Order- CXR       Dx COPD, mycetoma  Please call as needed

## 2013-09-08 NOTE — Assessment & Plan Note (Signed)
Moderate restriction and obstruction sufficient to explain his dyspnea on exertion with no change.

## 2013-09-08 NOTE — Assessment & Plan Note (Signed)
He is tolerating therapy well except for photosensitivity. He understands how to protect his skin Plan-chest x-ray

## 2013-09-22 ENCOUNTER — Other Ambulatory Visit: Payer: Self-pay | Admitting: Family Medicine

## 2013-09-22 NOTE — Telephone Encounter (Signed)
Called to [facility]. 

## 2013-09-22 NOTE — Telephone Encounter (Signed)
Ok to refill 60, 3 ref

## 2013-09-22 NOTE — Telephone Encounter (Signed)
Last office visit 05/02/2013.  Ok to refill?

## 2013-10-04 ENCOUNTER — Encounter: Payer: Self-pay | Admitting: Internal Medicine

## 2013-10-04 ENCOUNTER — Ambulatory Visit (INDEPENDENT_AMBULATORY_CARE_PROVIDER_SITE_OTHER): Payer: Medicare Other | Admitting: Internal Medicine

## 2013-10-04 VITALS — BP 108/68 | HR 99 | Temp 98.1°F | Wt 161.0 lb

## 2013-10-04 DIAGNOSIS — B4481 Allergic bronchopulmonary aspergillosis: Secondary | ICD-10-CM

## 2013-10-04 DIAGNOSIS — B441 Other pulmonary aspergillosis: Secondary | ICD-10-CM

## 2013-10-04 LAB — COMPREHENSIVE METABOLIC PANEL
ALK PHOS: 218 U/L — AB (ref 39–117)
ALT: 15 U/L (ref 0–53)
AST: 38 U/L — ABNORMAL HIGH (ref 0–37)
Albumin: 3.9 g/dL (ref 3.5–5.2)
BUN: 25 mg/dL — AB (ref 6–23)
CO2: 26 mEq/L (ref 19–32)
CREATININE: 2.1 mg/dL — AB (ref 0.50–1.35)
Calcium: 9.3 mg/dL (ref 8.4–10.5)
Chloride: 99 mEq/L (ref 96–112)
Glucose, Bld: 107 mg/dL — ABNORMAL HIGH (ref 70–99)
Potassium: 5.2 mEq/L (ref 3.5–5.3)
Sodium: 136 mEq/L (ref 135–145)
Total Bilirubin: 0.8 mg/dL (ref 0.2–1.2)
Total Protein: 7.1 g/dL (ref 6.0–8.3)

## 2013-10-04 NOTE — Progress Notes (Signed)
Subjective:    Patient ID: [name], male    DOB: February 01, 1951, 63 y.o.   MRN: [id number]  HPI  Avontae is a 63yo M with past hx of squamous cell lung ca, CAD, COPD, CKD who has been identified as having bronchopulmonary aspergillosis. He has now been on voriconazole x 8 wk. He was last seen in mid April where he had photosensitivity reaction to voriconazole nad  Sunlight, unprotected.  He had cxr in mid April that shows unchanged cavitary lesions. He is to have screening CT in August for lung cancer screening.  Current Outpatient Prescriptions on File Prior to Visit  Medication Sig Dispense Refill  . acyclovir (ZOVIRAX) 400 MG tablet       . albuterol (PROVENTIL) (2.5 MG/3ML) 0.083% nebulizer solution Take 2.5 mg by nebulization every 6 (six) hours as needed for wheezing or shortness of breath.      [name] albuterol (VENTOLIN HFA) 108 (90 BASE) MCG/ACT inhaler Inhale 2 puffs into the lungs every 6 (six) hours as needed for wheezing or shortness of breath.  18 g  prn  . amphetamine-dextroamphetamine (ADDERALL, 30MG ,) 30 MG tablet Take 30 mg by mouth 2 (two) times daily.        [name] aspirin 81 MG tablet Take 81 mg by mouth daily.        . Cholecalciferol (VITAMIN D) 1000 UNITS capsule Take 1,000 Units by mouth daily.        . clonazePAM (KLONOPIN) 0.5 MG tablet TAKE ONE TABLET BY MOUTH TWICE DAILY AS NEEDED  60 tablet  3  . diclofenac sodium (VOLTAREN) 1 % GEL Apply 1 application topically 2 (two) times daily as needed.       . itraconazole (SPORANOX) 100 MG capsule 2 daily  60 capsule  1  . lansoprazole (PREVACID) 30 MG capsule Take 1 capsule (30 mg total) by mouth daily.  30 capsule  5  . losartan (COZAAR) 25 MG tablet Take 25 mg by mouth daily.      . methocarbamol (ROBAXIN) 500 MG tablet Take 500 mg by mouth every 6 (six) hours as needed (muscle spasms).      . metoprolol succinate (TOPROL-XL) 50 MG 24 hr tablet Take 50 mg by mouth at bedtime.      . nitroGLYCERIN (NITROSTAT) 0.4 MG SL  tablet Place 1 tablet (0.4 mg total) under the tongue every 5 (five) minutes as needed for chest pain.  25 tablet  3  . Oxycodone HCl (OXYCONTIN) 60 MG TB12 Take 1 tablet by mouth 2 (two) times daily.       [name] oxyCODONE-acetaminophen (PERCOCET) 10-325 MG per tablet Take 1 tablet by mouth every 6 (six) hours as needed for pain.       [name] voriconazole (VFEND) 200 MG tablet Take 1 tablet (200 mg total) by mouth 2 (two) times daily.  60 tablet  5   No current facility-administered medications on file prior to visit.   Active Ambulatory Problems    Diagnosis Date Noted  . HERPES SIMPLEX WITHOUT MENTION OF COMPLICATION 19/62/2297  . HYPERCHOLESTEROLEMIA 01/01/2009  . HYPERTENSION 01/01/2009  . CAD, ARTERY BYPASS GRAFT 05/03/2009  . GERD 01/01/2009  . HEPATITIS 01/01/2009  . PROSTATITIS, CHRONIC 02/14/2010  . ORGANIC IMPOTENCE 11/15/2009  . ARTHRITIS 01/01/2009  . CARCINOMA, LUNG, SQUAMOUS CELL 05/30/2010  . Pleural effusion, right 10/10/2010  . COPD (chronic obstructive pulmonary disease) 10/10/2010  . Anxiety 12/27/2010  . Chronic kidney disease (CKD), stage IV (severe) 07/09/2011  .  Peripheral vascular disease 07/30/2011  . Barrett's esophagus without dysplasia 03/11/2012  . ADHD (attention deficit hyperactivity disorder) 05/02/2013  . Atrial flutter   . Tobacco abuse, in remission 05/02/2013  . Post-splenectomy 05/02/2013  . Iliac artery stenosis, left, s/p stent 05/02/2013  . Solitary pulmonary nodule 06/10/2013  . Fungal mycetoma 07/08/2013   Resolved Ambulatory Problems    Diagnosis Date Noted  . COPD 01/01/2009  . FATIGUE 01/01/2009  . Headache 05/30/2010  . Acute bronchitis 06/19/2010  . COPD exacerbation 12/27/2010  . Epigastric abdominal pain 06/17/2011  . Hyperkalemia 07/09/2011  . Coronary artery disease 07/30/2011   Past Medical History  Diagnosis Date  . Hypercholesterolemia   . Hypertension   . Arthritis   . Neuropathic pain   . Hiatal hernia 2003  . Foreign  body in esophagus 2003  . Stricture and stenosis of esophagus 2003  . GERD (gastroesophageal reflux disease) 2003  . Internal hemorrhoids 2010  . Blood transfusion without reported diagnosis   . Lung cancer   . Chronic kidney disease       Review of Systems 10 ros negative except what is mentioned in hpi    Objective:   Physical Exam BP 108/68  Pulse 99  Temp(Src) 98.1 F (36.7 C) (Oral)  Wt 161 lb (73.029 kg) Physical Exam  Constitutional: He is oriented to person, place, and time. He appears well-developed and well-nourished. No distress.  HENT:  Mouth/Throat: Oropharynx is clear and moist. No oropharyngeal exudate.  Cardiovascular: Normal rate, regular rhythm and normal heart sounds. Exam reveals no gallop and no friction rub.  No murmur heard.  Pulmonary/Chest: Effort normal and breath sounds normal. No respiratory distress. He has no wheezes.  Abdominal: Soft. Bowel sounds are normal. He exhibits no distension. There is no tenderness.  Lymphadenopathy:  He has no cervical adenopathy.  Neurological: He is alert and oriented to person, place, and time.  Skin: Skin is warm and dry. No rash noted. No erythema.  Psychiatric: He has a normal mood and affect. His behavior is normal.          Assessment & Plan:  Pulmonary aspergillosis = will check cmp. Also give rx to get cmp also when they are in [place] this summer. Continue with voriconazole. Be very liberal with sunscreen. We will have him get labs while he is away then also look at aug CT scan to see if any changes occuring in affected area.  rtc in 2 months

## 2013-10-12 ENCOUNTER — Telehealth: Payer: Self-pay

## 2013-10-12 NOTE — Telephone Encounter (Signed)
Mrs Ralph left v/m; pt having a lot of problems with constipation; pt is able to have BM but is a constipated BM and small amt. Stool softener and Na Citrate has not helped. Mrs. Wajda wonders if Linzess would be appropriate for pt. [place].Please advise.

## 2013-10-13 NOTE — Telephone Encounter (Signed)
i think that is a good idea - i sent in to [place].

## 2013-10-13 NOTE — Telephone Encounter (Signed)
Elizabeth notified prescription has been sent to [facility] for [employer].

## 2013-11-10 ENCOUNTER — Telehealth: Payer: Self-pay | Admitting: Internal Medicine

## 2013-11-10 NOTE — Telephone Encounter (Signed)
s.w. pt wife and r/s appt done....ok and awre they will call cs to r/s ct

## 2013-11-15 ENCOUNTER — Ambulatory Visit (HOSPITAL_COMMUNITY): Payer: Medicare Other

## 2013-11-15 ENCOUNTER — Other Ambulatory Visit: Payer: Medicare Other

## 2013-11-22 ENCOUNTER — Ambulatory Visit: Payer: Medicare Other | Admitting: Internal Medicine

## 2013-11-22 ENCOUNTER — Telehealth: Payer: Self-pay | Admitting: *Deleted

## 2013-11-22 NOTE — Telephone Encounter (Signed)
Pt at [facility] today in [place] to have his labs drawn.  Confirmed with Larene Beach that they will fax the results to [place] at [phone] when available. Landis Gandy, RN

## 2013-12-01 ENCOUNTER — Telehealth: Payer: Self-pay | Admitting: Internal Medicine

## 2013-12-01 NOTE — Telephone Encounter (Signed)
pt called to r/s appt...done..pt aware of new d.t...transferred pt to radiology

## 2013-12-06 ENCOUNTER — Ambulatory Visit (HOSPITAL_COMMUNITY): Payer: Medicare Other

## 2013-12-06 ENCOUNTER — Other Ambulatory Visit: Payer: Medicare Other

## 2013-12-12 ENCOUNTER — Ambulatory Visit: Payer: Medicare Other | Admitting: Internal Medicine

## 2013-12-13 ENCOUNTER — Ambulatory Visit: Payer: Medicare Other | Admitting: Internal Medicine

## 2013-12-29 ENCOUNTER — Other Ambulatory Visit: Payer: Self-pay | Admitting: *Deleted

## 2013-12-29 NOTE — Telephone Encounter (Signed)
Last office visit 05/02/2013 for Abdominal pain.   Ok to refill?

## 2013-12-30 MED ORDER — LANSOPRAZOLE 30 MG PO CPDR: 30.0000 mg | DELAYED_RELEASE_CAPSULE | Freq: Every day | ORAL | Status: DC

## 2013-12-30 MED ORDER — CLONAZEPAM 0.5 MG PO TABS
ORAL_TABLET | ORAL | Status: DC
Start: ? — End: 2017-05-06

## 2013-12-30 NOTE — Telephone Encounter (Signed)
Klonopin called to [facility].

## 2013-12-30 NOTE — Telephone Encounter (Signed)
Ok to refill klonopin 0.5 mg po bid prn anxiety #60, 3 refills  Prevacid 30 mg, 1 po daily, #30, 11 refills

## 2014-01-04 ENCOUNTER — Other Ambulatory Visit: Payer: Medicare Other

## 2014-01-04 ENCOUNTER — Encounter (HOSPITAL_COMMUNITY): Payer: Self-pay

## 2014-01-04 ENCOUNTER — Other Ambulatory Visit: Payer: Self-pay | Admitting: Medical Oncology

## 2014-01-04 ENCOUNTER — Ambulatory Visit (HOSPITAL_COMMUNITY)
Admission: RE | Admit: 2014-01-04 | Discharge: 2014-01-04 | Disposition: A | Discharge status: Home / Self Care | Payer: Medicare Other | Source: Ambulatory Visit | Attending: Internal Medicine | Admitting: Internal Medicine

## 2014-01-04 DIAGNOSIS — C3490 Malignant neoplasm of unspecified part of unspecified bronchus or lung: Secondary | ICD-10-CM | POA: Diagnosis not present

## 2014-01-04 LAB — COMPREHENSIVE METABOLIC PANEL
ALBUMIN: 3.6 g/dL (ref 3.5–5.2)
ALT: 9 U/L (ref 0–53)
AST: 30 U/L (ref 0–37)
Alkaline Phosphatase: 193 U/L — ABNORMAL HIGH (ref 39–117)
BILIRUBIN TOTAL: 0.3 mg/dL (ref 0.2–1.2)
BUN: 25 mg/dL — ABNORMAL HIGH (ref 6–23)
CO2: 27 meq/L (ref 19–32)
Calcium: 9.2 mg/dL (ref 8.4–10.5)
Chloride: 102 mEq/L (ref 96–112)
Creatinine, Ser: 1.59 mg/dL — ABNORMAL HIGH (ref 0.50–1.35)
GLUCOSE: 134 mg/dL — AB (ref 70–99)
Potassium: 4.6 mEq/L (ref 3.5–5.3)
SODIUM: 139 meq/L (ref 135–145)
TOTAL PROTEIN: 7.2 g/dL (ref 6.0–8.3)

## 2014-01-04 MED ORDER — IOHEXOL 300 MG/ML  SOLN
80.0000 mL | Freq: Once | INTRAMUSCULAR | Status: AC | PRN
Start: 2014-01-04 — End: 2014-01-04
  Administered 2014-01-04: 80 mL via INTRAVENOUS

## 2014-01-09 ENCOUNTER — Ambulatory Visit (INDEPENDENT_AMBULATORY_CARE_PROVIDER_SITE_OTHER): Payer: Medicare Other | Admitting: Internal Medicine

## 2014-01-09 ENCOUNTER — Encounter: Payer: Self-pay | Admitting: Internal Medicine

## 2014-01-09 VITALS — BP 124/80 | HR 93 | Ht 69.5 in | Wt 162.0 lb

## 2014-01-09 DIAGNOSIS — R918 Other nonspecific abnormal finding of lung field: Secondary | ICD-10-CM

## 2014-01-09 DIAGNOSIS — J90 Pleural effusion, not elsewhere classified: Secondary | ICD-10-CM

## 2014-01-09 DIAGNOSIS — C3490 Malignant neoplasm of unspecified part of unspecified bronchus or lung: Secondary | ICD-10-CM

## 2014-01-09 DIAGNOSIS — Z85118 Personal history of other malignant neoplasm of bronchus and lung: Secondary | ICD-10-CM

## 2014-01-09 DIAGNOSIS — J439 Emphysema, unspecified: Secondary | ICD-10-CM

## 2014-01-09 DIAGNOSIS — J438 Other emphysema: Secondary | ICD-10-CM

## 2014-01-09 DIAGNOSIS — B470 Eumycetoma: Secondary | ICD-10-CM

## 2014-01-09 NOTE — Assessment & Plan Note (Signed)
CT shows a pleural peel/ chronic loculated R effusion, best left alone.

## 2014-01-09 NOTE — Assessment & Plan Note (Signed)
Cavitary lesion left lung is slowly shrinking

## 2014-01-09 NOTE — Patient Instructions (Signed)
Order- PET scan neck to thigh    Dx lung nodules, hx lung CA   We will see what Dr Baxter Flattery and Dr [name] think. We will probably be considering a follow-up Chest CT without contrast in 2-3 months

## 2014-01-09 NOTE — Assessment & Plan Note (Signed)
Concerning nodules especially right lower lobe. I reviewed the CT images with him and his wife. We discussed the radiology recommendation and his pending appointments. He may need a followup CT scan in 2 or 3 months but we think it would be more appropriate to address the question of activity especially in the right lower lobe by going forward with a PET scan now. He anticipates return to [place]. Plan- PET for lung nodules. Consider bronchoscopy after that, depending on findings and input from his other doctors.
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
## 2014-01-09 NOTE — Assessment & Plan Note (Signed)
No acute process. Complicated by history of prior lobectomy and multiple medical problems.

## 2014-01-10 ENCOUNTER — Telehealth: Payer: Self-pay | Admitting: Internal Medicine

## 2014-01-10 ENCOUNTER — Ambulatory Visit (HOSPITAL_BASED_OUTPATIENT_CLINIC_OR_DEPARTMENT_OTHER): Payer: Medicare Other | Admitting: Internal Medicine

## 2014-01-10 ENCOUNTER — Encounter: Payer: Self-pay | Admitting: Internal Medicine

## 2014-01-10 VITALS — BP 146/93 | HR 99 | Temp 97.8°F | Resp 18 | Ht 69.5 in | Wt 159.3 lb

## 2014-01-10 DIAGNOSIS — C3490 Malignant neoplasm of unspecified part of unspecified bronchus or lung: Secondary | ICD-10-CM

## 2014-01-10 DIAGNOSIS — C342 Malignant neoplasm of middle lobe, bronchus or lung: Secondary | ICD-10-CM

## 2014-01-10 DIAGNOSIS — C3430 Malignant neoplasm of lower lobe, unspecified bronchus or lung: Secondary | ICD-10-CM

## 2014-01-10 NOTE — Telephone Encounter (Signed)
gv adn printed appt sched and avs fo rpt for Feb 2016
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
## 2014-01-13 ENCOUNTER — Encounter (HOSPITAL_COMMUNITY): Payer: Self-pay

## 2014-01-13 ENCOUNTER — Telehealth: Payer: Self-pay | Admitting: Internal Medicine

## 2014-01-13 ENCOUNTER — Encounter (HOSPITAL_COMMUNITY)
Admission: RE | Admit: 2014-01-13 | Discharge: 2014-01-13 | Disposition: A | Discharge status: Home / Self Care | Payer: Medicare Other | Source: Ambulatory Visit | Attending: Internal Medicine | Admitting: Internal Medicine

## 2014-01-13 DIAGNOSIS — Z85118 Personal history of other malignant neoplasm of bronchus and lung: Secondary | ICD-10-CM

## 2014-01-13 DIAGNOSIS — R918 Other nonspecific abnormal finding of lung field: Secondary | ICD-10-CM | POA: Insufficient documentation

## 2014-01-13 LAB — GLUCOSE, CAPILLARY: Glucose-Capillary: 103 mg/dL — ABNORMAL HIGH (ref 70–99)

## 2014-01-13 MED ORDER — FLUDEOXYGLUCOSE F - 18 (FDG) INJECTION: 7.7000 | Freq: Once | INTRAVENOUS | Status: AC | PRN

## 2014-01-13 NOTE — Telephone Encounter (Signed)
Result Notes    Notes Recorded by Deneise Lever, MD on 01/13/2014 at 2:13 PM PET scan- good report. The nodules we were concerned about in the lower right lung are stable with very low activity. The cavity in the left lung is stable and less active looking than before..Nothing looks like active or metastatic disease.              Pt and wife are aware of results. Nothing more needed at this time.

## 2014-01-16 ENCOUNTER — Encounter: Payer: Self-pay | Admitting: Internal Medicine

## 2014-01-16 ENCOUNTER — Ambulatory Visit (INDEPENDENT_AMBULATORY_CARE_PROVIDER_SITE_OTHER): Payer: Medicare Other | Admitting: Internal Medicine

## 2014-01-16 VITALS — BP 141/91 | HR 86 | Temp 97.3°F | Wt 163.0 lb

## 2014-01-16 DIAGNOSIS — B441 Other pulmonary aspergillosis: Secondary | ICD-10-CM

## 2014-01-16 DIAGNOSIS — B4481 Allergic bronchopulmonary aspergillosis: Secondary | ICD-10-CM

## 2014-01-16 DIAGNOSIS — I25810 Atherosclerosis of coronary artery bypass graft(s) without angina pectoris: Secondary | ICD-10-CM

## 2014-01-17 ENCOUNTER — Other Ambulatory Visit: Payer: Self-pay | Admitting: Family Medicine

## 2014-01-17 NOTE — Telephone Encounter (Signed)
Last office visit 05/02/2013.  Ok to refill?

## 2014-01-25 ENCOUNTER — Encounter: Payer: Self-pay | Admitting: Family Medicine

## 2014-01-25 ENCOUNTER — Ambulatory Visit (INDEPENDENT_AMBULATORY_CARE_PROVIDER_SITE_OTHER): Payer: Medicare Other | Admitting: Family Medicine

## 2014-01-25 VITALS — BP 94/64 | HR 86 | Temp 98.4°F | Ht 69.5 in | Wt 161.0 lb

## 2014-01-25 DIAGNOSIS — I10 Essential (primary) hypertension: Secondary | ICD-10-CM

## 2014-01-25 DIAGNOSIS — R61 Generalized hyperhidrosis: Secondary | ICD-10-CM

## 2014-01-25 DIAGNOSIS — IMO0001 Reserved for inherently not codable concepts without codable children: Secondary | ICD-10-CM

## 2014-01-25 DIAGNOSIS — I25810 Atherosclerosis of coronary artery bypass graft(s) without angina pectoris: Secondary | ICD-10-CM

## 2014-01-25 MED ORDER — ALBUTEROL SULFATE HFA 108 (90 BASE) MCG/ACT IN AERS: 2.0000 | INHALATION_SPRAY | Freq: Four times a day (QID) | RESPIRATORY_TRACT | Status: DC | PRN

## 2014-01-25 MED ORDER — ALBUTEROL SULFATE (2.5 MG/3ML) 0.083% IN NEBU: 2.5000 mg | INHALATION_SOLUTION | Freq: Four times a day (QID) | RESPIRATORY_TRACT | Status: DC | PRN

## 2014-01-25 NOTE — Patient Instructions (Signed)
Stop Metoprolol today  Keep a check on the BP every day and write down.

## 2014-01-25 NOTE — Progress Notes (Signed)
Pre visit review using our clinic review tool, if applicable. No additional management support is needed unless otherwise documented below in the visit note. 

## 2014-01-25 NOTE — Progress Notes (Signed)
Dr. Frederico Hamman T. Marykatherine Sherwood, MD, [facility] [place], [address] Phone: [phone] Fax: [phone]  01/25/2014  Patient: [name], MRN: [id number], DOB: October 26, 1950, 63 y.o.  Primary Physician:  [name], MD  Chief Complaint: Trouble Hearing, Blood Pressure Check and Cough   Subjective:   [name] is a 63 y.o. very pleasant male patient who presents with the following:  Pleasant man with Stage 2 lung CA, presents for BP f/u.   HTN: Tolerating all medications, but pulse low with toprol - sometimes not right. He has not been taking it regularly. Stable and at goal? Sometimes not.  No CP, no sob. No HA.  BP Readings from Last 3 Encounters:  01/25/14 94/64  01/16/14 141/91  01/10/14 627/03    Basic Metabolic Panel:    Component Value Date/Time   NA 139 01/04/2014 1043   NA 141 05/23/2013 1427   NA 143 01/05/2012 1110   K 4.6 01/04/2014 1043   K 4.8 05/23/2013 1427   K 4.9* 01/05/2012 1110   CL 102 01/04/2014 1043   CL 101 06/30/2012 1203   CL 100 01/05/2012 1110   CO2 27 01/04/2014 1043   CO2 26 05/23/2013 1427   CO2 30 01/05/2012 1110   BUN 25* 01/04/2014 1043   BUN 21.0 05/23/2013 1427   BUN 24* 01/05/2012 1110   CREATININE 1.59* 01/04/2014 1043   CREATININE 2.10* 10/04/2013 1424   CREATININE 1.7* 05/23/2013 1427   GLUCOSE 134* 01/04/2014 1043   GLUCOSE 117 05/23/2013 1427   GLUCOSE 102* 06/30/2012 1203   GLUCOSE 112 01/05/2012 1110   CALCIUM 9.2 01/04/2014 1043   CALCIUM 9.6 05/23/2013 1427   CALCIUM 9.4 01/05/2012 1110   He also has been coughing, he has active Aspergillus, sweating, not feeling that great, and ? Fever, none now.   Past Medical History, Surgical History, Social History, Family History, Problem List, Medications, and Allergies have been reviewed and updated if relevant.   GEN: No acute illnesses, no fevers, chills. GI: No n/v/d, eating normally Pulm: Some  coughing. Interactive and getting along well at home.  Otherwise, ROS is as per the HPI.  Objective:   BP 94/64  Pulse 86  Temp(Src) 98.4 F (36.9 C) (Oral)  Ht 5' 9.5" (1.765 m)  Wt 161 lb (73.029 kg)  BMI 23.44 kg/m2  SpO2 97%   GEN: WDWN, NAD, Non-toxic, A & O x 3 HEENT: Atraumatic, Normocephalic. Neck supple. No masses, No LAD. Ears and Nose: No external deformity. CV: RRR, No M/G/R. No JVD. No thrill. No extra heart sounds. PULM: CTA B, no wheezes, crackles, rhonchi. No retractions. No resp. distress. No accessory muscle use. EXTR: No c/c/e NEURO Normal gait.  PSYCH: Normally interactive. Conversant. Not depressed or anxious appearing.  Calm demeanor.    Laboratory and Imaging Data:  Assessment and Plan:   HYPERTENSION  Atherosclerosis of coronary artery bypass graft of native heart without angina pectoris  Sweating  Leave me BP log If Cr stable, then can increase ARB  Expect sweating, not feeling good from fungal infection and SE from meds.   Follow-up: No Follow-up on file.  Patient Instructions  Stop Metoprolol today  Keep a check on the BP every day and write down.      Signed,  Maud Deed. Adante Courington, MD   Patient's Medications  New Prescriptions   No medications on file  Previous Medications   ACYCLOVIR (ZOVIRAX) 400 MG  TABLET       AMPHETAMINE-DEXTROAMPHETAMINE (ADDERALL, 30MG ,) 30 MG TABLET    Take 30 mg by mouth 2 (two) times daily.     ASPIRIN 81 MG TABLET    Take 81 mg by mouth daily.     CHOLECALCIFEROL (VITAMIN D) 1000 UNITS CAPSULE    Take 1,000 Units by mouth daily.     CLONAZEPAM (KLONOPIN) 0.5 MG TABLET    TAKE ONE TABLET BY MOUTH TWICE DAILY AS NEEDED   DICLOFENAC SODIUM (VOLTAREN) 1 % GEL    Apply 1 application topically 2 (two) times daily as needed.    LANSOPRAZOLE (PREVACID) 30 MG CAPSULE    Take 1 capsule (30 mg total) by mouth daily.   LOSARTAN (COZAAR) 25 MG TABLET    Take 25 mg by mouth daily.   METHOCARBAMOL (ROBAXIN) 500  MG TABLET    Take 500 mg by mouth every 6 (six) hours as needed (muscle spasms).   METHOCARBAMOL (ROBAXIN) 500 MG TABLET    TAKE ONE TABLET BY MOUTH EVERY 6 HOURS AS NEEDED FOR MUSCLE ACHES/STIFFNESS   NITROGLYCERIN (NITROSTAT) 0.4 MG SL TABLET    Place 1 tablet (0.4 mg total) under the tongue every 5 (five) minutes as needed for chest pain.   OXYCODONE HCL (OXYCONTIN) 60 MG TB12    Take 1 tablet by mouth 2 (two) times daily.    OXYCODONE-ACETAMINOPHEN (PERCOCET) 10-325 MG PER TABLET    Take 1 tablet by mouth every 6 (six) hours as needed for pain.    VORICONAZOLE (VFEND) 200 MG TABLET    Take 1 tablet (200 mg total) by mouth 2 (two) times daily.  Modified Medications   Modified Medication Previous Medication   ALBUTEROL (PROVENTIL) (2.5 MG/3ML) 0.083% NEBULIZER SOLUTION albuterol (PROVENTIL) (2.5 MG/3ML) 0.083% nebulizer solution      Take 3 mLs (2.5 mg total) by nebulization every 6 (six) hours as needed for wheezing or shortness of breath.    Take 2.5 mg by nebulization every 6 (six) hours as needed for wheezing or shortness of breath.   ALBUTEROL (VENTOLIN HFA) 108 (90 BASE) MCG/ACT INHALER albuterol (VENTOLIN HFA) 108 (90 BASE) MCG/ACT inhaler      Inhale 2 puffs into the lungs every 6 (six) hours as needed for wheezing or shortness of breath.    Inhale 2 puffs into the lungs every 6 (six) hours as needed for wheezing or shortness of breath.  Discontinued Medications   METOPROLOL SUCCINATE (TOPROL-XL) 50 MG 24 HR TABLET    Take 50 mg by mouth at bedtime.

## 2014-01-27 NOTE — Progress Notes (Signed)
Subjective:    Patient ID: [name], male    DOB: [birth year], 63 y.o.   MRN: [id number]  HPI [name] is a 63yo M with past hx of squamous cell lung ca, CAD, COPD, CKD who has been identified as having bronchopulmonary aspergillosis. He has now been on voriconazole since Mid-March. He was last seen in mid April where he had photosensitivity reaction to voriconazole nad Sunlight, unprotected. He had cxr in mid April that shows unchanged cavitary lesions. He is to have screening CT in August for lung cancer screening. He has been spending time in [place], still getting sun exposure.  Postoperative changes in the right lung. Clustered nodular densities  throughout the right posterior mid and lower lung fields, slightly  progressed since prior study. Example of an enlarged nodule in the  right lower lung of measures 11 mm on image 42, not well visualized  on prior study.  Cavitary lesion in the left upper lobe measures 16 mm compared to 20  mm maximally previously. Biapical pleural/ parenchymal scarring,  right greater than left, stable. Right pleural thickening and trace  chronic right pleural effusion are stable.  No mediastinal, hilar, or axillary adenopathy. Heart is upper limits  normal in size. Dense coronary artery calcifications throughout the  coronary arteries. Aorta is normal caliber.   Current Outpatient Prescriptions on File Prior to Visit  Medication Sig Dispense Refill  . clonazePAM (KLONOPIN) 0.5 MG tablet TAKE ONE TABLET BY MOUTH TWICE DAILY AS NEEDED  60 tablet  3  . diclofenac sodium (VOLTAREN) 1 % GEL Apply 1 application topically 2 (two) times daily as needed.       . lansoprazole (PREVACID) 30 MG capsule Take 1 capsule (30 mg total) by mouth daily.  30 capsule  11  . losartan (COZAAR) 25 MG tablet Take 25 mg by mouth daily.      . methocarbamol (ROBAXIN) 500 MG tablet Take 500 mg by mouth every 6 (six) hours as needed (muscle spasms).      . nitroGLYCERIN  (NITROSTAT) 0.4 MG SL tablet Place 1 tablet (0.4 mg total) under the tongue every 5 (five) minutes as needed for chest pain.  25 tablet  3  . Oxycodone HCl (OXYCONTIN) 60 MG TB12 Take 1 tablet by mouth 2 (two) times daily.       [name] oxyCODONE-acetaminophen (PERCOCET) 10-325 MG per tablet Take 1 tablet by mouth every 6 (six) hours as needed for pain.       [name] voriconazole (VFEND) 200 MG tablet Take 1 tablet (200 mg total) by mouth 2 (two) times daily.  60 tablet  5  . acyclovir (ZOVIRAX) 400 MG tablet       . amphetamine-dextroamphetamine (ADDERALL, 30MG ,) 30 MG tablet Take 30 mg by mouth 2 (two) times daily.        [name] aspirin 81 MG tablet Take 81 mg by mouth daily.        . Cholecalciferol (VITAMIN D) 1000 UNITS capsule Take 1,000 Units by mouth daily.         No current facility-administered medications on file prior to visit.   Active Ambulatory Problems    Diagnosis Date Noted  . Herpes simplex without mention of complication 61/44/3154  . HYPERCHOLESTEROLEMIA 01/01/2009  . HYPERTENSION 01/01/2009  . CAD, ARTERY BYPASS GRAFT 05/03/2009  . GERD 01/01/2009  . HEPATITIS 01/01/2009  . PROSTATITIS, CHRONIC 02/14/2010  . ORGANIC IMPOTENCE 11/15/2009  . ARTHRITIS 01/01/2009  . CARCINOMA, LUNG, SQUAMOUS CELL 05/30/2010  .  Pleural effusion, right 10/10/2010  . COPD (chronic obstructive pulmonary disease) with emphysema 10/10/2010  . Anxiety 12/27/2010  . Chronic kidney disease (CKD), stage IV (severe) 07/09/2011  . Peripheral vascular disease 07/30/2011  . Barrett's esophagus without dysplasia 03/11/2012  . ADHD (attention deficit hyperactivity disorder) 05/02/2013  . Atrial flutter   . Tobacco abuse, in remission 05/02/2013  . Post-splenectomy 05/02/2013  . Iliac artery stenosis, left, s/p stent 05/02/2013  . Fungal mycetoma 07/08/2013   Resolved Ambulatory Problems    Diagnosis Date Noted  . COPD 01/01/2009  . FATIGUE 01/01/2009  . Headache 05/30/2010  . Acute bronchitis 06/19/2010    . COPD exacerbation 12/27/2010  . Epigastric abdominal pain 06/17/2011  . Hyperkalemia 07/09/2011  . Coronary artery disease 07/30/2011  . Solitary pulmonary nodule 06/10/2013   Past Medical History  Diagnosis Date  . Hypercholesterolemia   . Hypertension   . COPD (chronic obstructive pulmonary disease)   . Arthritis   . Neuropathic pain   . Hiatal hernia 2003  . Foreign body in esophagus 2003  . Stricture and stenosis of esophagus 2003  . GERD (gastroesophageal reflux disease) 2003  . Internal hemorrhoids 2010  . Blood transfusion without reported diagnosis   . Lung cancer   . Chronic kidney disease        Review of Systems     Objective:   Physical Exam BP 141/91  Pulse 86  Temp(Src) 97.3 F (36.3 C) (Oral)  Wt 163 lb (73.936 kg) Physical Exam  Constitutional: He is oriented to person, place, and time. He appears well-developed and well-nourished. No distress.  HENT:  Mouth/Throat: Oropharynx is clear and moist. No oropharyngeal exudate.  Cardiovascular: Normal rate, regular rhythm and normal heart sounds. Exam reveals no gallop and no friction rub.  No murmur heard.  Pulmonary/Chest: Effort normal and breath sounds normal. No respiratory distress. He has no wheezes.  Abdominal: Soft. Bowel sounds are normal. He exhibits no distension. There is no tenderness.  Lymphadenopathy:  He has no cervical adenopathy.  Neurological: He is alert and oriented to person, place, and time.  Skin: Skin is warm and dry. No rash noted. No erythema.  Psychiatric: He has a normal mood and affect. His behavior is normal.          Assessment & Plan:  Pulmonary aspergillosis = now on 5th month of treatment. Will check cmp. Recommend strongly to use liberal sunscreen due to voriconazole sunsentivity. He is doing well thus far. Continue to course. See him back in 1 month. Cavitary lesion appears to be decreasing in size. Will see if pet scan is planned to investigate other small  nodules noted on CT scan

## 2014-02-01 ENCOUNTER — Telehealth: Payer: Self-pay | Admitting: *Deleted

## 2014-02-01 MED ORDER — METOPROLOL TARTRATE 25 MG PO TABS: 25.0000 mg | ORAL_TABLET | Freq: Two times a day (BID) | ORAL | Status: DC

## 2014-02-01 NOTE — Telephone Encounter (Signed)
Spoke with [name] and advised that Dr. Lorelei Pont reviewed his B/P reading he dropped off and although his BP & Pulse are looking better, Dr. Lorelei Pont wants him back on the metoprolol.  Prescription sent to [facility].  Instructed Shaurya to continue monitoring his BP & Pulse.  If his pulse drops below 50, advised them to call the office.

## 2014-02-03 ENCOUNTER — Other Ambulatory Visit: Payer: Self-pay | Admitting: *Deleted

## 2014-02-03 MED ORDER — LOSARTAN POTASSIUM 25 MG PO TABS: 25.0000 mg | ORAL_TABLET | Freq: Every day | ORAL | Status: DC

## 2014-02-03 MED ORDER — LANSOPRAZOLE 30 MG PO CPDR: 30.0000 mg | DELAYED_RELEASE_CAPSULE | Freq: Every day | ORAL | Status: DC

## 2014-02-03 MED ORDER — ALBUTEROL SULFATE (2.5 MG/3ML) 0.083% IN NEBU: 2.5000 mg | INHALATION_SOLUTION | Freq: Four times a day (QID) | RESPIRATORY_TRACT | Status: DC | PRN

## 2014-02-03 MED ORDER — ALBUTEROL SULFATE HFA 108 (90 BASE) MCG/ACT IN AERS: 2.0000 | INHALATION_SPRAY | Freq: Four times a day (QID) | RESPIRATORY_TRACT | Status: DC | PRN

## 2014-02-03 MED ORDER — METOPROLOL TARTRATE 25 MG PO TABS: 25.0000 mg | ORAL_TABLET | Freq: Two times a day (BID) | ORAL | Status: DC

## 2014-02-03 MED ORDER — NITROGLYCERIN 0.4 MG SL SUBL: 0.4000 mg | SUBLINGUAL_TABLET | SUBLINGUAL | Status: DC | PRN

## 2014-02-15 ENCOUNTER — Telehealth: Payer: Self-pay | Admitting: *Deleted

## 2014-02-15 MED ORDER — ALBUTEROL SULFATE (2.5 MG/3ML) 0.083% IN NEBU: 2.5000 mg | INHALATION_SOLUTION | Freq: Four times a day (QID) | RESPIRATORY_TRACT | Status: DC | PRN

## 2014-02-15 MED ORDER — NITROGLYCERIN 0.4 MG SL SUBL: 0.4000 mg | SUBLINGUAL_TABLET | SUBLINGUAL | Status: DC | PRN

## 2014-02-15 MED ORDER — LANSOPRAZOLE 30 MG PO CPDR: 30.0000 mg | DELAYED_RELEASE_CAPSULE | Freq: Every day | ORAL | Status: DC

## 2014-02-15 MED ORDER — ALBUTEROL SULFATE HFA 108 (90 BASE) MCG/ACT IN AERS: 2.0000 | INHALATION_SPRAY | Freq: Four times a day (QID) | RESPIRATORY_TRACT | Status: DC | PRN

## 2014-02-15 MED ORDER — LOSARTAN POTASSIUM 25 MG PO TABS: 25.0000 mg | ORAL_TABLET | Freq: Every day | ORAL | Status: DC

## 2014-02-15 MED ORDER — METOPROLOL TARTRATE 25 MG PO TABS: 25.0000 mg | ORAL_TABLET | Freq: Two times a day (BID) | ORAL | Status: DC

## 2014-02-15 NOTE — Telephone Encounter (Signed)
Prescriptions faxed to [phone].

## 2014-02-15 NOTE — Telephone Encounter (Addendum)
[name] came by and dropped off forms requesting [name]'s prescriptions be faxed to [facility].   Prescriptions printed and placed in Dr. Lillie Fragmin in box for signature.

## 2014-03-06 ENCOUNTER — Ambulatory Visit: Payer: Medicare Other | Admitting: Internal Medicine

## 2014-03-14 ENCOUNTER — Ambulatory Visit (INDEPENDENT_AMBULATORY_CARE_PROVIDER_SITE_OTHER): Payer: Medicare Other | Admitting: Internal Medicine

## 2014-03-14 ENCOUNTER — Encounter: Payer: Self-pay | Admitting: Internal Medicine

## 2014-03-14 VITALS — BP 128/72 | HR 59 | Ht 69.5 in | Wt 166.6 lb

## 2014-03-14 DIAGNOSIS — J432 Centrilobular emphysema: Secondary | ICD-10-CM

## 2014-03-14 DIAGNOSIS — L568 Other specified acute skin changes due to ultraviolet radiation: Secondary | ICD-10-CM

## 2014-03-14 DIAGNOSIS — Z86008 Personal history of in-situ neoplasm of other site: Secondary | ICD-10-CM

## 2014-03-14 DIAGNOSIS — R911 Solitary pulmonary nodule: Secondary | ICD-10-CM

## 2014-03-14 DIAGNOSIS — Z23 Encounter for immunization: Secondary | ICD-10-CM

## 2014-03-14 DIAGNOSIS — Z86005 Personal history of in-situ neoplasm of middle ear and respiratory system: Secondary | ICD-10-CM

## 2014-03-14 DIAGNOSIS — B470 Eumycetoma: Secondary | ICD-10-CM

## 2014-03-14 NOTE — Patient Instructions (Signed)
Flu vax  Order- schedule future CT chest no contrast in December  Dx lung nodule,  Fungal mycetoma, hx carcinoma lung  Try using an otc lubricating eye ointment for your sore eyes.
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
## 2014-03-15 DIAGNOSIS — L568 Other specified acute skin changes due to ultraviolet radiation: Secondary | ICD-10-CM | POA: Insufficient documentation

## 2014-03-15 NOTE — Assessment & Plan Note (Signed)
Clinically stable control. No changes required

## 2014-03-15 NOTE — Assessment & Plan Note (Signed)
Stable lesion with relatively low uptake on PET scan, nonspecific Plan-followup CT scan of the chest without contrast in December, watching for change in the left upper lobe lesion.

## 2014-05-05 ENCOUNTER — Telehealth: Payer: Self-pay | Admitting: *Deleted

## 2014-05-05 NOTE — Telephone Encounter (Signed)
Patient's wife initially left a voice mail asking for refill of his fungal medication. When I called back she said he has refills and when I mentioned that he did not have a follow up appointment she said they were actually in [place] and planning on relocating there. She is in the process of finding a pulmonologist.  Myrtis Hopping

## 2014-05-15 ENCOUNTER — Inpatient Hospital Stay: Admission: RE | Admit: 2014-05-15 | Payer: Medicare Other | Source: Ambulatory Visit

## 2014-05-23 ENCOUNTER — Telehealth: Payer: Self-pay

## 2014-05-23 DIAGNOSIS — G894 Chronic pain syndrome: Secondary | ICD-10-CM

## 2014-05-23 DIAGNOSIS — C3400 Malignant neoplasm of unspecified main bronchus: Secondary | ICD-10-CM

## 2014-05-23 NOTE — Telephone Encounter (Signed)
One month ago pt moved to [place] and has not been able to establish with PCP; pt has spoken with Loreli Dollar MD's office for pain mgt and was advised to have Dr Lorelei Pont do referral so can see pain mgt; pt is almost out of pain meds. Pt said she does not need records only referral. Pt request cb. Loreli Dollar MD phone # [phone] and fax # [phone]; address [address] [address].Please advise.

## 2014-06-14 DIAGNOSIS — M961 Postlaminectomy syndrome, not elsewhere classified: Secondary | ICD-10-CM | POA: Diagnosis not present

## 2014-06-14 DIAGNOSIS — G0431 Postinfectious acute necrotizing hemorrhagic encephalopathy: Secondary | ICD-10-CM | POA: Diagnosis not present

## 2014-06-14 DIAGNOSIS — M5416 Radiculopathy, lumbar region: Secondary | ICD-10-CM | POA: Diagnosis not present

## 2014-06-14 DIAGNOSIS — M545 Low back pain: Secondary | ICD-10-CM | POA: Diagnosis not present

## 2014-06-14 DIAGNOSIS — Z5181 Encounter for therapeutic drug level monitoring: Secondary | ICD-10-CM | POA: Diagnosis not present

## 2014-06-14 DIAGNOSIS — M542 Cervicalgia: Secondary | ICD-10-CM | POA: Diagnosis not present

## 2014-06-20 DIAGNOSIS — H02413 Mechanical ptosis of bilateral eyelids: Secondary | ICD-10-CM | POA: Diagnosis not present

## 2014-06-20 DIAGNOSIS — H2513 Age-related nuclear cataract, bilateral: Secondary | ICD-10-CM | POA: Diagnosis not present

## 2014-07-10 ENCOUNTER — Ambulatory Visit (HOSPITAL_COMMUNITY): Payer: Self-pay

## 2014-07-10 ENCOUNTER — Other Ambulatory Visit: Payer: Medicare Other

## 2014-07-14 ENCOUNTER — Other Ambulatory Visit: Payer: Self-pay | Admitting: *Deleted

## 2014-07-14 ENCOUNTER — Telehealth: Payer: Self-pay | Admitting: Internal Medicine

## 2014-07-14 ENCOUNTER — Ambulatory Visit: Payer: Medicare Other | Admitting: Internal Medicine

## 2014-07-14 NOTE — Telephone Encounter (Signed)
, °

## 2014-07-17 ENCOUNTER — Ambulatory Visit: Payer: Medicare Other | Admitting: Internal Medicine

## 2014-08-03 ENCOUNTER — Ambulatory Visit: Payer: Self-pay | Admitting: Internal Medicine

## 2014-12-18 ENCOUNTER — Encounter: Payer: Self-pay | Admitting: Internal Medicine

## 2015-03-29 ENCOUNTER — Other Ambulatory Visit: Payer: Self-pay | Admitting: Family Medicine

## 2015-03-29 NOTE — Telephone Encounter (Signed)
Last office visit 01/25/2014.  Last refilled 01/17/2014 for #50 with 4 refills.  Refill?

## 2015-04-24 ENCOUNTER — Encounter: Payer: Self-pay | Admitting: Internal Medicine

## 2015-12-24 ENCOUNTER — Other Ambulatory Visit: Payer: Self-pay | Admitting: Family Medicine

## 2017-01-03 ENCOUNTER — Emergency Department (HOSPITAL_COMMUNITY)
Admission: EM | Admit: 2017-01-03 | Discharge: 2017-01-03 | Discharge status: Against Medical Advice | Payer: Medicare (Managed Care) | Attending: Emergency Medicine | Admitting: Emergency Medicine

## 2017-01-03 ENCOUNTER — Encounter (HOSPITAL_COMMUNITY): Payer: Self-pay | Admitting: Emergency Medicine

## 2017-01-03 ENCOUNTER — Emergency Department (HOSPITAL_COMMUNITY): Payer: Medicare (Managed Care)

## 2017-01-03 DIAGNOSIS — R05 Cough: Secondary | ICD-10-CM | POA: Diagnosis present

## 2017-01-03 DIAGNOSIS — Z87891 Personal history of nicotine dependence: Secondary | ICD-10-CM | POA: Diagnosis not present

## 2017-01-03 DIAGNOSIS — J209 Acute bronchitis, unspecified: Secondary | ICD-10-CM | POA: Insufficient documentation

## 2017-01-03 DIAGNOSIS — I2510 Atherosclerotic heart disease of native coronary artery without angina pectoris: Secondary | ICD-10-CM | POA: Diagnosis not present

## 2017-01-03 DIAGNOSIS — N184 Chronic kidney disease, stage 4 (severe): Secondary | ICD-10-CM | POA: Diagnosis not present

## 2017-01-03 DIAGNOSIS — I1310 Hypertensive heart and chronic kidney disease without heart failure, with stage 1 through stage 4 chronic kidney disease, or unspecified chronic kidney disease: Secondary | ICD-10-CM | POA: Insufficient documentation

## 2017-01-03 DIAGNOSIS — Z79899 Other long term (current) drug therapy: Secondary | ICD-10-CM | POA: Insufficient documentation

## 2017-01-03 DIAGNOSIS — J441 Chronic obstructive pulmonary disease with (acute) exacerbation: Secondary | ICD-10-CM | POA: Diagnosis not present

## 2017-01-03 LAB — CBC WITH DIFFERENTIAL/PLATELET
BASOS PCT: 0 %
Basophils Absolute: 0 10*3/uL (ref 0.0–0.1)
Eosinophils Absolute: 0.2 10*3/uL (ref 0.0–0.7)
Eosinophils Relative: 1 %
HCT: 41.6 % (ref 39.0–52.0)
Hemoglobin: 13.9 g/dL (ref 13.0–17.0)
LYMPHS ABS: 4.1 10*3/uL — AB (ref 0.7–4.0)
Lymphocytes Relative: 22 %
MCH: 30.8 pg (ref 26.0–34.0)
MCHC: 33.4 g/dL (ref 30.0–36.0)
MCV: 92 fL (ref 78.0–100.0)
Monocytes Absolute: 2.6 10*3/uL — ABNORMAL HIGH (ref 0.1–1.0)
Monocytes Relative: 14 %
Neutro Abs: 11.8 10*3/uL — ABNORMAL HIGH (ref 1.7–7.7)
Neutrophils Relative %: 63 %
Platelets: 294 10*3/uL (ref 150–400)
RBC: 4.52 MIL/uL (ref 4.22–5.81)
RDW: 15.1 % (ref 11.5–15.5)
WBC: 18.7 10*3/uL — ABNORMAL HIGH (ref 4.0–10.5)

## 2017-01-03 LAB — COMPREHENSIVE METABOLIC PANEL
ALBUMIN: 3.4 g/dL — AB (ref 3.5–5.0)
ALT: 15 U/L — ABNORMAL LOW (ref 17–63)
AST: 28 U/L (ref 15–41)
Alkaline Phosphatase: 90 U/L (ref 38–126)
Anion gap: 8 (ref 5–15)
BILIRUBIN TOTAL: 0.6 mg/dL (ref 0.3–1.2)
BUN: 25 mg/dL — AB (ref 6–20)
CO2: 27 mmol/L (ref 22–32)
CREATININE: 1.78 mg/dL — AB (ref 0.61–1.24)
Calcium: 9.5 mg/dL (ref 8.9–10.3)
Chloride: 101 mmol/L (ref 101–111)
GFR calc Af Amer: 44 mL/min — ABNORMAL LOW (ref >60)
GFR calc non Af Amer: 38 mL/min — ABNORMAL LOW (ref >60)
GLUCOSE: 74 mg/dL (ref 65–99)
Potassium: 4.1 mmol/L (ref 3.5–5.1)
Sodium: 136 mmol/L (ref 135–145)
Total Protein: 7.4 g/dL (ref 6.5–8.1)

## 2017-01-03 LAB — TROPONIN I

## 2017-01-03 MED ORDER — ALBUTEROL SULFATE (2.5 MG/3ML) 0.083% IN NEBU: 2.5000 mg | INHALATION_SOLUTION | Freq: Four times a day (QID) | RESPIRATORY_TRACT | 0 refills | Status: DC | PRN

## 2017-01-03 MED ORDER — IPRATROPIUM-ALBUTEROL 0.5-2.5 (3) MG/3ML IN SOLN
3.0000 mL | Freq: Once | RESPIRATORY_TRACT | Status: AC
  Administered 2017-01-03: 3 mL via RESPIRATORY_TRACT
  Filled 2017-01-03: qty 3

## 2017-01-03 MED ORDER — AZITHROMYCIN 250 MG PO TABS: ORAL_TABLET | ORAL | 0 refills | Status: DC

## 2017-01-03 MED ORDER — AZITHROMYCIN 250 MG PO TABS
500.0000 mg | ORAL_TABLET | Freq: Once | ORAL | Status: AC
  Administered 2017-01-03: 500 mg via ORAL
  Filled 2017-01-03: qty 2

## 2017-01-03 MED ORDER — METHYLPREDNISOLONE SODIUM SUCC 125 MG IJ SOLR
125.0000 mg | Freq: Once | INTRAMUSCULAR | Status: AC
Start: 2017-01-03 — End: 2017-01-03
  Administered 2017-01-03: 125 mg via INTRAVENOUS
  Filled 2017-01-03: qty 2

## 2017-01-03 MED ORDER — DEXTROSE 5 % IV SOLN
1.0000 g | Freq: Once | INTRAVENOUS | Status: AC
  Administered 2017-01-03: 1 g via INTRAVENOUS
  Filled 2017-01-03: qty 10

## 2017-01-03 MED ORDER — PREDNISONE 10 MG (21) PO TBPK: ORAL_TABLET | ORAL | 0 refills | Status: DC
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
## 2017-01-03 NOTE — ED Triage Notes (Addendum)
Pt reports 4 days hx of cough, shortness of breath,elevated temperature of 100 at night. Medication with Tylenol and Musinex. Last dosage at 10pm. Pt is alert, oriented and appropriate. Denies NVD. Denies chest pain. Wife at bedside.Skin very warm-Temp 98.7 po, checked x 2 Pt reports that he feels lightheaded when he walks over last few days Anterior breath sounds clear. Posterior upper congestion clears with cough

## 2017-01-03 NOTE — ED Notes (Signed)
Pt states that he has not left and still wants to be seen.

## 2017-01-03 NOTE — ED Triage Notes (Signed)
Pt stated to staff that he did not want ro wait any longer to be seen. Pt seen leaving ED.

## 2017-03-09 ENCOUNTER — Emergency Department (HOSPITAL_COMMUNITY)
Admission: EM | Admit: 2017-03-09 | Discharge: 2017-03-09 | Discharge status: Against Medical Advice | Payer: Medicare (Managed Care)

## 2017-03-09 NOTE — ED Notes (Signed)
pt's wife reports that they are leaving

## 2017-05-06 ENCOUNTER — Encounter: Payer: Self-pay | Admitting: Family Medicine

## 2017-05-06 ENCOUNTER — Ambulatory Visit (INDEPENDENT_AMBULATORY_CARE_PROVIDER_SITE_OTHER): Payer: Medicare HMO | Admitting: Family Medicine

## 2017-05-06 ENCOUNTER — Other Ambulatory Visit: Payer: Self-pay

## 2017-05-06 VITALS — BP 120/80 | HR 90 | Temp 97.5°F | Ht 68.5 in | Wt 161.2 lb

## 2017-05-06 DIAGNOSIS — I25810 Atherosclerosis of coronary artery bypass graft(s) without angina pectoris: Secondary | ICD-10-CM | POA: Diagnosis not present

## 2017-05-06 DIAGNOSIS — C3490 Malignant neoplasm of unspecified part of unspecified bronchus or lung: Secondary | ICD-10-CM | POA: Diagnosis not present

## 2017-05-06 DIAGNOSIS — M961 Postlaminectomy syndrome, not elsewhere classified: Secondary | ICD-10-CM

## 2017-05-06 DIAGNOSIS — N184 Chronic kidney disease, stage 4 (severe): Secondary | ICD-10-CM | POA: Diagnosis not present

## 2017-05-06 DIAGNOSIS — I739 Peripheral vascular disease, unspecified: Secondary | ICD-10-CM

## 2017-05-06 DIAGNOSIS — J438 Other emphysema: Secondary | ICD-10-CM | POA: Diagnosis not present

## 2017-05-06 DIAGNOSIS — F419 Anxiety disorder, unspecified: Secondary | ICD-10-CM | POA: Diagnosis not present

## 2017-05-06 DIAGNOSIS — M5416 Radiculopathy, lumbar region: Secondary | ICD-10-CM

## 2017-05-06 DIAGNOSIS — R69 Illness, unspecified: Secondary | ICD-10-CM | POA: Diagnosis not present

## 2017-05-06 DIAGNOSIS — I10 Essential (primary) hypertension: Secondary | ICD-10-CM | POA: Diagnosis not present

## 2017-05-06 HISTORY — DX: Postlaminectomy syndrome, not elsewhere classified: M96.1

## 2017-05-06 MED ORDER — CITALOPRAM HYDROBROMIDE 20 MG PO TABS: 20.0000 mg | ORAL_TABLET | Freq: Every day | ORAL | 1 refills | Status: DC

## 2017-05-06 MED ORDER — CLONAZEPAM 0.5 MG PO TABS: ORAL_TABLET | ORAL | 3 refills | Status: DC

## 2017-05-06 MED ORDER — FENTANYL 50 MCG/HR TD PT72: 50.0000 ug | MEDICATED_PATCH | TRANSDERMAL | 0 refills | Status: DC

## 2017-05-06 MED ORDER — OXYCODONE HCL ER 30 MG PO T12A: 1.0000 | EXTENDED_RELEASE_TABLET | Freq: Two times a day (BID) | ORAL | 0 refills | Status: DC

## 2017-05-06 NOTE — Patient Instructions (Signed)
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
## 2017-05-07 ENCOUNTER — Encounter: Payer: Self-pay | Admitting: Family Medicine

## 2017-05-11 ENCOUNTER — Telehealth: Payer: Self-pay | Admitting: Internal Medicine

## 2017-05-11 ENCOUNTER — Other Ambulatory Visit: Payer: Self-pay | Admitting: Family Medicine

## 2017-05-11 ENCOUNTER — Telehealth: Payer: Self-pay

## 2017-05-11 ENCOUNTER — Telehealth: Payer: Self-pay | Admitting: Pulmonary Disease

## 2017-05-11 MED ORDER — OXYCODONE HCL 30 MG PO TABS: ORAL_TABLET | ORAL | 0 refills | Status: DC

## 2017-05-11 NOTE — Telephone Encounter (Signed)
Bea from [facility] left v/m for clarification of oxycodone (oxycontin) on 05/06/17. Pt had previously been on oxycodone 30 mg immediate release tabs; Bea wanted to make sure was to change to oxycodone (oxycontin) 12 hr tab 30 mg. Bea request cb.

## 2017-05-11 NOTE — Telephone Encounter (Signed)
Spoke with patients wife regarding upcoming appointment .  She is aware of location due to him being a previous patient

## 2017-05-11 NOTE — Telephone Encounter (Signed)
Agree, d/c oxycontin, wrote new script for Oxycodone 30 mg IR.

## 2017-05-27 ENCOUNTER — Telehealth: Payer: Self-pay | Admitting: *Deleted

## 2017-05-27 ENCOUNTER — Encounter: Payer: Self-pay | Admitting: Internal Medicine

## 2017-05-27 ENCOUNTER — Ambulatory Visit (HOSPITAL_BASED_OUTPATIENT_CLINIC_OR_DEPARTMENT_OTHER): Payer: Medicare HMO | Admitting: Internal Medicine

## 2017-05-27 ENCOUNTER — Other Ambulatory Visit: Payer: Self-pay | Admitting: Family Medicine

## 2017-05-27 ENCOUNTER — Telehealth: Payer: Self-pay | Admitting: Internal Medicine

## 2017-05-27 DIAGNOSIS — G8929 Other chronic pain: Secondary | ICD-10-CM

## 2017-05-27 DIAGNOSIS — M545 Low back pain: Secondary | ICD-10-CM | POA: Diagnosis not present

## 2017-05-27 DIAGNOSIS — Z808 Family history of malignant neoplasm of other organs or systems: Secondary | ICD-10-CM

## 2017-05-27 DIAGNOSIS — K219 Gastro-esophageal reflux disease without esophagitis: Secondary | ICD-10-CM | POA: Diagnosis not present

## 2017-05-27 DIAGNOSIS — Z85118 Personal history of other malignant neoplasm of bronchus and lung: Secondary | ICD-10-CM

## 2017-05-27 DIAGNOSIS — R0609 Other forms of dyspnea: Secondary | ICD-10-CM

## 2017-05-27 DIAGNOSIS — Z801 Family history of malignant neoplasm of trachea, bronchus and lung: Secondary | ICD-10-CM | POA: Diagnosis not present

## 2017-05-27 DIAGNOSIS — Z87891 Personal history of nicotine dependence: Secondary | ICD-10-CM | POA: Diagnosis not present

## 2017-05-27 DIAGNOSIS — N189 Chronic kidney disease, unspecified: Secondary | ICD-10-CM | POA: Diagnosis not present

## 2017-05-27 DIAGNOSIS — R911 Solitary pulmonary nodule: Secondary | ICD-10-CM

## 2017-05-27 DIAGNOSIS — M961 Postlaminectomy syndrome, not elsewhere classified: Secondary | ICD-10-CM

## 2017-05-27 DIAGNOSIS — C3490 Malignant neoplasm of unspecified part of unspecified bronchus or lung: Secondary | ICD-10-CM

## 2017-05-27 MED ORDER — FENTANYL 50 MCG/HR TD PT72: 50.0000 ug | MEDICATED_PATCH | TRANSDERMAL | 0 refills | Status: DC

## 2017-05-27 MED ORDER — OXYCODONE HCL 30 MG PO TABS: ORAL_TABLET | ORAL | 0 refills | Status: DC

## 2017-05-27 NOTE — Telephone Encounter (Signed)
[name] notified prescriptions are ready to be picked up at the front desk.
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
## 2017-05-27 NOTE — Telephone Encounter (Addendum)
Note left in refill box that Sanjay needs scripts for Fentanyl patch to change every other day.  .  Also requesting refill on Oxycodone.  Note states they are leaving for [place] on Friday  to do some repairs and close on house on Jan 25th.

## 2017-05-27 NOTE — Telephone Encounter (Signed)
Scheduled appt per 1/2 los - central radiology to schedule ct - Gave patient AVS and calender per los.

## 2017-05-29 NOTE — Telephone Encounter (Signed)
pts wife coming in today to pick up scripts

## 2017-06-05 ENCOUNTER — Telehealth: Payer: Self-pay | Admitting: Family Medicine

## 2017-06-05 NOTE — Telephone Encounter (Signed)
Spoke with Paulita Fujita at [employer].  She is requesting [place] Controlled Substance information on Mr. Kantz as well as diagnosis code for pain medications.  She states the MD should write on Rx in future-non-acute pain. [place] Controlled Substance info faxed to [place] at [phone].  She also wanted to make the doctor aware that now when prescribing opioids, you must also provide patient a Rx for Narcan nasal spray.

## 2017-06-05 NOTE — Telephone Encounter (Signed)
Copied from [place] [phone]. Topic: Quick Communication - See Telephone Encounter >> Jun 05, 2017  1:31 PM Robina Ade, Helene Kelp D wrote: CRM for notification. See Telephone encounter for: 06/05/17. Paulita Fujita  from [place] called and would like to talk to Butch Penny CMA about patient medication oxycodone (ROXICODONE) 30 MG immediate release tablet, fentaNYL (DURAGESIC - DOSED MCG/HR) 50 MCG/HR. She can be reached at [phone].

## 2017-06-07 NOTE — Telephone Encounter (Signed)
I appreciate the call. Challenging situation - documented in the chart. To my knowledge, narcan is not required in [place].  Typically we do write on script given [place] ST[place] act requirements. It is certainly possible that I failed to do so in this case. I am attempting to get this patient back into Dr. Hardin Negus, his long-term pain MD of many years.

## 2017-06-08 ENCOUNTER — Telehealth: Payer: Self-pay

## 2017-06-08 ENCOUNTER — Telehealth: Payer: Self-pay | Admitting: Family Medicine

## 2017-06-08 MED ORDER — ALBUTEROL SULFATE HFA 108 (90 BASE) MCG/ACT IN AERS: 2.0000 | INHALATION_SPRAY | Freq: Four times a day (QID) | RESPIRATORY_TRACT | 0 refills | Status: DC | PRN

## 2017-06-08 NOTE — Telephone Encounter (Signed)
Copied from [place] [phone]. Topic: Quick Communication - See Telephone Encounter >> Jun 05, 2017  1:31 PM Robina Ade, Helene Kelp D wrote: CRM for notification. See Telephone encounter for: 06/05/17. Paulita Fujita  from [place] called and would like to talk to Butch Penny CMA about patient medication oxycodone (ROXICODONE) 30 MG immediate release tablet, fentaNYL (DURAGESIC - DOSED MCG/HR) 50 MCG/HR. She can be reached at [phone]. >> Jun 08, 2017 12:15 PM Cleaster Corin, NT wrote: Pt. Wife calling to also see if pt. Can get a refill on albuterol inhaler sent to [facility].   [address], [address] [place] [address]  Phone: [phone] Fax: [phone]

## 2017-06-08 NOTE — Telephone Encounter (Signed)
Spoke with Paulita Fujita at [facility].  She is requesting Rx for Ventolin inhaler.  Rx sent electronically as requested.

## 2017-06-08 NOTE — Telephone Encounter (Signed)
Copied from [place] [phone]. Topic: Quick Communication - See Telephone Encounter >> Jun 05, 2017  1:31 PM Robina Ade, Helene Kelp D wrote: CRM for notification. See Telephone encounter for: 06/05/17. Paulita Fujita  from [place] called and would like to talk to Butch Penny CMA about patient medication oxycodone (ROXICODONE) 30 MG immediate release tablet, fentaNYL (DURAGESIC - DOSED MCG/HR) 50 MCG/HR. She can be reached at [phone]. >> Jun 08, 2017 12:15 PM Cleaster Corin, NT wrote: Pt. Wife calling to also see if pt. Can get a refill on albuterol inhaler sent to [facility].   [place], [address] [place] [address]  Phone: [phone] Fax: [phone]

## 2017-06-26 ENCOUNTER — Ambulatory Visit (HOSPITAL_COMMUNITY)
Admission: RE | Admit: 2017-06-26 | Discharge: 2017-06-26 | Disposition: A | Discharge status: Home / Self Care | Payer: Medicare HMO | Source: Ambulatory Visit | Attending: Internal Medicine | Admitting: Internal Medicine

## 2017-06-26 ENCOUNTER — Inpatient Hospital Stay: Payer: Medicare HMO | Attending: Internal Medicine

## 2017-06-26 ENCOUNTER — Other Ambulatory Visit: Payer: Medicare HMO

## 2017-06-26 DIAGNOSIS — Z85118 Personal history of other malignant neoplasm of bronchus and lung: Secondary | ICD-10-CM | POA: Insufficient documentation

## 2017-06-26 DIAGNOSIS — C3490 Malignant neoplasm of unspecified part of unspecified bronchus or lung: Secondary | ICD-10-CM

## 2017-06-26 DIAGNOSIS — I700 Atherosclerosis of aorta: Secondary | ICD-10-CM | POA: Diagnosis not present

## 2017-06-26 DIAGNOSIS — R911 Solitary pulmonary nodule: Secondary | ICD-10-CM | POA: Insufficient documentation

## 2017-06-26 DIAGNOSIS — M545 Low back pain: Secondary | ICD-10-CM | POA: Diagnosis not present

## 2017-06-26 DIAGNOSIS — G8929 Other chronic pain: Secondary | ICD-10-CM | POA: Diagnosis not present

## 2017-06-26 DIAGNOSIS — R918 Other nonspecific abnormal finding of lung field: Secondary | ICD-10-CM | POA: Diagnosis not present

## 2017-06-26 LAB — CBC WITH DIFFERENTIAL/PLATELET
Basophils Absolute: 0 10*3/uL (ref 0.0–0.1)
Basophils Relative: 0 %
EOS ABS: 0.2 10*3/uL (ref 0.0–0.5)
Eosinophils Relative: 2 %
HEMATOCRIT: 51.7 % — AB (ref 38.4–49.9)
HEMOGLOBIN: 16.9 g/dL (ref 13.0–17.1)
LYMPHS ABS: 4.6 10*3/uL — AB (ref 0.9–3.3)
Lymphocytes Relative: 39 %
MCH: 30.3 pg (ref 27.2–33.4)
MCHC: 32.7 g/dL (ref 32.0–36.0)
MCV: 92.8 fL (ref 79.3–98.0)
MONOS PCT: 11 %
Monocytes Absolute: 1.3 10*3/uL — ABNORMAL HIGH (ref 0.1–0.9)
NEUTROS ABS: 5.6 10*3/uL (ref 1.5–6.5)
NEUTROS PCT: 48 %
Platelets: 252 10*3/uL (ref 140–400)
RBC: 5.57 MIL/uL (ref 4.20–5.82)
RDW: 14.5 % (ref 11.0–14.6)
WBC: 11.8 10*3/uL — AB (ref 4.0–10.3)

## 2017-06-26 LAB — COMPREHENSIVE METABOLIC PANEL
ALT: 7 U/L (ref 0–55)
ANION GAP: 9 (ref 3–11)
AST: 19 U/L (ref 5–34)
Albumin: 3.8 g/dL (ref 3.5–5.0)
Alkaline Phosphatase: 106 U/L (ref 40–150)
BUN: 20 mg/dL (ref 7–26)
CHLORIDE: 102 mmol/L (ref 98–109)
CO2: 28 mmol/L (ref 22–29)
CREATININE: 1.76 mg/dL — AB (ref 0.70–1.30)
Calcium: 9.4 mg/dL (ref 8.4–10.4)
GFR, EST AFRICAN AMERICAN: 45 mL/min — AB (ref >60)
GFR, EST NON AFRICAN AMERICAN: 39 mL/min — AB (ref >60)
Glucose, Bld: 119 mg/dL (ref 70–140)
POTASSIUM: 4.6 mmol/L (ref 3.5–5.1)
SODIUM: 139 mmol/L (ref 136–145)
Total Bilirubin: 0.5 mg/dL (ref 0.2–1.2)
Total Protein: 7.3 g/dL (ref 6.4–8.3)

## 2017-06-26 MED ORDER — IOPAMIDOL (ISOVUE-300) INJECTION 61%
INTRAVENOUS | Status: AC
  Filled 2017-06-26: qty 75

## 2017-06-26 MED ORDER — IOPAMIDOL (ISOVUE-300) INJECTION 61%
75.0000 mL | Freq: Once | INTRAVENOUS | Status: AC | PRN
  Administered 2017-06-26: 50 mL via INTRAVENOUS

## 2017-06-29 ENCOUNTER — Encounter: Payer: Self-pay | Admitting: Internal Medicine

## 2017-06-29 ENCOUNTER — Inpatient Hospital Stay (HOSPITAL_BASED_OUTPATIENT_CLINIC_OR_DEPARTMENT_OTHER): Payer: Medicare HMO | Admitting: Internal Medicine

## 2017-06-29 ENCOUNTER — Telehealth: Payer: Self-pay | Admitting: Internal Medicine

## 2017-06-29 DIAGNOSIS — G8929 Other chronic pain: Secondary | ICD-10-CM | POA: Diagnosis not present

## 2017-06-29 DIAGNOSIS — R911 Solitary pulmonary nodule: Secondary | ICD-10-CM | POA: Diagnosis not present

## 2017-06-29 DIAGNOSIS — Z85118 Personal history of other malignant neoplasm of bronchus and lung: Secondary | ICD-10-CM | POA: Diagnosis not present

## 2017-06-29 DIAGNOSIS — M545 Low back pain: Secondary | ICD-10-CM | POA: Diagnosis not present

## 2017-06-29 DIAGNOSIS — C3490 Malignant neoplasm of unspecified part of unspecified bronchus or lung: Secondary | ICD-10-CM

## 2017-06-29 NOTE — Telephone Encounter (Signed)
Gave patient avs and calendar with appts per 2/4 sch msg
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
## 2017-07-02 DIAGNOSIS — R030 Elevated blood-pressure reading, without diagnosis of hypertension: Secondary | ICD-10-CM | POA: Diagnosis not present

## 2017-07-02 DIAGNOSIS — M4316 Spondylolisthesis, lumbar region: Secondary | ICD-10-CM | POA: Diagnosis not present

## 2017-07-02 DIAGNOSIS — M48062 Spinal stenosis, lumbar region with neurogenic claudication: Secondary | ICD-10-CM | POA: Diagnosis not present

## 2017-07-06 ENCOUNTER — Telehealth: Payer: Self-pay | Admitting: Family Medicine

## 2017-07-06 ENCOUNTER — Other Ambulatory Visit: Payer: Self-pay | Admitting: Neurological Surgery

## 2017-07-06 NOTE — Telephone Encounter (Signed)
He will need to come into the office based on [place] STOP Act laws.

## 2017-07-06 NOTE — Telephone Encounter (Signed)
[name] notified by telephone that [name] is going to need an office visit prior to refills on his pain medications due to [place] Stop Act.   She states she is getting ready to take [name] to the ER for excruciating back pain.  She is aware that no pain medications will be refilled by Dr. Lorelei Pont without an appointment first.

## 2017-07-06 NOTE — Telephone Encounter (Signed)
Copied from [place]. Topic: Quick Communication - See Telephone Encounter >> Jul 06, 2017 12:06 PM Vernona Rieger wrote: CRM for notification. See Telephone encounter for:   07/06/17.   oxycodone (ROXICODONE) 30 MG immediate release tablet (45 tablets) fentaNYL (DURAGESIC - DOSED MCG/HR) 50 MCG/HR (3 boxes)  Call when ready for pick up [phone]

## 2017-07-06 NOTE — Telephone Encounter (Signed)
Last refill 05/27/17 #45 Last office visit  05/06/17

## 2017-07-07 ENCOUNTER — Other Ambulatory Visit: Payer: Self-pay | Admitting: *Deleted

## 2017-07-07 ENCOUNTER — Other Ambulatory Visit: Payer: Self-pay | Admitting: Family Medicine

## 2017-07-07 MED ORDER — ROSUVASTATIN CALCIUM 20 MG PO TABS: 20.0000 mg | ORAL_TABLET | Freq: Every day | ORAL | 3 refills | Status: DC

## 2017-07-07 NOTE — Telephone Encounter (Signed)
Received fax from [facility] requesting refills on Lovastatin. Rosuvastatin and clonazepam.  Lovastatin and rosuvastatin is not on medication list.  Clonazepam last refilled 05/06/2017 for #60 with 3 refills.

## 2017-07-08 ENCOUNTER — Other Ambulatory Visit: Payer: Self-pay | Admitting: Family Medicine

## 2017-07-08 ENCOUNTER — Encounter: Payer: Self-pay | Admitting: Internal Medicine

## 2017-07-08 ENCOUNTER — Telehealth: Payer: Self-pay | Admitting: Internal Medicine

## 2017-07-08 ENCOUNTER — Ambulatory Visit: Payer: Medicare HMO | Admitting: Internal Medicine

## 2017-07-08 VITALS — BP 128/85 | HR 69 | Ht 70.0 in | Wt 163.0 lb

## 2017-07-08 DIAGNOSIS — I2510 Atherosclerotic heart disease of native coronary artery without angina pectoris: Secondary | ICD-10-CM

## 2017-07-08 DIAGNOSIS — I10 Essential (primary) hypertension: Secondary | ICD-10-CM

## 2017-07-08 DIAGNOSIS — R0602 Shortness of breath: Secondary | ICD-10-CM

## 2017-07-08 DIAGNOSIS — I25810 Atherosclerosis of coronary artery bypass graft(s) without angina pectoris: Secondary | ICD-10-CM

## 2017-07-08 DIAGNOSIS — Z01810 Encounter for preprocedural cardiovascular examination: Secondary | ICD-10-CM

## 2017-07-08 DIAGNOSIS — E785 Hyperlipidemia, unspecified: Secondary | ICD-10-CM | POA: Diagnosis not present

## 2017-07-08 DIAGNOSIS — M5416 Radiculopathy, lumbar region: Secondary | ICD-10-CM

## 2017-07-08 NOTE — Telephone Encounter (Signed)
   [facility] Pre-operative Risk Assessment    Request for surgical clearance:  1. What type of surgery is being performed? Bilateral L4-5 Laminectomy/Foraminotomy  2. When is this surgery scheduled? 07/10/2017  3. What type of clearance is required (medical clearance vs. Pharmacy clearance to hold med vs. Both)? Medical and pharmacy  4. Are there any medications that need to be held prior to surgery and how long?Plavix  5. Practice name and name of physician performing surgery? [facility] Dr. Kristeen Miss 6.  7. What is your office phone and fax number? [phone] Janett Billow), fax not listed  8. Anesthesia type (None, local, MAC, general) ? Not listed   Lucienne Minks 07/08/2017, 2:43 PM  _________________________________________________________________   (provider comments below)
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
## 2017-07-08 NOTE — Patient Instructions (Addendum)
Medication Instructions:  Your physician recommends that you continue on your current medications as directed. Please refer to the Current Medication list given to you today.   Labwork: none  Testing/Procedures: Your physician has requested that you have a lexiscan myoview. For further information please visit [email or web address]. Please follow instruction sheet, as given.  [place]  Your caregiver has ordered a Stress Test with nuclear imaging. The purpose of this test is to evaluate the blood supply to your heart muscle. This procedure is referred to as a "Non-Invasive Stress Test." This is because other than having an IV started in your vein, nothing is inserted or "invades" your body. Cardiac stress tests are done to find areas of poor blood flow to the heart by determining the extent of coronary artery disease (CAD). Some patients exercise on a treadmill, which naturally increases the blood flow to your heart, while others who are  unable to walk on a treadmill due to physical limitations have a pharmacologic/chemical stress agent called Lexiscan . This medicine will mimic walking on a treadmill by temporarily increasing your coronary blood flow.   Please note: these test may take anywhere between 2-4 hours to complete  PLEASE REPORT TO [place] AT THE FIRST DESK WILL DIRECT YOU WHERE TO GO  Date of Procedure:______02/14/19_________  Arrival Time for Procedure:______09:15 am__________    PLEASE NOTIFY THE OFFICE AT LEAST 24 HOURS IN ADVANCE IF YOU ARE UNABLE TO KEEP YOUR APPOINTMENT.  [phone] AND  PLEASE NOTIFY NUCLEAR MEDICINE AT [facility] AT LEAST 24 HOURS IN ADVANCE IF YOU ARE UNABLE TO KEEP YOUR APPOINTMENT. [phone]  How to prepare for your Myoview test:  1. Do not eat or drink after midnight 2. No caffeine for 24 hours prior to test 3. No smoking 24 hours prior to test. 4. Your medication may be taken with water.  If your doctor  stopped a medication because of this test, do not take that medication. 5. Ladies, please do not wear dresses.  Skirts or pants are appropriate. Please wear a short sleeve shirt. 6. No perfume, cologne or lotion. 7. Wear comfortable walking shoes. No heels!     Follow-Up: Your physician recommends that you schedule a follow-up appointment in: 3 MONTHS WITH DR END.    Any Other Special Instructions Will Be Listed Below (If Applicable).     If you need a refill on your cardiac medications before your next appointment, please call your pharmacy.  Cardiac Nuclear Scan A cardiac nuclear scan is a test that measures blood flow to the heart when a person is resting and when he or she is exercising. The test looks for problems such as:  Not enough blood reaching a portion of the heart.  The heart muscle not working normally.  You may need this test if:  You have heart disease.  You have had abnormal lab results.  You have had heart surgery or angioplasty.  You have chest pain.  You have shortness of breath.  In this test, a radioactive dye (tracer) is injected into your bloodstream. After the tracer has traveled to your heart, an imaging device is used to measure how much of the tracer is absorbed by or distributed to various areas of your heart. This procedure is usually done at a hospital and takes 2-4 hours. Tell a health care provider about:  Any allergies you have.  All medicines you are taking, including vitamins, herbs, eye drops, creams, and over-the-counter medicines.  Any problems you or family members have had with the use of anesthetic medicines.  Any blood disorders you have.  Any surgeries you have had.  Any medical conditions you have.  Whether you are pregnant or may be pregnant. What are the risks? Generally, this is a safe procedure. However, problems may occur, including:  Serious chest pain and heart attack. This is only a risk if the stress portion  of the test is done.  Rapid heartbeat.  Sensation of warmth in your chest. This usually passes quickly.  What happens before the procedure?  Ask your health care provider about changing or stopping your regular medicines. This is especially important if you are taking diabetes medicines or blood thinners.  Remove your jewelry on the day of the procedure. What happens during the procedure?  An IV tube will be inserted into one of your veins.  Your health care provider will inject a small amount of radioactive tracer through the tube.  You will wait for 20-40 minutes while the tracer travels through your bloodstream.  Your heart activity will be monitored with an electrocardiogram (ECG).  You will lie down on an exam table.  Images of your heart will be taken for about 15-20 minutes.  You may be asked to exercise on a treadmill or stationary bike. While you exercise, your heart's activity will be monitored with an ECG, and your blood pressure will be checked. If you are unable to exercise, you may be given a medicine to increase blood flow to parts of your heart.  When blood flow to your heart has peaked, a tracer will again be injected through the IV tube.  After 20-40 minutes, you will get back on the exam table and have more images taken of your heart.  When the procedure is over, your IV tube will be removed. The procedure may vary among health care providers and hospitals. Depending on the type of tracer used, scans may need to be repeated 3-4 hours later. What happens after the procedure?  Unless your health care provider tells you otherwise, you may return to your normal schedule, including diet, activities, and medicines.  Unless your health care provider tells you otherwise, you may increase your fluid intake. This will help flush the contrast dye from your body. Drink enough fluid to keep your urine clear or pale yellow.  It is up to you to get your test results. Ask  your health care provider, or the department that is doing the test, when your results will be ready. Summary  A cardiac nuclear scan measures the blood flow to the heart when a person is resting and when he or she is exercising.  You may need this test if you are at risk for heart disease.  Tell your health care provider if you are pregnant.  Unless your health care provider tells you otherwise, increase your fluid intake. This will help flush the contrast dye from your body. Drink enough fluid to keep your urine clear or pale yellow. This information is not intended to replace advice given to you by your health care provider. Make sure you discuss any questions you have with your health care provider. Document Released: 06/06/2004 Document Revised: 05/14/2016 Document Reviewed: 04/20/2013 Elsevier Interactive Patient Education  2017 [employer].

## 2017-07-09 ENCOUNTER — Encounter (HOSPITAL_COMMUNITY): Payer: Self-pay | Admitting: *Deleted

## 2017-07-09 ENCOUNTER — Encounter
Admission: RE | Admit: 2017-07-09 | Discharge: 2017-07-09 | Disposition: A | Discharge status: Home / Self Care | Payer: Medicare HMO | Source: Ambulatory Visit | Attending: Internal Medicine | Admitting: Internal Medicine

## 2017-07-09 ENCOUNTER — Other Ambulatory Visit: Payer: Self-pay

## 2017-07-09 ENCOUNTER — Telehealth: Payer: Self-pay | Admitting: *Deleted

## 2017-07-09 ENCOUNTER — Ambulatory Visit: Payer: Medicare HMO | Admitting: Family Medicine

## 2017-07-09 DIAGNOSIS — I2510 Atherosclerotic heart disease of native coronary artery without angina pectoris: Secondary | ICD-10-CM | POA: Diagnosis not present

## 2017-07-09 DIAGNOSIS — Z01810 Encounter for preprocedural cardiovascular examination: Secondary | ICD-10-CM | POA: Diagnosis not present

## 2017-07-09 DIAGNOSIS — R0602 Shortness of breath: Secondary | ICD-10-CM | POA: Diagnosis not present

## 2017-07-09 LAB — NM MYOCAR MULTI W/SPECT W/WALL MOTION / EF
CHL CUP NUCLEAR SSS: 0
CHL CUP RESTING HR STRESS: 73 {beats}/min
CSEPHR: 58 %
LV sys vol: 13 mL
LVDIAVOL: 42 mL (ref 62–150)
Peak HR: 90 {beats}/min
SDS: 0
SRS: 4
TID: 1.04

## 2017-07-09 MED ORDER — REGADENOSON 0.4 MG/5ML IV SOLN
0.4000 mg | Freq: Once | INTRAVENOUS | Status: AC
  Administered 2017-07-09: 0.4 mg via INTRAVENOUS

## 2017-07-09 MED ORDER — TECHNETIUM TC 99M TETROFOSMIN IV KIT
30.7600 | PACK | Freq: Once | INTRAVENOUS | Status: AC | PRN
  Administered 2017-07-09: 30.76 via INTRAVENOUS

## 2017-07-09 MED ORDER — ASPIRIN EC 81 MG PO TBEC: 81.0000 mg | DELAYED_RELEASE_TABLET | Freq: Every day | ORAL | 3 refills | Status: DC

## 2017-07-09 MED ORDER — TECHNETIUM TC 99M TETROFOSMIN IV KIT
12.8200 | PACK | Freq: Once | INTRAVENOUS | Status: AC | PRN
  Administered 2017-07-09: 12.82 via INTRAVENOUS

## 2017-07-09 NOTE — Progress Notes (Signed)
[name] Salvo, RN at Dr. Darnelle Bos office and requested copies of cath report and other cardiac studies that she has gotten from pt's previous physicians in [place].

## 2017-07-09 NOTE — Telephone Encounter (Signed)
Awaiting results of myoview.

## 2017-07-09 NOTE — Telephone Encounter (Signed)
Notified Clarene Critchley in preadmit that patient cleared for surgery and will start aspirin 81 mg daily.

## 2017-07-09 NOTE — Telephone Encounter (Signed)
Received call from [place] at [place] at [facility].  She request I fax over the records we have received for patient to [phone].

## 2017-07-09 NOTE — Telephone Encounter (Signed)
S/w employee at Dr Jerl Santos office in [place].  I expressed the urgency of request as patient is to have surgery tomorrow. She will fax me over patient's most recent echo, office visit notes and stress test and any other information they have. Release signed by patient faxed to her at [phone].

## 2017-07-09 NOTE — Telephone Encounter (Signed)
Patient had heart cath done by Dr Myrtha Mantis in [place], [place]. Called his office and they will fax over the cath report.   Cath report already received.  Awaiting records from Dr Leanora Ivanoff.

## 2017-07-09 NOTE — Telephone Encounter (Signed)
Records were received earlier today and faxed to Dr End for review.

## 2017-07-09 NOTE — Telephone Encounter (Signed)
Myoview today was low risk. I have reviewed outside records from [place], which detail a complex PCI with orbital atherectomy of the ostial LAD and double-barrel stenting from the LMCA into the proximal LAD and proximal LCx. A stent was also placed in the mid LCx at that time. LIMA was not patent.  I think it is reasonable for [name] to proceed with surgery tomorrow, though he is at elevated cardiac risk. I recommend that [name] begin taking aspirin 81 mg daily today. I have spoken with Dr. Ellene Route and he is okay with this. Clopidogrel should be restarted as soon as possible after spine surgery.  [name], MD [facility] Pager: [phone].

## 2017-07-09 NOTE — Progress Notes (Signed)
Spoke with pt's wife, [name] for pre-op call. Pt has extensive cardiac history, has had CABG in 2008 and stents approx 3 years ago. Pt saw Dr. Barfoot Revel yesterday for cardiac clearance and had a stress test this morning. Results are pending. Clearance is pending at this time. Mrs. Wiacek states pt has not c/o any chest pain or sob recently. Pt's last dose of Plavix was 2 weeks ago (he ran out) and Aspirin last dose was 07/02/17.

## 2017-07-09 NOTE — Telephone Encounter (Signed)
S/w patient's wife, ok per DPR.  She verbalized understanding that patient is to start aspirin 81 mg by mouth daily starting today and that he was cleared for surgery. She verbalized understanding that myoview was low risk.

## 2017-07-10 ENCOUNTER — Ambulatory Visit (HOSPITAL_COMMUNITY)
Admission: RE | Disposition: A | Discharge status: Home / Self Care | Payer: Self-pay | Source: Ambulatory Visit | Attending: Neurological Surgery

## 2017-07-10 ENCOUNTER — Observation Stay (HOSPITAL_COMMUNITY)
Admission: RE | Admit: 2017-07-10 | Discharge: 2017-07-11 | Disposition: A | Discharge status: Home / Self Care | Payer: Medicare HMO | Source: Ambulatory Visit | Attending: Neurological Surgery | Admitting: Neurological Surgery

## 2017-07-10 ENCOUNTER — Ambulatory Visit (HOSPITAL_COMMUNITY): Payer: Medicare HMO | Admitting: Certified Registered Nurse Anesthetist

## 2017-07-10 ENCOUNTER — Encounter (HOSPITAL_COMMUNITY): Payer: Self-pay | Admitting: *Deleted

## 2017-07-10 ENCOUNTER — Other Ambulatory Visit: Payer: Self-pay | Admitting: Family Medicine

## 2017-07-10 ENCOUNTER — Observation Stay (HOSPITAL_COMMUNITY): Payer: Medicare HMO

## 2017-07-10 DIAGNOSIS — Z85118 Personal history of other malignant neoplasm of bronchus and lung: Secondary | ICD-10-CM | POA: Insufficient documentation

## 2017-07-10 DIAGNOSIS — I129 Hypertensive chronic kidney disease with stage 1 through stage 4 chronic kidney disease, or unspecified chronic kidney disease: Secondary | ICD-10-CM | POA: Diagnosis not present

## 2017-07-10 DIAGNOSIS — Z902 Acquired absence of lung [part of]: Secondary | ICD-10-CM | POA: Insufficient documentation

## 2017-07-10 DIAGNOSIS — Z981 Arthrodesis status: Secondary | ICD-10-CM | POA: Diagnosis not present

## 2017-07-10 DIAGNOSIS — R911 Solitary pulmonary nodule: Secondary | ICD-10-CM | POA: Diagnosis not present

## 2017-07-10 DIAGNOSIS — M5416 Radiculopathy, lumbar region: Secondary | ICD-10-CM | POA: Diagnosis not present

## 2017-07-10 DIAGNOSIS — I2510 Atherosclerotic heart disease of native coronary artery without angina pectoris: Secondary | ICD-10-CM | POA: Insufficient documentation

## 2017-07-10 DIAGNOSIS — M48062 Spinal stenosis, lumbar region with neurogenic claudication: Secondary | ICD-10-CM | POA: Diagnosis not present

## 2017-07-10 DIAGNOSIS — Z885 Allergy status to narcotic agent status: Secondary | ICD-10-CM | POA: Insufficient documentation

## 2017-07-10 DIAGNOSIS — Z7982 Long term (current) use of aspirin: Secondary | ICD-10-CM | POA: Insufficient documentation

## 2017-07-10 DIAGNOSIS — Z419 Encounter for procedure for purposes other than remedying health state, unspecified: Secondary | ICD-10-CM

## 2017-07-10 DIAGNOSIS — Z79899 Other long term (current) drug therapy: Secondary | ICD-10-CM | POA: Insufficient documentation

## 2017-07-10 DIAGNOSIS — C3490 Malignant neoplasm of unspecified part of unspecified bronchus or lung: Secondary | ICD-10-CM | POA: Diagnosis not present

## 2017-07-10 DIAGNOSIS — N184 Chronic kidney disease, stage 4 (severe): Secondary | ICD-10-CM | POA: Diagnosis not present

## 2017-07-10 HISTORY — DX: Sleep apnea, unspecified: G47.30

## 2017-07-10 HISTORY — DX: Pneumonia, unspecified organism: J18.9

## 2017-07-10 HISTORY — DX: Anxiety disorder, unspecified: F41.9

## 2017-07-10 HISTORY — DX: Unspecified convulsions: R56.9

## 2017-07-10 HISTORY — PX: LUMBAR LAMINECTOMY/DECOMPRESSION MICRODISCECTOMY: SHX5026

## 2017-07-10 SURGERY — LUMBAR LAMINECTOMY/DECOMPRESSION MICRODISCECTOMY 1 LEVEL
Anesthesia: General | Site: Back | Laterality: Bilateral

## 2017-07-10 MED ORDER — ROCURONIUM BROMIDE 100 MG/10ML IV SOLN
INTRAVENOUS | Status: DC | PRN
  Administered 2017-07-10: 50 mg via INTRAVENOUS

## 2017-07-10 MED ORDER — SODIUM CHLORIDE 0.9% FLUSH: 3.0000 mL | INTRAVENOUS | Status: DC | PRN

## 2017-07-10 MED ORDER — LIDOCAINE-EPINEPHRINE 1 %-1:100000 IJ SOLN
INTRAMUSCULAR | Status: AC
  Filled 2017-07-10: qty 1

## 2017-07-10 MED ORDER — VITAMIN B-12 5000 MCG SL SUBL: 5000.0000 ug | SUBLINGUAL_TABLET | Freq: Every day | SUBLINGUAL | Status: DC

## 2017-07-10 MED ORDER — CEFAZOLIN SODIUM-DEXTROSE 2-4 GM/100ML-% IV SOLN
INTRAVENOUS | Status: AC
  Filled 2017-07-10: qty 100

## 2017-07-10 MED ORDER — DOCUSATE SODIUM 100 MG PO CAPS
100.0000 mg | ORAL_CAPSULE | Freq: Two times a day (BID) | ORAL | Status: DC
  Administered 2017-07-10 – 2017-07-11 (×2): 100 mg via ORAL
  Filled 2017-07-10 (×2): qty 1

## 2017-07-10 MED ORDER — ONDANSETRON HCL 4 MG/2ML IJ SOLN
INTRAMUSCULAR | Status: DC | PRN
  Administered 2017-07-10: 4 mg via INTRAVENOUS

## 2017-07-10 MED ORDER — BUPIVACAINE HCL (PF) 0.5 % IJ SOLN
INTRAMUSCULAR | Status: AC
  Filled 2017-07-10: qty 30

## 2017-07-10 MED ORDER — OXYCODONE HCL 5 MG PO TABS: 5.0000 mg | ORAL_TABLET | Freq: Once | ORAL | Status: DC | PRN

## 2017-07-10 MED ORDER — BISACODYL 10 MG RE SUPP: 10.0000 mg | Freq: Every day | RECTAL | Status: DC | PRN

## 2017-07-10 MED ORDER — ACETAMINOPHEN 650 MG RE SUPP: 650.0000 mg | RECTAL | Status: DC | PRN

## 2017-07-10 MED ORDER — PANTOPRAZOLE SODIUM 20 MG PO TBEC
20.0000 mg | DELAYED_RELEASE_TABLET | Freq: Every day | ORAL | Status: DC
  Administered 2017-07-10 – 2017-07-11 (×2): 20 mg via ORAL
  Filled 2017-07-10 (×2): qty 1

## 2017-07-10 MED ORDER — LACTATED RINGERS IV SOLN
INTRAVENOUS | Status: DC | PRN
  Administered 2017-07-10 (×2): via INTRAVENOUS

## 2017-07-10 MED ORDER — PHENOL 1.4 % MT LIQD: 1.0000 | OROMUCOSAL | Status: DC | PRN

## 2017-07-10 MED ORDER — ONDANSETRON HCL 4 MG PO TABS: 4.0000 mg | ORAL_TABLET | Freq: Four times a day (QID) | ORAL | Status: DC | PRN

## 2017-07-10 MED ORDER — HYDROMORPHONE HCL 1 MG/ML IJ SOLN: 0.2500 mg | INTRAMUSCULAR | Status: DC | PRN

## 2017-07-10 MED ORDER — CLONAZEPAM 0.5 MG PO TABS
0.5000 mg | ORAL_TABLET | Freq: Two times a day (BID) | ORAL | Status: DC | PRN
  Administered 2017-07-11: 0.5 mg via ORAL
  Filled 2017-07-10: qty 1

## 2017-07-10 MED ORDER — THROMBIN (RECOMBINANT) 5000 UNITS EX SOLR
CUTANEOUS | Status: DC | PRN
  Administered 2017-07-10 (×2): 5000 [IU] via TOPICAL

## 2017-07-10 MED ORDER — ALUM & MAG HYDROXIDE-SIMETH 200-200-20 MG/5ML PO SUSP: 30.0000 mL | Freq: Four times a day (QID) | ORAL | Status: DC | PRN

## 2017-07-10 MED ORDER — SUGAMMADEX SODIUM 200 MG/2ML IV SOLN
INTRAVENOUS | Status: DC | PRN
  Administered 2017-07-10: 147.8 mg via INTRAVENOUS

## 2017-07-10 MED ORDER — ALBUTEROL SULFATE HFA 108 (90 BASE) MCG/ACT IN AERS: 2.0000 | INHALATION_SPRAY | Freq: Four times a day (QID) | RESPIRATORY_TRACT | Status: DC | PRN

## 2017-07-10 MED ORDER — SODIUM CHLORIDE 0.9 % IV SOLN: 250.0000 mL | INTRAVENOUS | Status: DC

## 2017-07-10 MED ORDER — OXYCODONE HCL 5 MG PO TABS
10.0000 mg | ORAL_TABLET | ORAL | Status: DC | PRN
  Administered 2017-07-10 – 2017-07-11 (×5): 10 mg via ORAL
  Filled 2017-07-10 (×5): qty 2

## 2017-07-10 MED ORDER — THROMBIN (RECOMBINANT) 5000 UNITS EX SOLR
OROMUCOSAL | Status: DC | PRN
  Administered 2017-07-10: 17:00:00 via TOPICAL

## 2017-07-10 MED ORDER — MORPHINE SULFATE (PF) 4 MG/ML IV SOLN
4.0000 mg | INTRAVENOUS | Status: DC | PRN
  Filled 2017-07-10: qty 1

## 2017-07-10 MED ORDER — CEFAZOLIN SODIUM-DEXTROSE 2-4 GM/100ML-% IV SOLN
2.0000 g | INTRAVENOUS | Status: AC
  Administered 2017-07-10: 2 g via INTRAVENOUS

## 2017-07-10 MED ORDER — OXYCODONE HCL 5 MG PO TABS: 5.0000 mg | ORAL_TABLET | ORAL | Status: DC | PRN

## 2017-07-10 MED ORDER — CHLORHEXIDINE GLUCONATE CLOTH 2 % EX PADS: 6.0000 | MEDICATED_PAD | Freq: Once | CUTANEOUS | Status: DC

## 2017-07-10 MED ORDER — MIDAZOLAM HCL 5 MG/5ML IJ SOLN
INTRAMUSCULAR | Status: DC | PRN
  Administered 2017-07-10: 2 mg via INTRAVENOUS

## 2017-07-10 MED ORDER — LIDOCAINE HCL (CARDIAC) 20 MG/ML IV SOLN
INTRAVENOUS | Status: DC | PRN
  Administered 2017-07-10: 30 mg via INTRAVENOUS

## 2017-07-10 MED ORDER — LACTATED RINGERS IV SOLN
INTRAVENOUS | Status: DC
Start: 2017-07-10 — End: 2017-07-11
  Administered 2017-07-10: 50 mL/h via INTRAVENOUS

## 2017-07-10 MED ORDER — MENTHOL 3 MG MT LOZG: 1.0000 | LOZENGE | OROMUCOSAL | Status: DC | PRN

## 2017-07-10 MED ORDER — POLYETHYLENE GLYCOL 3350 17 G PO PACK: 17.0000 g | PACK | Freq: Every day | ORAL | Status: DC | PRN

## 2017-07-10 MED ORDER — ONDANSETRON HCL 4 MG/2ML IJ SOLN: 4.0000 mg | Freq: Four times a day (QID) | INTRAMUSCULAR | Status: DC | PRN

## 2017-07-10 MED ORDER — SENNA 8.6 MG PO TABS
1.0000 | ORAL_TABLET | Freq: Two times a day (BID) | ORAL | Status: DC
  Administered 2017-07-10 – 2017-07-11 (×2): 8.6 mg via ORAL
  Filled 2017-07-10 (×2): qty 1

## 2017-07-10 MED ORDER — FENTANYL CITRATE (PF) 100 MCG/2ML IJ SOLN
INTRAMUSCULAR | Status: DC | PRN
  Administered 2017-07-10: 150 ug via INTRAVENOUS
  Administered 2017-07-10 (×2): 100 ug via INTRAVENOUS

## 2017-07-10 MED ORDER — FENTANYL CITRATE (PF) 250 MCG/5ML IJ SOLN
INTRAMUSCULAR | Status: AC
  Filled 2017-07-10: qty 5

## 2017-07-10 MED ORDER — LIDOCAINE-EPINEPHRINE 1 %-1:100000 IJ SOLN
INTRAMUSCULAR | Status: DC | PRN
  Administered 2017-07-10: 5 mL

## 2017-07-10 MED ORDER — BUPIVACAINE HCL (PF) 0.5 % IJ SOLN
INTRAMUSCULAR | Status: DC | PRN
  Administered 2017-07-10: 5 mL
  Administered 2017-07-10: 20 mL

## 2017-07-10 MED ORDER — AMPHETAMINE-DEXTROAMPHETAMINE 10 MG PO TABS: 20.0000 mg | ORAL_TABLET | Freq: Two times a day (BID) | ORAL | Status: DC

## 2017-07-10 MED ORDER — TIZANIDINE HCL 4 MG PO TABS
2.0000 mg | ORAL_TABLET | Freq: Four times a day (QID) | ORAL | Status: DC | PRN
  Administered 2017-07-11: 2 mg via ORAL
  Filled 2017-07-10: qty 1

## 2017-07-10 MED ORDER — VITAMIN D 1000 UNITS PO TABS
1000.0000 [IU] | ORAL_TABLET | Freq: Every day | ORAL | Status: DC
  Administered 2017-07-10 – 2017-07-11 (×2): 1000 [IU] via ORAL
  Filled 2017-07-10 (×2): qty 1

## 2017-07-10 MED ORDER — HEMOSTATIC AGENTS (NO CHARGE) OPTIME
TOPICAL | Status: DC | PRN
  Administered 2017-07-10: 1 via TOPICAL

## 2017-07-10 MED ORDER — ACETAMINOPHEN 325 MG PO TABS: 650.0000 mg | ORAL_TABLET | ORAL | Status: DC | PRN

## 2017-07-10 MED ORDER — PROPOFOL 10 MG/ML IV BOLUS
INTRAVENOUS | Status: AC
  Filled 2017-07-10: qty 20

## 2017-07-10 MED ORDER — FLEET ENEMA 7-19 GM/118ML RE ENEM: 1.0000 | ENEMA | Freq: Once | RECTAL | Status: DC | PRN

## 2017-07-10 MED ORDER — FENTANYL 25 MCG/HR TD PT72
50.0000 ug | MEDICATED_PATCH | TRANSDERMAL | Status: DC
Start: 2017-07-12 — End: 2017-07-11

## 2017-07-10 MED ORDER — VITAMIN B-12 1000 MCG PO TABS
5000.0000 ug | ORAL_TABLET | Freq: Every day | ORAL | Status: DC
  Administered 2017-07-11: 5000 ug via ORAL
  Filled 2017-07-10: qty 5

## 2017-07-10 MED ORDER — CITALOPRAM HYDROBROMIDE 20 MG PO TABS
20.0000 mg | ORAL_TABLET | Freq: Every day | ORAL | Status: DC
  Administered 2017-07-11: 20 mg via ORAL
  Filled 2017-07-10: qty 1

## 2017-07-10 MED ORDER — ACYCLOVIR 400 MG PO TABS
400.0000 mg | ORAL_TABLET | Freq: Every day | ORAL | Status: DC
  Administered 2017-07-10 – 2017-07-11 (×2): 400 mg via ORAL
  Filled 2017-07-10 (×2): qty 1

## 2017-07-10 MED ORDER — 0.9 % SODIUM CHLORIDE (POUR BTL) OPTIME
TOPICAL | Status: DC | PRN
  Administered 2017-07-10: 1000 mL

## 2017-07-10 MED ORDER — GUAIFENESIN 200 MG PO TABS
400.0000 mg | ORAL_TABLET | Freq: Two times a day (BID) | ORAL | Status: DC
  Administered 2017-07-10 – 2017-07-11 (×2): 400 mg via ORAL
  Filled 2017-07-10 (×2): qty 2

## 2017-07-10 MED ORDER — SODIUM CHLORIDE 0.9 % IR SOLN
Status: DC | PRN
  Administered 2017-07-10: 18:00:00

## 2017-07-10 MED ORDER — MIDAZOLAM HCL 2 MG/2ML IJ SOLN
INTRAMUSCULAR | Status: AC
  Filled 2017-07-10: qty 2

## 2017-07-10 MED ORDER — PROPOFOL 10 MG/ML IV BOLUS
INTRAVENOUS | Status: DC | PRN
  Administered 2017-07-10: 5 mg via INTRAVENOUS
  Administered 2017-07-10: 150 mg via INTRAVENOUS

## 2017-07-10 MED ORDER — METHOCARBAMOL 500 MG PO TABS
500.0000 mg | ORAL_TABLET | Freq: Four times a day (QID) | ORAL | Status: DC | PRN
  Administered 2017-07-10 – 2017-07-11 (×2): 500 mg via ORAL
  Filled 2017-07-10 (×2): qty 1

## 2017-07-10 MED ORDER — THROMBIN 5000 UNITS EX SOLR
CUTANEOUS | Status: AC
  Filled 2017-07-10: qty 5000

## 2017-07-10 MED ORDER — SODIUM CHLORIDE 0.9% FLUSH: 3.0000 mL | Freq: Two times a day (BID) | INTRAVENOUS | Status: DC

## 2017-07-10 MED ORDER — THROMBIN 5000 UNITS EX SOLR
CUTANEOUS | Status: AC
  Filled 2017-07-10: qty 10000

## 2017-07-10 MED ORDER — ALBUTEROL SULFATE (2.5 MG/3ML) 0.083% IN NEBU: 2.5000 mg | INHALATION_SOLUTION | Freq: Four times a day (QID) | RESPIRATORY_TRACT | Status: DC | PRN

## 2017-07-10 MED ORDER — NITROGLYCERIN 0.4 MG SL SUBL: 0.4000 mg | SUBLINGUAL_TABLET | SUBLINGUAL | Status: DC | PRN

## 2017-07-10 MED ORDER — ASPIRIN EC 81 MG PO TBEC
81.0000 mg | DELAYED_RELEASE_TABLET | Freq: Every day | ORAL | Status: DC
  Administered 2017-07-11: 81 mg via ORAL
  Filled 2017-07-10: qty 1

## 2017-07-10 MED ORDER — OXYCODONE HCL 5 MG/5ML PO SOLN: 5.0000 mg | Freq: Once | ORAL | Status: DC | PRN

## 2017-07-10 MED ORDER — METHOCARBAMOL 1000 MG/10ML IJ SOLN: 500.0000 mg | Freq: Four times a day (QID) | INTRAVENOUS | Status: DC | PRN

## 2017-07-10 MED ORDER — OXYCODONE HCL 5 MG PO TABS: 30.0000 mg | ORAL_TABLET | Freq: Two times a day (BID) | ORAL | Status: DC | PRN

## 2017-07-10 SURGICAL SUPPLY — 59 items
ADH SKN CLS APL DERMABOND .7 (GAUZE/BANDAGES/DRESSINGS) ×1
BAG DECANTER FOR FLEXI CONT (MISCELLANEOUS) ×2 IMPLANT
BLADE CLIPPER SURG (BLADE) IMPLANT
BUR ACORN 6.0 (BURR) ×1 IMPLANT
BUR MATCHSTICK NEURO 3.0 LAGG (BURR) ×2 IMPLANT
CANISTER SUCT 3000ML PPV (MISCELLANEOUS) ×2 IMPLANT
CARTRIDGE OIL MAESTRO DRILL (MISCELLANEOUS) ×1 IMPLANT
DERMABOND ADVANCED (GAUZE/BANDAGES/DRESSINGS) ×1
DERMABOND ADVANCED .7 DNX12 (GAUZE/BANDAGES/DRESSINGS) ×1 IMPLANT
DEVICE DISSECT PLASMABLAD 3.0S (MISCELLANEOUS) ×1 IMPLANT
DIFFUSER DRILL AIR PNEUMATIC (MISCELLANEOUS) ×2 IMPLANT
DRAPE HALF SHEET 40X57 (DRAPES) IMPLANT
DRAPE LAPAROTOMY T 102X78X121 (DRAPES) ×2 IMPLANT
DRAPE MICROSCOPE LEICA (MISCELLANEOUS) IMPLANT
DRAPE POUCH INSTRU U-SHP 10X18 (DRAPES) ×2 IMPLANT
DRSG OPSITE POSTOP 4X6 (GAUZE/BANDAGES/DRESSINGS) ×1 IMPLANT
DURAPREP 26ML APPLICATOR (WOUND CARE) ×2 IMPLANT
ELECT REM PT RETURN 9FT ADLT (ELECTROSURGICAL) ×2
ELECTRODE REM PT RTRN 9FT ADLT (ELECTROSURGICAL) ×1 IMPLANT
GAUZE SPONGE 4X4 12PLY STRL (GAUZE/BANDAGES/DRESSINGS) ×2 IMPLANT
GAUZE SPONGE 4X4 16PLY XRAY LF (GAUZE/BANDAGES/DRESSINGS) IMPLANT
GLOVE BIOGEL PI IND STRL 6.5 (GLOVE) IMPLANT
GLOVE BIOGEL PI IND STRL 7.0 (GLOVE) IMPLANT
GLOVE BIOGEL PI IND STRL 7.5 (GLOVE) IMPLANT
GLOVE BIOGEL PI IND STRL 8.5 (GLOVE) ×1 IMPLANT
GLOVE BIOGEL PI INDICATOR 6.5 (GLOVE) ×2
GLOVE BIOGEL PI INDICATOR 7.0 (GLOVE) ×2
GLOVE BIOGEL PI INDICATOR 7.5 (GLOVE) ×2
GLOVE BIOGEL PI INDICATOR 8.5 (GLOVE) ×1
GLOVE ECLIPSE 8.5 STRL (GLOVE) ×2 IMPLANT
GLOVE SS N UNI LF 6.0 STRL (GLOVE) ×2 IMPLANT
GLOVE SS N UNI LF 6.5 STRL (GLOVE) ×2 IMPLANT
GLOVE SS N UNI LF 7.0 STRL (GLOVE) ×2 IMPLANT
GOWN STRL REUS W/ TWL LRG LVL3 (GOWN DISPOSABLE) IMPLANT
GOWN STRL REUS W/ TWL XL LVL3 (GOWN DISPOSABLE) IMPLANT
GOWN STRL REUS W/TWL 2XL LVL3 (GOWN DISPOSABLE) ×2 IMPLANT
GOWN STRL REUS W/TWL LRG LVL3 (GOWN DISPOSABLE) ×4
GOWN STRL REUS W/TWL XL LVL3 (GOWN DISPOSABLE)
HEMOSTAT POWDER KIT SURGIFOAM (HEMOSTASIS) ×1 IMPLANT
KIT BASIN OR (CUSTOM PROCEDURE TRAY) ×2 IMPLANT
KIT ROOM TURNOVER OR (KITS) ×2 IMPLANT
NDL SPNL 20GX3.5 QUINCKE YW (NEEDLE) IMPLANT
NEEDLE HYPO 22GX1.5 SAFETY (NEEDLE) ×2 IMPLANT
NEEDLE SPNL 20GX3.5 QUINCKE YW (NEEDLE) IMPLANT
NS IRRIG 1000ML POUR BTL (IV SOLUTION) ×2 IMPLANT
OIL CARTRIDGE MAESTRO DRILL (MISCELLANEOUS) ×2
PACK LAMINECTOMY NEURO (CUSTOM PROCEDURE TRAY) ×2 IMPLANT
PAD ARMBOARD 7.5X6 YLW CONV (MISCELLANEOUS) ×6 IMPLANT
PATTIES SURGICAL .5 X1 (DISPOSABLE) ×2 IMPLANT
PLASMABLADE 3.0S (MISCELLANEOUS) ×2
RUBBERBAND STERILE (MISCELLANEOUS) IMPLANT
SPONGE SURGIFOAM ABS GEL SZ50 (HEMOSTASIS) ×2 IMPLANT
SUT VIC AB 1 CT1 18XBRD ANBCTR (SUTURE) ×1 IMPLANT
SUT VIC AB 1 CT1 8-18 (SUTURE) ×2
SUT VIC AB 2-0 CP2 18 (SUTURE) ×2 IMPLANT
SUT VIC AB 3-0 SH 8-18 (SUTURE) ×2 IMPLANT
TOWEL GREEN STERILE (TOWEL DISPOSABLE) ×2 IMPLANT
TOWEL GREEN STERILE FF (TOWEL DISPOSABLE) ×2 IMPLANT
WATER STERILE IRR 1000ML POUR (IV SOLUTION) ×2 IMPLANT

## 2017-07-10 NOTE — Anesthesia Procedure Notes (Signed)
Date/Time: 07/10/2017 4:24 PM Performed by: Eligha Bridegroom, CRNA Pre-anesthesia Checklist: Patient identified, Emergency Drugs available, Suction available, Patient being monitored and Timeout performed Patient Re-evaluated:Patient Re-evaluated prior to induction Oxygen Delivery Method: Circle system utilized Preoxygenation: Pre-oxygenation with 100% oxygen Induction Type: IV induction Ventilation: Mask ventilation without difficulty and Oral airway inserted - appropriate to patient size Grade View: Grade I Tube type: Oral Tube size: 7.5 mm Number of attempts: 1 Airway Equipment and Method: Stylet Secured at: 22 cm Tube secured with: Tape Dental Injury: Teeth and Oropharynx as per pre-operative assessment

## 2017-07-10 NOTE — H&P (Signed)
CHIEF COMPLAINT: Back pain, bilateral lower extremity pain, and weakness.  HISTORY OF PRESENT ILLNESS: Shahzain is a 67 year old individual whom I had seen and treated in the past for cervical spondylitic radiculopathy and myelopathy. This was over 20 years ago and, back then, Jourdin had significant radiculopathy in the C6 distribution and ultimately required extensive surgical decompression and stabilization. He healed from that process, but, over the years, he has had progressive difficulty with vascular issues in his major vessels and his heart. He has had open-heart surgery on one occasion. He has had lung cancer resected from his right lung with 2 lobes removed at [facility] in 2014. He notes that he has had rotator cuff repairs. He has been having significant problems with back pain that developed bilateral lower extremity pain and weakness now for about a year's time. In March of last year, while in [place], he underwent an MRI of his lumbar spine, and this demonstrated that he had a high-grade stenosis at the level of L4-L5. He also has some spondylitic changes at L5-S1, but he had nearly complete narrowing of his spinal canal at the level of L4-L5.   Today in the office, to further his workup, I obtained a lateral flexion-extension film of the lumbar spine. This demonstrates that he has a very minimal grade 1 spondylolisthesis at L4-L5 that does not change with flexion and extension. The alignment in the coronal plane on the AP view is good.  CURRENT MEDICATION LIST: Includes Acyclovir, Albuterol, Adderall, Aspirin, Atorvastatin, Vitamin D, Citalopram, Clonazepam, Plavix, Vitamin B12, Voltaren, and a Duragesic patch. He also uses some Humibid, Prevacid, Nitrostat, Oxycodone, and Zanaflex.  REVIEW OF SYSTEMS: Notable for some hearing loss, balance disturbance, shortness of breath, leg pain while walking, arthritis, neck pain, leg weakness, back pain, and difficulty with starting and  stopping his urinary stream, on a 14-point review sheet.  PHYSICAL EXAMINATION: I note that Muad stands with a slight forward stoop. He has good strength proximally in the iliopsoas and the quadriceps but he demonstrates weakness in the tibialis anterior in both lower extremities. Gastroc strength appears to be intact. His reflexes are symmetrically depressed.  IMPRESSION: Xavi Tomasik has evidence of severe stenosis at the level of L4-L5. He has degenerative listhesis that appears fairly stable at the L4-L5 level. Given the degree of weakness that he has experienced, I believe that Mina would be best served with a single-level decompression, bilateral laminotomies, and foraminotomies at L4-L5 to give more room for the nerves in his back and radiating down to his legs. I do not believe that he needs a fusion and hope that this is the situation for the long term; though he does have this degenerative process, decompressing the central canal and lateral recesses, this should give him good relief of the worst of his symptoms.   We will plan on scheduling his surgery at the earliest convenience. Given his history of coronary artery disease and pulmonary problems, we will have his surgery done at the hospital with a planned overnight stay.

## 2017-07-10 NOTE — Telephone Encounter (Signed)
Last Rx 05/27/17. Last OV 04/2017. See note below

## 2017-07-10 NOTE — Op Note (Signed)
Date of surgery: 07/10/2017 Preoperative diagnosis:lumbar stenosis L4-5with neurogenic claudication, lumbar radiculopathy Postoperative diagnosis:same Procedure:bilateral laminotomies and foraminotomies L4-5 Surgeon: Kristeen Miss M.D. Assistant:none Anesthesia: Gen. endotracheal Indications:patient is a 67 year old individual who is having significant symptoms of neurogenic claudication. Set a previous microdiscectomy years ago at L4-5. He has a mild grade 1 spondylolisthesis without significant translational movement but now is developed severe bilateral foraminal stenosis with central canal stenosis has symptoms of neurogenic claudication in addition to radiculopathy. He also has back pain.  Procedure: Patient was brought to the operating room supine on a stretcher. After the smooth induction of general endotracheal anesthesia he was turned prone onto the operating table. The back was prepped with alcohol and DuraPrep and draped in a sterile fashion. Localizing radiographs identified the interspace atL4-5. A midline incision was created and carried down to the lumbar dorsal fascia which was opened on either side of midline at this level. The dissection was carried out over the interlaminar space and the facet joints atL4-5. A self-retaining retractor was placed in the wound. A high-speed drill was then used to remove the inferior margin of the lamina out to the medial wall the facet performing the initial portion of the dissection. The yellow ligament was then taken up and removed. Common dural tube was identified and dissection was carefully undertaken removing redundant yellow ligament and overgrown facet from the superior facet ofL4and the laminar arch ofL4. A foraminotomy was created over theL5nerve root.the same procedure was then repeated on the opposite side. Once an adequate decompression was identified and secured, hemostasis and the soft tissues obtained meticulously and when verified retractor  was removed the wound was irrigated copiously with antibiotic irrigating solution, and then the lumbar dorsal fascia was closed with #1 Vicryl in interrupted fashion.20 mLOf half percent Marcaine was injected into the paraspinous fascia. 2-0 Vicryl was used to close the subcutaneous fascia and 3-0 Vicryl was used to close the subcuticular skin. Blood loss was estimated as50 mL. The patient was returned to the recovery room in stable condition

## 2017-07-10 NOTE — Anesthesia Preprocedure Evaluation (Addendum)
Anesthesia Evaluation  Patient identified by MRN, date of birth, ID band  Reviewed: Allergy & Precautions, NPO status , Patient's Chart, lab work & pertinent test results  Airway Mallampati: II  TM Distance: >3 FB Neck ROM: Full    Dental  (+) Edentulous Upper, Edentulous Lower   Pulmonary shortness of breath, sleep apnea , COPD, former smoker,    breath sounds clear to auscultation       Cardiovascular hypertension, + CAD and + Peripheral Vascular Disease   Rhythm:Regular Rate:Normal     Neuro/Psych Seizures -,   Neuromuscular disease    GI/Hepatic hiatal hernia, GERD  ,(+) Hepatitis -  Endo/Other    Renal/GU CRFRenal disease     Musculoskeletal   Abdominal   Peds  Hematology   Anesthesia Other Findings   Reproductive/Obstetrics                            Anesthesia Physical Anesthesia Plan  ASA: III  Anesthesia Plan: General   Post-op Pain Management:    Induction: Intravenous  PONV Risk Score and Plan: 3 and Treatment may vary due to age or medical condition, Dexamethasone and Ondansetron  Airway Management Planned: Oral ETT  Additional Equipment:   Intra-op Plan:   Post-operative Plan: Extubation in OR  Informed Consent: I have reviewed the patients History and Physical, chart, labs and discussed the procedure including the risks, benefits and alternatives for the proposed anesthesia with the patient or authorized representative who has indicated his/her understanding and acceptance.   Dental advisory given  Plan Discussed with: CRNA  Anesthesia Plan Comments:         Anesthesia Quick Evaluation

## 2017-07-10 NOTE — Transfer of Care (Signed)
Immediate Anesthesia Transfer of Care Note  Patient: [name]  Procedure(s) Performed: Bilateral Lumbar Four- Five Laminectomy/Foraminotomy (Bilateral Back)  Patient Location: PACU  Anesthesia Type:General  Level of Consciousness: awake and alert   Airway & Oxygen Therapy: Patient Spontanous Breathing and Patient connected to nasal cannula oxygen  Post-op Assessment: Report given to RN and Post -op Vital signs reviewed and stable  Post vital signs: Reviewed and stable  Last Vitals:  Vitals:   07/10/17 1309  BP: (!) 151/75  Pulse: 64  Resp: 20  Temp: 36.8 C  SpO2: 97%    Last Pain:  Vitals:   07/10/17 1309  TempSrc: Oral  PainSc: 3       Patients Stated Pain Goal: 3 (64/33/29 5188)  Complications: No apparent anesthesia complications

## 2017-07-10 NOTE — Telephone Encounter (Signed)
Copied from [place]. Topic: Quick Communication - See Telephone Encounter >> Jul 10, 2017 11:54 AM Aurelio Brash B wrote: CRM for notification. See Telephone encounter for:   Wife is calling requesting a refill for the pt- oxycodone (ROXICODONE) 30 MG immediate release tablet, pt had to cx the apt yesterday due to having apt for surgical clearance and then having surgery today,  he will be kept over night tonight and d/c home tomorrow and she is worried about him not having the pain med.       07/10/17.

## 2017-07-10 NOTE — Anesthesia Postprocedure Evaluation (Signed)
Anesthesia Post Note  Patient: [name]  Procedure(s) Performed: Bilateral Lumbar Four- Five Laminectomy/Foraminotomy (Bilateral Back)     Patient location during evaluation: PACU Anesthesia Type: General Level of consciousness: awake and alert Pain management: pain level controlled Vital Signs Assessment: post-procedure vital signs reviewed and stable Respiratory status: spontaneous breathing, nonlabored ventilation, respiratory function stable and patient connected to nasal cannula oxygen Cardiovascular status: blood pressure returned to baseline and stable Postop Assessment: no apparent nausea or vomiting Anesthetic complications: no    Last Vitals:  Vitals:   07/10/17 1900 07/10/17 1904  BP:  112/71  Pulse: 65 66  Resp: 11 11  Temp:    SpO2: 97% 95%    Last Pain:  Vitals:   07/10/17 1900  TempSrc:   PainSc: 0-No pain                 Leily Capek,W. EDMOND

## 2017-07-11 ENCOUNTER — Other Ambulatory Visit: Payer: Self-pay

## 2017-07-11 ENCOUNTER — Encounter (HOSPITAL_COMMUNITY): Payer: Self-pay | Admitting: Neurological Surgery

## 2017-07-11 DIAGNOSIS — M48062 Spinal stenosis, lumbar region with neurogenic claudication: Secondary | ICD-10-CM | POA: Diagnosis not present

## 2017-07-11 MED ORDER — METHOCARBAMOL 500 MG PO TABS: 500.0000 mg | ORAL_TABLET | Freq: Four times a day (QID) | ORAL | 3 refills | Status: DC | PRN

## 2017-07-11 MED ORDER — HYDROMORPHONE HCL 1 MG/ML IJ SOLN
1.0000 mg | INTRAMUSCULAR | Status: DC | PRN
  Administered 2017-07-11: 1 mg via INTRAVENOUS
  Filled 2017-07-11: qty 1

## 2017-07-11 MED ORDER — OXYCODONE HCL 30 MG PO TABS: ORAL_TABLET | ORAL | 0 refills | Status: DC

## 2017-07-11 NOTE — Progress Notes (Signed)
Patient is discharged from room 3C11 at this time. Alert and in stable condition. IV site d/c'd and instructions read to patient and spouse with understanding verbalized. Left unit via wheelchair with all belongings at side. 

## 2017-07-11 NOTE — Discharge Summary (Signed)
Physician Discharge Summary  Patient ID: [name] MRN: [id number] DOB/AGE: 01-09-1951 67 y.o.  Admit date: 07/10/2017 Discharge date: 07/11/2017  Admission Diagnoses: Lumbar stenosis with neurogenic claudication L4-L5, lumbar radiculopathy, chronic back pain, complex pain management  Discharge Diagnoses: Lumbar stenosis with neurogenic claudication L4-L5, lumbar radiculopathy, chronic back pain, complex pain management Active Problems:   Lumbar stenosis with neurogenic claudication   Discharged Condition: good  Hospital Course: Patient was admitted to undergo surgical decompression at L4-L5 which he tolerated well.  Consults: None  Significant Diagnostic Studies: None  Treatments: surgery: Bilateral laminotomies and foraminotomies L4-L5.  Discharge Exam: Blood pressure 97/60, pulse 79, temperature 99.6 F (37.6 C), temperature source Oral, resp. rate 16, height 5\' 10"  (1.778 m), weight 73.9 kg (163 lb), SpO2 98 %. Incision is clean and dry motor function is intact in lower extremities. Station and gait are intact.  Disposition: Discharge home  Discharge Instructions    Call MD for:  redness, tenderness, or signs of infection (pain, swelling, redness, odor or green/yellow discharge around incision site)   Complete by:  As directed    Call MD for:  severe uncontrolled pain   Complete by:  As directed    Call MD for:  temperature >100.4   Complete by:  As directed    Diet - low sodium heart healthy   Complete by:  As directed    Discharge instructions   Complete by:  As directed    Okay to shower. Do not apply salves or appointments to incision. No heavy lifting with the upper extremities greater than 15 pounds. May resume driving when not requiring pain medication and patient feels comfortable with doing so.   Incentive spirometry RT   Complete by:  As directed    Increase activity slowly   Complete by:  As directed      Allergies as of 07/11/2017      Reactions    Codeine Nausea And Vomiting   Morphine And Related Other (See Comments)   REACTION IS SIDE EFFECT Decreases respirations      Medication List    TAKE these medications   acyclovir 400 MG tablet Commonly known as:  ZOVIRAX Take 400 mg by mouth daily.   ADDERALL 20 MG tablet Generic drug:  amphetamine-dextroamphetamine Take 20 mg by mouth 2 (two) times daily.   albuterol (2.5 MG/3ML) 0.083% nebulizer solution Commonly known as:  PROVENTIL Take 3 mLs (2.5 mg total) by nebulization every 6 (six) hours as needed for wheezing or shortness of breath.   albuterol 108 (90 Base) MCG/ACT inhaler Commonly known as:  VENTOLIN HFA Inhale 2 puffs into the lungs every 6 (six) hours as needed for wheezing or shortness of breath.   aspirin EC 81 MG tablet Take 1 tablet (81 mg total) by mouth daily.   citalopram 20 MG tablet Commonly known as:  CELEXA Take 1 tablet (20 mg total) by mouth daily.   clonazePAM 0.5 MG tablet Commonly known as:  KLONOPIN TAKE ONE TABLET BY MOUTH TWICE DAILY AS NEEDED What changed:    how much to take  how to take this  when to take this  reasons to take this  additional instructions   clopidogrel 75 MG tablet Commonly known as:  PLAVIX Take 75 mg by mouth daily.   fentaNYL 50 MCG/HR Commonly known as:  DURAGESIC - dosed mcg/hr Place 1 patch (50 mcg total) onto the skin every other day. Do not fill until 06/06/2017   guaifenesin 400  MG Tabs tablet Commonly known as:  HUMIBID E Take 400 mg by mouth 2 (two) times daily.   lansoprazole 30 MG capsule Commonly known as:  PREVACID Take 1 capsule (30 mg total) by mouth daily.   methocarbamol 500 MG tablet Commonly known as:  ROBAXIN Take 1 tablet (500 mg total) by mouth every 6 (six) hours as needed for muscle spasms.   nitroGLYCERIN 0.4 MG SL tablet Commonly known as:  NITROSTAT Place 1 tablet (0.4 mg total) under the tongue every 5 (five) minutes as needed for chest pain.   oxycodone 30 MG  immediate release tablet Commonly known as:  ROXICODONE 1-2 tablets daily po prn pain What changed:    how much to take  how to take this  when to take this  reasons to take this  additional instructions   oxycodone 30 MG immediate release tablet Commonly known as:  ROXICODONE One tablet up to 4 times a day as needed for post surgical pain What changed:  You were already taking a medication with the same name, and this prescription was added. Make sure you understand how and when to take each.   rosuvastatin 20 MG tablet Commonly known as:  CRESTOR Take 1 tablet (20 mg total) by mouth daily.   tiZANidine 2 MG tablet Commonly known as:  ZANAFLEX Take 2 mg by mouth every 6 (six) hours as needed for muscle spasms.   Vitamin B-12 5000 MCG Subl Place 5,000 mcg under the tongue daily.   Vitamin D 1000 units capsule Take 1,000 Units by mouth daily.        SignedEarleen Newport 07/11/2017, 8:35 AM

## 2017-07-11 NOTE — Evaluation (Signed)
Occupational Therapy Evaluation Patient Details Name: [name] MRN: [id number] DOB: 03/18/1951 Today's Date: 07/11/2017    History of Present Illness 67 yo admitted for L4-5 lami. PMHx: C6 stabilization, lung CA s/p resection, RCR, CAD   Clinical Impression   PTA, pt was living with his wife and was independent. Currently, pt requires supervision-Min Guard A for ADLs and functional mobility. Provided education on back precautions, bed mobility, LB ADLs, and toileting; pt demonstrated and verbalized understanding. Answered all pt questions. Recommend dc home once medically stable per physician. All acute OT needs met and will sign off. Thank you.     Follow Up Recommendations  No OT follow up;Supervision/Assistance - 24 hour    Equipment Recommendations  None recommended by OT    Recommendations for Other Services PT consult     Precautions / Restrictions Precautions Precautions: Back Precaution Booklet Issued: Yes (comment) Precaution Comments: Pt able to recall 2/3 back precautions. Wife able to provide cues for all back precautions Restrictions Weight Bearing Restrictions: No      Mobility Bed Mobility Overal bed mobility: Needs Assistance Bed Mobility: Rolling;Sidelying to Sit;Sit to Sidelying Rolling: Supervision Sidelying to sit: Supervision     Sit to sidelying: Supervision General bed mobility comments: Cues for sequencing. Use bed rail  Transfers Overall transfer level: Modified independent               General transfer comment: increased time to rise    Balance Overall balance assessment: No apparent balance deficits (not formally assessed)                                         ADL either performed or assessed with clinical judgement   ADL Overall ADL's : Needs assistance/impaired Eating/Feeding: Set up;Sitting   Grooming: Set up;Standing Grooming Details (indicate cue type and reason): Educated pt on compensatory  techniques Upper Body Bathing: Set up;Supervision/ safety;Sitting   Lower Body Bathing: Min guard;Sit to/from stand   Upper Body Dressing : Set up;Supervision/safety;Sitting Upper Body Dressing Details (indicate cue type and reason): Pt donning shirt with set up and supervision Lower Body Dressing: Min guard;Sit to/from stand;With caregiver independent assisting Lower Body Dressing Details (indicate cue type and reason): Pt able to bring ankles to knees. Donning socks, underwear, and pants with Min Guard for safety. Min A from wife for adjusting shoes Toilet Transfer: Min guard;Ambulation;Regular Toilet     Toileting - Clothing Manipulation Details (indicate cue type and reason): Discussed safe toilet hygiene after BM for adhereance to back precautions     Functional mobility during ADLs: Min guard General ADL Comments: Pt performing ADLs and fucnitonal mobiltiy at [employer] level. Providing education on back precautions, bed mobility, UB ADLs, LB ADLs, and toileting.     Vision Patient Visual Report: No change from baseline       Perception     Praxis      Pertinent Vitals/Pain Pain Assessment: Faces Pain Score: 5  Faces Pain Scale: Hurts little more Pain Location: incision Pain Descriptors / Indicators: Sore Pain Intervention(s): Monitored during session;Limited activity within patient's tolerance;Repositioned     Hand Dominance Right   Extremity/Trunk Assessment Upper Extremity Assessment Upper Extremity Assessment: Overall WFL for tasks assessed   Lower Extremity Assessment Lower Extremity Assessment: Overall WFL for tasks assessed   Cervical / Trunk Assessment Cervical / Trunk Assessment: Other exceptions Cervical / Trunk Exceptions:  s/p L4-5 lami   Communication Communication Communication: No difficulties   Cognition Arousal/Alertness: Awake/alert Behavior During Therapy: WFL for tasks assessed/performed Overall Cognitive Status: Within  Functional Limits for tasks assessed                                     General Comments  Wife present throughout session    Exercises     Shoulder [place] expects to be discharged to:: Private residence Living Arrangements: Spouse/significant other Available Help at Discharge: Family Type of Home: House Home Access: Level entry     [place]: One level     Bathroom Shower/Tub: Occupational psychologist: Handicapped height     [place]: Shower seat(Daughter has gotten shower seat)          Prior Functioning/Environment Level of Independence: Independent                 OT Problem List: Decreased strength;Decreased range of motion;Decreased activity tolerance;Impaired balance (sitting and/or standing);Decreased safety awareness;Decreased knowledge of use of DME or AE;Decreased knowledge of precautions;Pain      OT Treatment/Interventions:      OT Goals(Current goals can be found in the care plan section) Acute Rehab OT Goals Patient Stated Goal: Go home today OT Goal Formulation: All assessment and education complete, DC therapy  OT Frequency:     Barriers to D/C:            Co-evaluation              AM-PAC PT "6 Clicks" Daily Activity     Outcome Measure Help from another person eating meals?: None Help from another person taking care of personal grooming?: None Help from another person toileting, which includes using toliet, bedpan, or urinal?: A Little Help from another person bathing (including washing, rinsing, drying)?: A Little Help from another person to put on and taking off regular upper body clothing?: A Little Help from another person to put on and taking off regular lower body clothing?: A Little 6 Click Score: 20   End of Session Nurse Communication: Mobility status;Precautions  Activity Tolerance: Patient tolerated treatment well Patient left: in bed;with  call bell/phone within reach;with family/visitor present  OT Visit Diagnosis: Unsteadiness on feet (R26.81);Other abnormalities of gait and mobility (R26.89);Muscle weakness (generalized) (M62.81);Pain Pain - part of body: (Back)                Time: 5462-7035 OT Time Calculation (min): 11 min Charges:  OT General Charges $OT Visit: 1 Visit OT Evaluation $OT Eval Low Complexity: 1 Low G-Codes:     Rahkeem Senft MSOT, OTR/L Acute Rehab Pager: [phone] Office: [place] 07/11/2017, 11:59 AM

## 2017-07-11 NOTE — Evaluation (Signed)
Physical Therapy Evaluation/ Discharge Patient Details Name: [name] MRN: [id number] DOB: 09-08-1950 Today's Date: 07/11/2017   History of Present Illness  67 yo admitted for L4-5 lami. PMHx: C6 stabilization, lung CA s/p resection, RCR, CAD  Clinical Impression  Pt very pleasant, moving well and educated for all precautions, transfers and gait. PT with assist of wife at home who also verbalized understanding of education. Pt reports no pain or weakness in bil LE, no falls and no need for assist at home. Pt education complete, no further acute needs with pt encouraged to ambulate 3x/day and limit sitting.      Follow Up Recommendations No PT follow up    Equipment Recommendations  None recommended by PT    Recommendations for Other Services       Precautions / Restrictions Precautions Precautions: Back Precaution Booklet Issued: Yes (comment)      Mobility  Bed Mobility Overal bed mobility: Needs Assistance Bed Mobility: Rolling;Sidelying to Sit;Sit to Sidelying Rolling: Supervision Sidelying to sit: Supervision     Sit to sidelying: Supervision General bed mobility comments: cues for sequence and to prevent twisting. pt relying on rail and educated for lack of use at home and not to pull on wife  Transfers Overall transfer level: Modified independent               General transfer comment: increased time to rise  Ambulation/Gait Ambulation/Gait assistance: Modified independent (Device/Increase time) Ambulation Distance (Feet): 400 Feet Assistive device: None Gait Pattern/deviations: Step-through pattern;Decreased stride length   Gait velocity interpretation: Below normal speed for age/gender    Stairs            Wheelchair Mobility    Modified Rankin (Stroke Patients Only)       Balance Overall balance assessment: No apparent balance deficits (not formally assessed)                                            Pertinent Vitals/Pain Pain Assessment: 0-10 Pain Score: 5  Pain Location: incision Pain Descriptors / Indicators: Sore Pain Intervention(s): Limited activity within patient's tolerance;Repositioned;Monitored during session    Home Living Family/patient expects to be discharged to:: Private residence Living Arrangements: Spouse/significant other Available Help at Discharge: Family Type of Home: House Home Access: Level entry     Home Layout: One level Home Equipment: None      Prior Function Level of Independence: Independent               Hand Dominance        Extremity/Trunk Assessment   Upper Extremity Assessment Upper Extremity Assessment: Overall WFL for tasks assessed    Lower Extremity Assessment Lower Extremity Assessment: Overall WFL for tasks assessed    Cervical / Trunk Assessment Cervical / Trunk Assessment: Normal  Communication   Communication: No difficulties  Cognition Arousal/Alertness: Awake/alert Behavior During Therapy: WFL for tasks assessed/performed Overall Cognitive Status: Within Functional Limits for tasks assessed                                        General Comments      Exercises     Assessment/Plan    PT Assessment Patent does not need any further PT services  PT Problem List  PT Treatment Interventions      PT Goals (Current goals can be found in the Care Plan section)  Acute Rehab PT Goals PT Goal Formulation: All assessment and education complete, DC therapy    Frequency     Barriers to discharge        Co-evaluation               AM-PAC PT "6 Clicks" Daily Activity  Outcome Measure Difficulty turning over in bed (including adjusting bedclothes, sheets and blankets)?: A Little Difficulty moving from lying on back to sitting on the side of the bed? : A Little Difficulty sitting down on and standing up from a chair with arms (e.g., wheelchair, bedside commode,  etc,.)?: A Little Help needed moving to and from a bed to chair (including a wheelchair)?: None Help needed walking in hospital room?: None Help needed climbing 3-5 steps with a railing? : None 6 Click Score: 21    End of Session   Activity Tolerance: Patient tolerated treatment well Patient left: in bed;with call bell/phone within reach;with family/visitor present(EOB as pt denied chair) Nurse Communication: Precautions PT Visit Diagnosis: Other abnormalities of gait and mobility (R26.89)    Time: 6063-0160 PT Time Calculation (min) (ACUTE ONLY): 11 min   Charges:   PT Evaluation $PT Eval Low Complexity: 1 Low     PT G Codes:        Elwyn Reach, PT [phone]   [place] 07/11/2017, 8:19 AM

## 2017-07-13 ENCOUNTER — Other Ambulatory Visit: Payer: Self-pay | Admitting: *Deleted

## 2017-07-13 ENCOUNTER — Telehealth: Payer: Self-pay | Admitting: Family Medicine

## 2017-07-13 DIAGNOSIS — M961 Postlaminectomy syndrome, not elsewhere classified: Secondary | ICD-10-CM

## 2017-07-13 MED ORDER — CLONAZEPAM 0.5 MG PO TABS: ORAL_TABLET | ORAL | 3 refills | Status: DC

## 2017-07-13 MED ORDER — FENTANYL 50 MCG/HR TD PT72: 50.0000 ug | MEDICATED_PATCH | TRANSDERMAL | 0 refills | Status: DC

## 2017-07-13 MED FILL — Thrombin For Soln 5000 Unit: CUTANEOUS | Qty: 5000 | Status: AC

## 2017-07-13 MED FILL — Thrombin For Soln 20000 Unit: CUTANEOUS | Qty: 1 | Status: CN

## 2017-07-13 MED FILL — Thrombin For Soln 5000 Unit: CUTANEOUS | Qty: 2 | Status: AC

## 2017-07-13 NOTE — Telephone Encounter (Signed)
Copied from [place]. Topic: Quick Communication - Rx Refill/Question >> Jul 13, 2017 11:25 AM Lysle Morales L, NT wrote: Medication: fentaNYL (DURAGESIC - DOSED MCG/HR) 50 MCG/HR and also oxycodone (ROXICODONE) 30 MG immediate release tablet Has the patient contacted their pharmacy? no: (Agent: If no, request that the patient contact the pharmacy for the refill.) Preferred Pharmacy (with phone number or street name):[id number] [place], [place] [phone] (Phone) [phone] (Fax)   Agent: Please be advised that RX refills may take up to 3 business days. We ask that you follow-up with your pharmacy. Please call when ready at [phone]

## 2017-07-13 NOTE — Telephone Encounter (Signed)
Last office visit 05/06/2017.  Last refilled 05/06/2017 for #60 with 3 refills ([place]).  Ok to refill?

## 2017-07-13 NOTE — Telephone Encounter (Signed)
07/11/2017 - Dr. Ellene Route wrote for #60 roxicodone 30 mg.   Deny - I cannot fill on top of this yet.

## 2017-07-13 NOTE — Telephone Encounter (Signed)
Fentanyl patch  refill Last OV: 05/06/17 Last Refill:05/27/17 [facility] [phone]

## 2017-07-16 ENCOUNTER — Telehealth: Payer: Self-pay | Admitting: Internal Medicine

## 2017-07-16 NOTE — Telephone Encounter (Signed)
-----   Message from Vanessa Ralphs, RN sent at 07/16/2017 11:16 AM EST ----- Regarding: 3 month appt needed I think this patient checked out late that day. He just needs a 3 month f/u with Dr End.  Thanks so much! Danise Mina

## 2017-07-16 NOTE — Telephone Encounter (Signed)
Attempted to schedule 3 m fu with Dr. Haskell Revel  lmov

## 2017-07-17 DIAGNOSIS — R69 Illness, unspecified: Secondary | ICD-10-CM | POA: Diagnosis not present

## 2017-07-17 MED ORDER — CLOPIDOGREL BISULFATE 75 MG PO TABS: 75.0000 mg | ORAL_TABLET | Freq: Every day | ORAL | 1 refills | Status: DC

## 2017-07-17 MED ORDER — ALBUTEROL SULFATE (2.5 MG/3ML) 0.083% IN NEBU: 2.5000 mg | INHALATION_SOLUTION | Freq: Four times a day (QID) | RESPIRATORY_TRACT | 5 refills | Status: DC | PRN

## 2017-07-17 NOTE — Addendum Note (Signed)
Addended by: Carter Kitten on: 07/17/2017 11:21 AM   Modules accepted: Orders

## 2017-07-17 NOTE — Telephone Encounter (Signed)
Fentynyl patch was sent in on 07/13/2017.  [facility] [name] [facility].  Refills for Albuterol Nebulizer Solution and [facility] [place]  [name] notified by telephone.

## 2017-07-17 NOTE — Telephone Encounter (Addendum)
Patient checking status? Please advise. Also needing refill on clopidogrel (PLAVIX) 75 MG tablet and albuterol (PROVENTIL) (2.5 MG/3ML) 0.083% nebulizer solution

## 2017-07-20 NOTE — Telephone Encounter (Signed)
Lmov for patient to schedule 29m fu with Dr Barocio Revel

## 2017-07-22 NOTE — Telephone Encounter (Signed)
Lmov for patient to schedule 63m fu with Dr Walthour Revel

## 2017-07-23 ENCOUNTER — Encounter: Payer: Self-pay | Admitting: Internal Medicine

## 2017-07-23 NOTE — Telephone Encounter (Signed)
Unable to reach mailed letter

## 2017-07-28 ENCOUNTER — Other Ambulatory Visit: Payer: Self-pay | Admitting: *Deleted

## 2017-07-28 MED ORDER — RANITIDINE HCL 150 MG PO TABS: 150.0000 mg | ORAL_TABLET | Freq: Two times a day (BID) | ORAL | 3 refills | Status: DC

## 2017-07-28 MED ORDER — CITALOPRAM HYDROBROMIDE 20 MG PO TABS: 20.0000 mg | ORAL_TABLET | Freq: Every day | ORAL | 1 refills | Status: DC

## 2017-07-28 NOTE — Telephone Encounter (Signed)
[facility]ram and Ranitidine.  Ranitidine is not on current medication list.  I did find ranitidine hcl 150 mg on past history list.  Ok to refill?

## 2017-08-05 ENCOUNTER — Encounter: Payer: Self-pay | Admitting: Family Medicine

## 2017-08-05 ENCOUNTER — Ambulatory Visit (INDEPENDENT_AMBULATORY_CARE_PROVIDER_SITE_OTHER): Payer: Medicare HMO | Admitting: Family Medicine

## 2017-08-05 ENCOUNTER — Other Ambulatory Visit: Payer: Self-pay

## 2017-08-05 VITALS — BP 110/74 | HR 87 | Temp 97.5°F | Ht 70.0 in | Wt 162.0 lb

## 2017-08-05 DIAGNOSIS — M961 Postlaminectomy syndrome, not elsewhere classified: Secondary | ICD-10-CM | POA: Diagnosis not present

## 2017-08-05 DIAGNOSIS — F909 Attention-deficit hyperactivity disorder, unspecified type: Secondary | ICD-10-CM | POA: Diagnosis not present

## 2017-08-05 DIAGNOSIS — R69 Illness, unspecified: Secondary | ICD-10-CM | POA: Diagnosis not present

## 2017-08-05 DIAGNOSIS — R52 Pain, unspecified: Secondary | ICD-10-CM

## 2017-08-05 MED ORDER — FENTANYL 50 MCG/HR TD PT72
50.0000 ug | MEDICATED_PATCH | TRANSDERMAL | 0 refills | Status: DC
Start: 2017-08-05 — End: 2017-11-05

## 2017-08-05 MED ORDER — FENTANYL 50 MCG/HR TD PT72: 50.0000 ug | MEDICATED_PATCH | TRANSDERMAL | 0 refills | Status: DC
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
## 2017-08-06 DIAGNOSIS — M48062 Spinal stenosis, lumbar region with neurogenic claudication: Secondary | ICD-10-CM | POA: Diagnosis not present

## 2017-08-10 LAB — PAIN MGMT, PROFILE 8 W/CONF, U
6 Acetylmorphine: NEGATIVE ng/mL (ref <10)
ALCOHOL METABOLITES: NEGATIVE ng/mL (ref <500)
ALPHAHYDROXYMIDAZOLAM: NEGATIVE ng/mL (ref <50)
AMINOCLONAZEPAM: 189 ng/mL — AB (ref <25)
Alphahydroxyalprazolam: 106 ng/mL — ABNORMAL HIGH (ref <25)
Alphahydroxytriazolam: NEGATIVE ng/mL (ref <50)
Amphetamine: 3064 ng/mL — ABNORMAL HIGH (ref <250)
Amphetamines: POSITIVE ng/mL — AB (ref <500)
BUPRENORPHINE, URINE: NEGATIVE ng/mL (ref <5)
Benzodiazepines: POSITIVE ng/mL — AB (ref <100)
CODEINE: NEGATIVE ng/mL (ref <50)
Cocaine Metabolite: NEGATIVE ng/mL (ref <150)
Creatinine: 167.2 mg/dL
HYDROCODONE: NEGATIVE ng/mL (ref <50)
HYDROXYETHYLFLURAZEPAM: NEGATIVE ng/mL (ref <50)
Hydromorphone: NEGATIVE ng/mL (ref <50)
Lorazepam: NEGATIVE ng/mL (ref <50)
MARIJUANA METABOLITE: NEGATIVE ng/mL (ref <20)
MDMA: NEGATIVE ng/mL (ref <500)
METHAMPHETAMINE: NEGATIVE ng/mL (ref <250)
Morphine: NEGATIVE ng/mL (ref <50)
NORDIAZEPAM: NEGATIVE ng/mL (ref <50)
NOROXYCODONE: 12928 ng/mL — AB (ref <50)
Norhydrocodone: NEGATIVE ng/mL (ref <50)
OPIATES: NEGATIVE ng/mL (ref <100)
OXYCODONE: POSITIVE ng/mL — AB (ref <100)
Oxazepam: NEGATIVE ng/mL (ref <50)
Oxidant: NEGATIVE ug/mL (ref <200)
Oxycodone: 4008 ng/mL — ABNORMAL HIGH (ref <50)
Oxymorphone: 291 ng/mL — ABNORMAL HIGH (ref <50)
TEMAZEPAM: NEGATIVE ng/mL (ref <50)
pH: 6.49 (ref 4.5–9.0)

## 2017-08-21 ENCOUNTER — Other Ambulatory Visit: Payer: Self-pay | Admitting: Family Medicine

## 2017-08-21 ENCOUNTER — Telehealth: Payer: Self-pay | Admitting: Family Medicine

## 2017-08-21 DIAGNOSIS — M48062 Spinal stenosis, lumbar region with neurogenic claudication: Secondary | ICD-10-CM

## 2017-08-21 DIAGNOSIS — M961 Postlaminectomy syndrome, not elsewhere classified: Secondary | ICD-10-CM

## 2017-08-21 MED ORDER — OXYCODONE HCL 30 MG PO TABS: ORAL_TABLET | ORAL | 0 refills | Status: DC

## 2017-08-21 NOTE — Telephone Encounter (Signed)
Refilled. This was labelled incorrectly. There is no medication reaction.

## 2017-08-21 NOTE — Telephone Encounter (Signed)
Copied from [place] [phone]. Topic: Quick Communication - See Telephone Encounter >> Aug 21, 2017 11:15 AM Cleaster Corin, NT wrote: CRM for notification. See Telephone encounter for: 08/21/17. Pt. Wife calling to see if Dr. Lorelei Pont will fill med.  oxyCODONE (ROXICODONE) 30 MG immediate release tablet [[id number]]   [id number] [place] [address] [place] [address] Phone: [phone] Fax: [phone]

## 2017-08-21 NOTE — Telephone Encounter (Signed)
Per [facility] records and my discussion with him directly now until he ultimately gets in to see pain management. Referral made months ago.

## 2017-08-21 NOTE — Telephone Encounter (Signed)
Last office visit 08/05/2017.  Last refilled 07/11/2017 for #60 by Dr. Ellene Route.  Ok to refill?

## 2017-09-09 ENCOUNTER — Telehealth: Payer: Self-pay | Admitting: Family Medicine

## 2017-09-09 NOTE — Telephone Encounter (Signed)
Copied from [place] [id number]. Topic: Quick Communication - See Telephone Encounter >> Sep 09, 2017  8:34 AM Ivar Drape wrote: CRM for notification. See Telephone encounter for: 09/09/17. Patient needs something called in to help him go to the bathroom because he is on Oxycodone and fentaNYL.

## 2017-09-09 NOTE — Telephone Encounter (Signed)
I would start with generic miralax, dose bid one day, then increase to 3x/day, 4x/day, etc. Until BM

## 2017-09-09 NOTE — Telephone Encounter (Signed)
[name] notified as instructed by telephone.

## 2017-09-09 NOTE — Telephone Encounter (Signed)
I spoke with Mrs Sackmann (DPR signed) pt last BM was 09/03/17. Pt said OTC meds and fleets enema are not effective; has tried Linzess and that did not help constipation either. Pt has hx of constipation and pt has abd pain when gets backed up and abd swelling.Pt is slightly swollen in abd now; pt is passing gas.Pt drinks a lot of soda instead of water. [facility] [place] [facility]  08/05/17.

## 2017-09-09 NOTE — Telephone Encounter (Signed)
[name] notified as instructed by telephone.  She states Miralax does not work for [name].  Please advise.

## 2017-09-09 NOTE — Telephone Encounter (Signed)
I would get OTC magnesium citrate and take a bottle. If that doesn't work, then repeat in maybe 3 hours.   I would not do more than 2 in a day.   He may want to buy a number of them from the pharmacy. Call and check before he goes.

## 2017-09-10 ENCOUNTER — Telehealth: Payer: Self-pay | Admitting: Family Medicine

## 2017-09-10 ENCOUNTER — Other Ambulatory Visit: Payer: Self-pay | Admitting: *Deleted

## 2017-09-10 DIAGNOSIS — M961 Postlaminectomy syndrome, not elsewhere classified: Secondary | ICD-10-CM

## 2017-09-10 DIAGNOSIS — M48062 Spinal stenosis, lumbar region with neurogenic claudication: Secondary | ICD-10-CM

## 2017-09-10 NOTE — Telephone Encounter (Signed)
Last office visit 08/05/2017.  Last refilled 08/21/2017 for #60 with no refills.  Refill?

## 2017-09-10 NOTE — Telephone Encounter (Signed)
Too early. I will give 30 day supply of opioids when they are due.  3/17 #60 by Dr. Ellene Route 3/29 # 52 by Dr. Lorelei Pont These are both for Roxicodone 30 mg

## 2017-09-10 NOTE — Telephone Encounter (Signed)
Copied from [place]. Topic: Quick Communication - See Telephone Encounter >> Sep 10, 2017  5:27 PM Rutherford Nail, [place] wrote: CRM for notification. See Telephone encounter for: 09/10/17. Patient's wife calling and states she having trouble getting the patient's Oxycodone filled. States they sent a request yesterday morning(09/09/17). States that Dr Lorelei Pont prescribed 60 tablets to take up to 4x a day on 3/29 and that was a 2 week supply. Dr Ellene Route prescribed the same about a few weeks before then to last 2 weeks. Please advise. Currently out of the medication. CB#: [phone]

## 2017-09-14 ENCOUNTER — Telehealth: Payer: Self-pay | Admitting: *Deleted

## 2017-09-14 NOTE — Telephone Encounter (Signed)
I spoke with them, and I reviewed previously Dr. Clarice Pole notes which stated such.   Upcoming pain appt.

## 2017-09-14 NOTE — Telephone Encounter (Signed)
Have him follow-up with me this week for face to face assessment and discussion about [place] STOP Act and finding him a pain management home.

## 2017-09-14 NOTE — Telephone Encounter (Signed)
Appointment scheduled for 09/16/2017 at 11:00 am with Dr. Lorelei Pont.

## 2017-09-14 NOTE — Telephone Encounter (Signed)
Last Rx 08/21/2017. Last OV 08/05/2017

## 2017-09-14 NOTE — Telephone Encounter (Signed)
Refill was denied on 09/10/2017.  Too early for refill.  [name] will need an office visit with Dr. Lorelei Pont this week prior to refills.  [facility]

## 2017-09-14 NOTE — Telephone Encounter (Signed)
Copied from [place]. Topic: Inquiry >> Sep 14, 2017  2:43 PM Conception Chancy, NT wrote: Reason for CRM: Simone Curia is calling from [facility] and states that she would like to discuss with Dr. Lorelei Pont nurse about Dr. Lorelei Pont managing patients pain medicine. Please advise.   [phone]

## 2017-09-14 NOTE — Telephone Encounter (Signed)
Wife following up on this request.  Wife would like a call back to advise the status.  She states pt is in a lot of pain.  Wife also advised she thought this was a 2 week supply due to the directions.  [id number] [address], [place] [phone] (Phone) [phone] (Fax)

## 2017-09-14 NOTE — Telephone Encounter (Signed)
PLEASE NOTE: All timestamps contained within this report are represented as [place] Standard Time. CONFIDENTIALTY NOTICE: This fax transmission is intended only for the addressee. It contains information that is legally privileged, confidential or otherwise protected from use or disclosure. If you are not the intended recipient, you are strictly prohibited from reviewing, disclosing, copying using or disseminating any of this information or taking any action in reliance on or regarding this information. If you have received this fax in error, please notify us immediately by telephone so that we can arrange for its return to [place]. Phone: [phone], Toll-Free: [phone], Fax: [phone] Page: 1 of 1 Call Id: [id number] [place] Patient Name: [name] Gender: Male DOB: November 09, 1950 Age: 67 Y 72 D Return Phone Number: Address: City/State/Zip: [place] Client [place] Night - Client Client Site [place] Physician Copland, Frederico Hamman - MD Contact Type Call Who Is Calling Pharmacy Call Type Pharmacy Send to RN Chief Complaint Paging or Request for Consult Reason for Call Request to speak to Physician Initial Comment Caller states is [facility] @ [phone], electronic request was sent a couple of days ago for Oxycodone 30mg , needing assistance in getting it filled. Additional Comment Pharmacy Name Pharmacist Name Pharmacy Number Translation No Nurse Assessment Nurse: Tawanna Solo, RN, Vaughan Basta Date/Time Eilene Ghazi Time): 09/12/2017 11:38:46 AM Confirm and document reason for call. If symptomatic, describe symptoms. ---Pharmacist Richmond Campbell says she sent a request last week for oxycodone prescription, and did not hear back. Caller says patient has called back for refill and wants to know if Dr. Edilia Bo can fill it. Does the  patient have any new or worsening symptoms? ---No Guidelines Guideline Title Affirmed Question Affirmed Notes Nurse Date/Time (Eastern Time) Disp. Time Eilene Ghazi Time) Disposition Final User 09/12/2017 11:43:09 AM Clinical Call Yes Tawanna Solo, RN, Vaughan Basta Comments User: Prescilla Sours, RN Date/Time Eilene Ghazi Time): 09/12/2017 11:41:59 AM Nurse informed caller that narcotics cannot be filled after hours.

## 2017-09-15 ENCOUNTER — Telehealth: Payer: Self-pay

## 2017-09-15 NOTE — Telephone Encounter (Signed)
I spoke with [name] directly yesterday myself as is documented in the chart.

## 2017-09-15 NOTE — Telephone Encounter (Signed)
Copied from [place]. Topic: Inquiry >> Sep 14, 2017  2:43 PM Conception Chancy, NT wrote: Reason for CRM: Simone Curia is calling from [facility] and states that she would like to discuss with Dr. Lorelei Pont nurse about Dr. Lorelei Pont managing patients pain medicine. Please advise.   [phone]  >> Sep 14, 2017  5:10 PM Neva Seat wrote: Please call pt back if needed. She thinks she has missed a call from office.

## 2017-09-16 ENCOUNTER — Telehealth: Payer: Self-pay | Admitting: Family Medicine

## 2017-09-16 ENCOUNTER — Ambulatory Visit (INDEPENDENT_AMBULATORY_CARE_PROVIDER_SITE_OTHER): Payer: Medicare HMO | Admitting: Family Medicine

## 2017-09-16 ENCOUNTER — Other Ambulatory Visit: Payer: Self-pay

## 2017-09-16 ENCOUNTER — Encounter: Payer: Self-pay | Admitting: Family Medicine

## 2017-09-16 DIAGNOSIS — M961 Postlaminectomy syndrome, not elsewhere classified: Secondary | ICD-10-CM | POA: Diagnosis not present

## 2017-09-16 DIAGNOSIS — M48062 Spinal stenosis, lumbar region with neurogenic claudication: Secondary | ICD-10-CM

## 2017-09-16 MED ORDER — ACYCLOVIR 400 MG PO TABS: 400.0000 mg | ORAL_TABLET | Freq: Every day | ORAL | 5 refills | Status: DC

## 2017-09-16 MED ORDER — OXYCODONE HCL 30 MG PO TABS: ORAL_TABLET | ORAL | 0 refills | Status: DC

## 2017-09-16 MED ORDER — OXYCODONE HCL 30 MG PO TABS: 30.0000 mg | ORAL_TABLET | Freq: Three times a day (TID) | ORAL | 0 refills | Status: DC

## 2017-09-16 MED ORDER — LACTULOSE 10 GM/15ML PO SOLN
10.0000 g | Freq: Three times a day (TID) | ORAL | 0 refills | Status: DC
Start: 2017-09-16 — End: 2017-12-22
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
## 2017-09-16 NOTE — Telephone Encounter (Signed)
Copied from [place] [id number]. Topic: Quick Communication - See Telephone Encounter >> Sep 16, 2017 12:54 PM Robina Ade, Helene Kelp D wrote: CRM for notification. See Telephone encounter for: 09/16/17. Patient wife [name] called and would like to talk to provider or his CMA about the cost of his medication being high. Their insurance said that there is a price deduction for his medication and see if Dr. Lorelei Pont can help with this. Also the number where question can be ask for this program is [phone].

## 2017-09-16 NOTE — Telephone Encounter (Signed)
Spoke with [name] to find out which medication there is a problem with.  It is the Fentanyl Patches.  I called the phone number given and PA was completed via telephone.  PA approved from 05/24/2017 through 05/25/2018.  [name] notified by telephone.

## 2017-10-08 ENCOUNTER — Telehealth: Payer: Self-pay | Admitting: Family Medicine

## 2017-10-08 NOTE — Telephone Encounter (Signed)
Copied from [place] [id number]. Topic: Quick Communication - Rx Refill/Question >> Oct 08, 2017 10:33 AM Synthia Innocent wrote: Medication: fentaNYL (DURAGESIC - DOSED MCG/HR) 50 MCG/HR Has the patient contacted their pharmacy? Yes.   (Agent: If no, request that the patient contact the pharmacy for the refill.) [facility]erred [facility] (with phone number [facility] [place] Please be advised that RX refills may take up to 3 business days. We ask that you follow-up with your pharmacy.

## 2017-10-08 NOTE — Telephone Encounter (Signed)
[name] [facility] [place]..  [facility] [name] [facility] and she states the earliest she can refill it would be 10/11/17.  [name] is asking for a Rx to be sent to [facility] in [place] which would break his narcotic contract.  Please advise.

## 2017-10-08 NOTE — Telephone Encounter (Signed)
Attempted to call patient and clarify if he is switching pharmacies. He has one more Fentanyl patch (50MCG/HR)  [phone] [place].  Left message for him to give the office a call back.

## 2017-10-10 NOTE — Telephone Encounter (Signed)
Too early. We can assess this situation on Monday, when he could potentially refill his medication.

## 2017-10-12 NOTE — Telephone Encounter (Signed)
I believe he had a family member pass in [place].   Can you confirm he still needs the medication transfer? If yes, we would first need to cancel script at [place].   Then I will have to send in a new script to the pharmacy in [place].

## 2017-10-12 NOTE — Telephone Encounter (Signed)
I spoke with [name].  She states they will just fill the prescription on file at [place].

## 2017-10-14 NOTE — Telephone Encounter (Signed)
Per pts chart has been refilled.

## 2017-10-16 ENCOUNTER — Telehealth: Payer: Self-pay | Admitting: Family Medicine

## 2017-10-16 NOTE — Telephone Encounter (Signed)
Copied from [place] [phone]. Topic: Quick Communication - Rx Refill/Question >> Oct 16, 2017  9:54 AM Selinda Flavin B, NT wrote: Medication: clopidogrel (PLAVIX) 75 MG tablet  Has the patient contacted their pharmacy? Yes.   (Agent: If no, request that the patient contact the pharmacy for the refill.) (Agent: If yes, when and what did the pharmacy advise?)  Preferred Pharmacy (with phone number or street name): [id number] [place], [place]: Please be advised that RX refills may take up to 3 business days. We ask that you follow-up with your pharmacy.

## 2017-10-16 NOTE — Telephone Encounter (Signed)
Call to pharmacy- patient does have a refill they are getting the Rx ready for him

## 2017-10-28 ENCOUNTER — Telehealth: Payer: Self-pay | Admitting: Internal Medicine

## 2017-10-28 NOTE — Telephone Encounter (Signed)
Tried to call regarding 8/2 and 8/5

## 2017-11-04 ENCOUNTER — Telehealth: Payer: Self-pay | Admitting: Family Medicine

## 2017-11-04 DIAGNOSIS — M961 Postlaminectomy syndrome, not elsewhere classified: Secondary | ICD-10-CM

## 2017-11-04 NOTE — Telephone Encounter (Signed)
Pt. Wasn't able to be seen at pain management due to mix up with appt. Scheduling. The doctor there was seeing if Dr. Lorelei Pont would be able to refill due to this matter. Pt. Wife would like for someone to give her a call either way to let her know.  LOV: 09/16/17

## 2017-11-04 NOTE — Telephone Encounter (Signed)
Copied from [place] [id number]. Topic: Quick Communication - Rx Refill/Question >> Nov 04, 2017  3:11 PM Cleaster Corin, [place] wrote: Medication: fentaNYL (DURAGESIC - DOSED MCG/HR) 50 MCG/HR [[id number]] oxycodone (ROXICODONE) 30 MG immediate release tablet [[id number]]   Has the patient contacted their pharmacy? yes (Agent: If no, request that the patient contact the pharmacy for the refill.) (Agent: If yes, when and what did the pharmacy advise?)  Preferred Pharmacy (with phone number or street name[id number] [place] [address] [place] [address] Phone: [phone] Fax: [phone]  Pt. Wasn't able to be seen at pain management due to mix up with appt. Scheduling. The doctor there was seeing if Dr. Lorelei Pont would be able to refill due to this matter. Pt. Wife would like for someone to give her a call either way to let her know.   Agent: Please be advised that RX refills may take up to 3 business days. We ask that you follow-up with your pharmacy.

## 2017-11-04 NOTE — Telephone Encounter (Signed)
Last office visit 09/16/2017.  Last refilled 08/05/2017 x 3 months.

## 2017-11-05 MED ORDER — FENTANYL 50 MCG/HR TD PT72: 50.0000 ug | MEDICATED_PATCH | TRANSDERMAL | 0 refills | Status: DC

## 2017-11-05 MED ORDER — OXYCODONE HCL 30 MG PO TABS: 30.0000 mg | ORAL_TABLET | Freq: Three times a day (TID) | ORAL | 0 refills | Status: DC | PRN

## 2017-11-05 NOTE — Telephone Encounter (Signed)
Left message for [name] that [name] [facility] an [facility] and can be picked up on 11/12/2017.

## 2017-11-05 NOTE — Telephone Encounter (Signed)
Controlled substance database reviewed.   Based on fill patterns, I sent in a 1 month supply of both Fentanyl and Oxycodone, but they will not be available until 11/12/2017. This is based on his fill patterns and the earliest they can be refilled.   He needs to keep his upcoming 12/09/2017 appointment. This is enough medication to get him to that appointment.

## 2017-11-05 NOTE — Telephone Encounter (Signed)
Spoke with Dr. Hardin Negus office.  They state [name] missed his appointment scheduled for June.  His appointment was at 10:15 am and he showed up in the afternoon so they were unable to see him.  He is rescheduled for 12/09/2017.

## 2017-11-05 NOTE — Telephone Encounter (Signed)
I called [facility], but they do not answer the phone.   Before I would possibly fill any additional pain medications, I need for someone from their office to explain to [place] directly what has occurred. My understanding was that Dr. Hardin Negus would be seeing him again.  Can you call them and clarify with anyone you can reach. I need to now if / when he has a follow-up appointment with them.

## 2017-11-11 ENCOUNTER — Telehealth: Payer: Self-pay | Admitting: Family Medicine

## 2017-11-11 MED ORDER — AZITHROMYCIN 250 MG PO TABS: ORAL_TABLET | ORAL | 0 refills | Status: DC

## 2017-11-11 NOTE — Telephone Encounter (Signed)
Copied from [place] [id number]. Topic: Quick Communication - Rx Refill/Question >> Nov 11, 2017  1:56 PM [name] wrote: Medication: abx  Pt states he is cough a yellow phlem; due to pt history of lung cancer it was stated his pcp would usually call in a abx, call pt to advise  Preferred Pharmacy (with phone number or street name[facility] [place] [place] on [address]: Please be advised that RX refills may take up to 3 business days. We ask that you follow-up with your pharmacy.

## 2017-11-11 NOTE — Telephone Encounter (Signed)
[facility] [place] as instructed by Dr. Lorelei Pont.  [name] notified by telephone.

## 2017-11-11 NOTE — Telephone Encounter (Signed)
Given his history, I am ok with that and that he is out of town.  zpak 2 tabs po on day 1, then 1 tab po for 4 days

## 2017-12-08 ENCOUNTER — Other Ambulatory Visit: Payer: Self-pay | Admitting: Family Medicine

## 2017-12-09 DIAGNOSIS — Z79891 Long term (current) use of opiate analgesic: Secondary | ICD-10-CM | POA: Diagnosis not present

## 2017-12-09 DIAGNOSIS — M47812 Spondylosis without myelopathy or radiculopathy, cervical region: Secondary | ICD-10-CM | POA: Diagnosis not present

## 2017-12-09 DIAGNOSIS — G894 Chronic pain syndrome: Secondary | ICD-10-CM | POA: Diagnosis not present

## 2017-12-09 DIAGNOSIS — M961 Postlaminectomy syndrome, not elsewhere classified: Secondary | ICD-10-CM | POA: Diagnosis not present

## 2017-12-09 NOTE — Telephone Encounter (Signed)
Electronic refill request Last office visit 09/16/17 [place] refill 07/13/17 #60/3

## 2017-12-22 ENCOUNTER — Telehealth: Payer: Self-pay | Admitting: *Deleted

## 2017-12-22 ENCOUNTER — Other Ambulatory Visit: Payer: Self-pay | Admitting: Family Medicine

## 2017-12-22 NOTE — Telephone Encounter (Signed)
Oncology Nurse Navigator Documentation  Oncology Nurse Navigator Flowsheets 12/22/2017  Navigator Location [facility]-  Navigator Encounter Type Telephone/I checked on [name]'s schedule.  He is not scheduled for his CT chest.  I called and updated on central scheduling phone number to call and schedule this scan before 12/28/17.    Telephone Outgoing Call  Treatment Phase Follow-up  Barriers/Navigation Needs Education;Coordination of Care  Education Other  Interventions Coordination of Care;Education  Coordination of Care Other  Education Method Verbal  Acuity Level 1  Time Spent with Patient 15
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
## 2017-12-23 ENCOUNTER — Encounter: Payer: Self-pay | Admitting: Internal Medicine

## 2017-12-23 ENCOUNTER — Telehealth: Payer: Self-pay | Admitting: Internal Medicine

## 2017-12-23 ENCOUNTER — Ambulatory Visit: Payer: Medicare HMO | Admitting: Internal Medicine

## 2017-12-23 VITALS — BP 134/80 | HR 77 | Ht 70.0 in | Wt 163.0 lb

## 2017-12-23 DIAGNOSIS — E785 Hyperlipidemia, unspecified: Secondary | ICD-10-CM

## 2017-12-23 DIAGNOSIS — I2510 Atherosclerotic heart disease of native coronary artery without angina pectoris: Secondary | ICD-10-CM | POA: Diagnosis not present

## 2017-12-23 DIAGNOSIS — I10 Essential (primary) hypertension: Secondary | ICD-10-CM

## 2017-12-23 MED ORDER — NITROGLYCERIN 0.4 MG SL SUBL: 0.4000 mg | SUBLINGUAL_TABLET | SUBLINGUAL | 4 refills | Status: AC | PRN

## 2017-12-23 NOTE — Telephone Encounter (Signed)
Patients wife called wanting lab closer to ct scan on 8/2

## 2017-12-23 NOTE — Patient Instructions (Signed)
Medication Instructions:  Your physician recommends that you continue on your current medications as directed. Please refer to the Current Medication list given to you today.   Labwork: Your physician recommends that you return for lab work in: TODAY (LIPID).   Testing/Procedures: none  Follow-Up: Your physician recommends that you schedule a follow-up appointment in: [place].  If you need a refill on your cardiac medications before your next appointment, please call your pharmacy.

## 2017-12-24 ENCOUNTER — Telehealth: Payer: Self-pay | Admitting: Internal Medicine

## 2017-12-24 ENCOUNTER — Encounter: Payer: Self-pay | Admitting: Internal Medicine

## 2017-12-24 LAB — LIPID PANEL
CHOLESTEROL TOTAL: 132 mg/dL (ref 100–199)
Chol/HDL Ratio: 2.9 ratio (ref 0.0–5.0)
HDL: 45 mg/dL (ref >39)
LDL CALC: 49 mg/dL (ref 0–99)
Triglycerides: 189 mg/dL — ABNORMAL HIGH (ref 0–149)
VLDL Cholesterol Cal: 38 mg/dL (ref 5–40)

## 2017-12-24 NOTE — Telephone Encounter (Signed)
Faxed medical records to [facility] at [phone], Release ID: [id number]

## 2017-12-25 ENCOUNTER — Inpatient Hospital Stay: Payer: Medicare HMO

## 2017-12-25 ENCOUNTER — Ambulatory Visit (HOSPITAL_COMMUNITY)
Admission: RE | Admit: 2017-12-25 | Discharge: 2017-12-25 | Disposition: A | Discharge status: Home / Self Care | Payer: Medicare HMO | Source: Ambulatory Visit | Attending: Internal Medicine | Admitting: Internal Medicine

## 2017-12-25 ENCOUNTER — Inpatient Hospital Stay: Payer: Medicare HMO | Attending: Internal Medicine

## 2017-12-25 DIAGNOSIS — Z85118 Personal history of other malignant neoplasm of bronchus and lung: Secondary | ICD-10-CM | POA: Diagnosis not present

## 2017-12-25 DIAGNOSIS — J479 Bronchiectasis, uncomplicated: Secondary | ICD-10-CM | POA: Diagnosis not present

## 2017-12-25 DIAGNOSIS — R911 Solitary pulmonary nodule: Secondary | ICD-10-CM | POA: Diagnosis present

## 2017-12-25 DIAGNOSIS — C3490 Malignant neoplasm of unspecified part of unspecified bronchus or lung: Secondary | ICD-10-CM | POA: Diagnosis not present

## 2017-12-25 DIAGNOSIS — I10 Essential (primary) hypertension: Secondary | ICD-10-CM | POA: Insufficient documentation

## 2017-12-25 DIAGNOSIS — C3491 Malignant neoplasm of unspecified part of right bronchus or lung: Secondary | ICD-10-CM | POA: Diagnosis not present

## 2017-12-25 LAB — CBC WITH DIFFERENTIAL (CANCER CENTER ONLY)
Basophils Absolute: 0.1 10*3/uL (ref 0.0–0.1)
Basophils Relative: 1 %
Eosinophils Absolute: 0.1 10*3/uL (ref 0.0–0.5)
Eosinophils Relative: 1 %
HCT: 46.2 % (ref 38.4–49.9)
Hemoglobin: 15.1 g/dL (ref 13.0–17.1)
LYMPHS ABS: 3 10*3/uL (ref 0.9–3.3)
LYMPHS PCT: 29 %
MCH: 30.1 pg (ref 27.2–33.4)
MCHC: 32.7 g/dL (ref 32.0–36.0)
MCV: 92 fL (ref 79.3–98.0)
Monocytes Absolute: 1 10*3/uL — ABNORMAL HIGH (ref 0.1–0.9)
Monocytes Relative: 10 %
NEUTROS PCT: 59 %
Neutro Abs: 6.1 10*3/uL (ref 1.5–6.5)
PLATELETS: 202 10*3/uL (ref 140–400)
RBC: 5.02 MIL/uL (ref 4.20–5.82)
RDW: 14.6 % (ref 11.0–14.6)
WBC: 10.3 10*3/uL (ref 4.0–10.3)

## 2017-12-25 LAB — CMP (CANCER CENTER ONLY)
ALT: 7 U/L (ref 0–44)
AST: 19 U/L (ref 15–41)
Albumin: 3.9 g/dL (ref 3.5–5.0)
Alkaline Phosphatase: 93 U/L (ref 38–126)
Anion gap: 10 (ref 5–15)
BUN: 18 mg/dL (ref 8–23)
CHLORIDE: 103 mmol/L (ref 98–111)
CO2: 26 mmol/L (ref 22–32)
Calcium: 9.2 mg/dL (ref 8.9–10.3)
Creatinine: 1.81 mg/dL — ABNORMAL HIGH (ref 0.61–1.24)
GFR, EST AFRICAN AMERICAN: 43 mL/min — AB (ref >60)
GFR, EST NON AFRICAN AMERICAN: 37 mL/min — AB (ref >60)
Glucose, Bld: 104 mg/dL — ABNORMAL HIGH (ref 70–99)
POTASSIUM: 3.9 mmol/L (ref 3.5–5.1)
Sodium: 139 mmol/L (ref 135–145)
Total Bilirubin: 0.7 mg/dL (ref 0.3–1.2)
Total Protein: 7.1 g/dL (ref 6.5–8.1)

## 2017-12-25 MED ORDER — IOHEXOL 300 MG/ML  SOLN
60.0000 mL | Freq: Once | INTRAMUSCULAR | Status: AC | PRN
  Administered 2017-12-25: 60 mL via INTRAVENOUS

## 2017-12-28 ENCOUNTER — Inpatient Hospital Stay: Payer: Medicare HMO | Admitting: Internal Medicine

## 2017-12-28 ENCOUNTER — Encounter: Payer: Self-pay | Admitting: Internal Medicine

## 2017-12-28 DIAGNOSIS — Z85118 Personal history of other malignant neoplasm of bronchus and lung: Secondary | ICD-10-CM

## 2017-12-28 DIAGNOSIS — I10 Essential (primary) hypertension: Secondary | ICD-10-CM | POA: Diagnosis not present

## 2017-12-28 DIAGNOSIS — C3490 Malignant neoplasm of unspecified part of unspecified bronchus or lung: Secondary | ICD-10-CM

## 2017-12-28 DIAGNOSIS — R918 Other nonspecific abnormal finding of lung field: Secondary | ICD-10-CM | POA: Diagnosis not present

## 2017-12-28 DIAGNOSIS — R911 Solitary pulmonary nodule: Secondary | ICD-10-CM | POA: Diagnosis not present
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
## 2018-01-08 DIAGNOSIS — Z79891 Long term (current) use of opiate analgesic: Secondary | ICD-10-CM | POA: Diagnosis not present

## 2018-01-08 DIAGNOSIS — M961 Postlaminectomy syndrome, not elsewhere classified: Secondary | ICD-10-CM | POA: Diagnosis not present

## 2018-01-08 DIAGNOSIS — M47812 Spondylosis without myelopathy or radiculopathy, cervical region: Secondary | ICD-10-CM | POA: Diagnosis not present

## 2018-01-08 DIAGNOSIS — G894 Chronic pain syndrome: Secondary | ICD-10-CM | POA: Diagnosis not present

## 2018-01-11 ENCOUNTER — Other Ambulatory Visit: Payer: Self-pay | Admitting: Family Medicine

## 2018-02-04 ENCOUNTER — Other Ambulatory Visit: Payer: Self-pay | Admitting: Family Medicine

## 2018-02-05 DIAGNOSIS — M961 Postlaminectomy syndrome, not elsewhere classified: Secondary | ICD-10-CM | POA: Diagnosis not present

## 2018-02-05 DIAGNOSIS — Z79891 Long term (current) use of opiate analgesic: Secondary | ICD-10-CM | POA: Diagnosis not present

## 2018-02-05 DIAGNOSIS — M47812 Spondylosis without myelopathy or radiculopathy, cervical region: Secondary | ICD-10-CM | POA: Diagnosis not present

## 2018-02-05 DIAGNOSIS — G894 Chronic pain syndrome: Secondary | ICD-10-CM | POA: Diagnosis not present

## 2018-02-19 ENCOUNTER — Telehealth: Payer: Self-pay | Admitting: Family Medicine

## 2018-02-19 MED ORDER — AZITHROMYCIN 250 MG PO TABS: ORAL_TABLET | ORAL | 0 refills | Status: DC

## 2018-02-19 NOTE — Telephone Encounter (Signed)
Artice has lung cancer. If he is in [place], I am ok with that.   But if he doesn't get better or worsens, definitely should be seen  zpak 2 tabs po on day 1, then 1 tab po for 4 days  #6

## 2018-02-19 NOTE — Telephone Encounter (Signed)
Pt last seen 09/16/17 for pain mgt. Unable to reach pt or pts wife for more info about symptoms.

## 2018-02-19 NOTE — Telephone Encounter (Signed)
Copied from [place] [phone]. Topic: Quick Communication - See Telephone Encounter >> Feb 19, 2018 10:27 AM Conception Chancy, NT wrote: CRM for notification. See Telephone encounter for: 02/19/18.  Patient wife is calling to see if Dr. Lorelei Pont will call patient in a antibiotic for a cold. She states he has had a cough for the past couple of days. They are in [place] and she said Dr. Lorelei Pont usually calls in RX without him coming in. Please advise.  [id number] [address], [place] [address] Phone: [phone] Fax: [phone]

## 2018-02-19 NOTE — Telephone Encounter (Signed)
[facility] [place] as instructed by Dr. Lorelei Pont.  [name] notified as instructed by telephone.

## 2018-03-05 DIAGNOSIS — M961 Postlaminectomy syndrome, not elsewhere classified: Secondary | ICD-10-CM | POA: Diagnosis not present

## 2018-03-05 DIAGNOSIS — M47812 Spondylosis without myelopathy or radiculopathy, cervical region: Secondary | ICD-10-CM | POA: Diagnosis not present

## 2018-03-05 DIAGNOSIS — G894 Chronic pain syndrome: Secondary | ICD-10-CM | POA: Diagnosis not present

## 2018-03-05 DIAGNOSIS — Z79891 Long term (current) use of opiate analgesic: Secondary | ICD-10-CM | POA: Diagnosis not present

## 2018-04-01 DIAGNOSIS — R69 Illness, unspecified: Secondary | ICD-10-CM | POA: Diagnosis not present

## 2018-04-02 DIAGNOSIS — M961 Postlaminectomy syndrome, not elsewhere classified: Secondary | ICD-10-CM | POA: Diagnosis not present

## 2018-04-02 DIAGNOSIS — Z79891 Long term (current) use of opiate analgesic: Secondary | ICD-10-CM | POA: Diagnosis not present

## 2018-04-02 DIAGNOSIS — G894 Chronic pain syndrome: Secondary | ICD-10-CM | POA: Diagnosis not present

## 2018-04-02 DIAGNOSIS — M47812 Spondylosis without myelopathy or radiculopathy, cervical region: Secondary | ICD-10-CM | POA: Diagnosis not present

## 2018-04-05 DIAGNOSIS — H43811 Vitreous degeneration, right eye: Secondary | ICD-10-CM | POA: Diagnosis not present

## 2018-04-14 DIAGNOSIS — H43813 Vitreous degeneration, bilateral: Secondary | ICD-10-CM | POA: Diagnosis not present

## 2018-04-14 DIAGNOSIS — H4311 Vitreous hemorrhage, right eye: Secondary | ICD-10-CM | POA: Diagnosis not present

## 2018-04-15 ENCOUNTER — Other Ambulatory Visit: Payer: Self-pay | Admitting: Family Medicine

## 2018-04-15 NOTE — Telephone Encounter (Signed)
Last office visit 09/16/2017 for Pain Management.  Last refilled 12/10/2017 for #60 with 3 refills. No future appointments.

## 2018-04-26 DIAGNOSIS — L218 Other seborrheic dermatitis: Secondary | ICD-10-CM | POA: Diagnosis not present

## 2018-04-26 DIAGNOSIS — L821 Other seborrheic keratosis: Secondary | ICD-10-CM | POA: Diagnosis not present

## 2018-04-26 DIAGNOSIS — H61031 Chondritis of right external ear: Secondary | ICD-10-CM | POA: Diagnosis not present

## 2018-05-04 DIAGNOSIS — M47812 Spondylosis without myelopathy or radiculopathy, cervical region: Secondary | ICD-10-CM | POA: Diagnosis not present

## 2018-05-04 DIAGNOSIS — G894 Chronic pain syndrome: Secondary | ICD-10-CM | POA: Diagnosis not present

## 2018-05-04 DIAGNOSIS — M961 Postlaminectomy syndrome, not elsewhere classified: Secondary | ICD-10-CM | POA: Diagnosis not present

## 2018-05-04 DIAGNOSIS — Z79891 Long term (current) use of opiate analgesic: Secondary | ICD-10-CM | POA: Diagnosis not present

## 2018-05-12 DIAGNOSIS — H43813 Vitreous degeneration, bilateral: Secondary | ICD-10-CM | POA: Diagnosis not present

## 2018-05-12 DIAGNOSIS — H4311 Vitreous hemorrhage, right eye: Secondary | ICD-10-CM | POA: Diagnosis not present

## 2018-05-27 ENCOUNTER — Other Ambulatory Visit: Payer: Self-pay | Admitting: Family Medicine

## 2018-05-27 NOTE — Telephone Encounter (Signed)
Last office visit 09/16/2017 for Failed back syndrome.  Last refilled 04/16/2018 for #60 with no refills.  UDS/Contract 08/05/2017.  No future appointments.

## 2018-06-03 DIAGNOSIS — M961 Postlaminectomy syndrome, not elsewhere classified: Secondary | ICD-10-CM | POA: Diagnosis not present

## 2018-06-03 DIAGNOSIS — G894 Chronic pain syndrome: Secondary | ICD-10-CM | POA: Diagnosis not present

## 2018-06-03 DIAGNOSIS — Z79891 Long term (current) use of opiate analgesic: Secondary | ICD-10-CM | POA: Diagnosis not present

## 2018-06-03 DIAGNOSIS — M47812 Spondylosis without myelopathy or radiculopathy, cervical region: Secondary | ICD-10-CM | POA: Diagnosis not present

## 2018-06-23 ENCOUNTER — Other Ambulatory Visit: Payer: Self-pay | Admitting: Family Medicine

## 2018-06-28 ENCOUNTER — Inpatient Hospital Stay: Payer: Medicare HMO

## 2018-06-30 ENCOUNTER — Inpatient Hospital Stay: Payer: Medicare HMO | Admitting: Internal Medicine

## 2018-06-30 ENCOUNTER — Telehealth: Payer: Self-pay | Admitting: Medical Oncology

## 2018-06-30 NOTE — Telephone Encounter (Signed)
Called pt ,spoke to his wife and told her that his ct and f/u will be moved out to august.If he has any problems or concerns to call us.

## 2018-07-01 DIAGNOSIS — Z79891 Long term (current) use of opiate analgesic: Secondary | ICD-10-CM | POA: Diagnosis not present

## 2018-07-01 DIAGNOSIS — M47812 Spondylosis without myelopathy or radiculopathy, cervical region: Secondary | ICD-10-CM | POA: Diagnosis not present

## 2018-07-01 DIAGNOSIS — M961 Postlaminectomy syndrome, not elsewhere classified: Secondary | ICD-10-CM | POA: Diagnosis not present

## 2018-07-01 DIAGNOSIS — G894 Chronic pain syndrome: Secondary | ICD-10-CM | POA: Diagnosis not present

## 2018-07-02 ENCOUNTER — Telehealth: Payer: Self-pay | Admitting: Internal Medicine

## 2018-07-02 ENCOUNTER — Telehealth: Payer: Self-pay

## 2018-07-02 NOTE — Telephone Encounter (Signed)
Pt needs appt to be seen.

## 2018-07-02 NOTE — Telephone Encounter (Signed)
I spoke to [name]'s wife [name] on the phone. He may be a little better today, but rapid onset last night, prod cough, fever, ? Arthralgia.  My recommendation to them was for him to be seen at urgent care in the morning to better evaluate him - rule out flu, PNA, etc.

## 2018-07-02 NOTE — Telephone Encounter (Signed)
Pt's wife, [name] (on dpr) is calling requesting abx for pt.  States he started last night coughing up yellow mucous and had fever of 101. States Dr. Lorelei Pont usually sends abx due to pt's h/o lung CA.  Asks that abx be sent to [facility] on [address].  Call back #, [phone].

## 2018-07-02 NOTE — Telephone Encounter (Signed)
R/s appt per 2/5 sch message - pt wife is aware of appts.

## 2018-07-08 ENCOUNTER — Telehealth: Payer: Self-pay | Admitting: *Deleted

## 2018-07-08 NOTE — Telephone Encounter (Signed)
3/13 CT scan moved to aug d/t insurance will only pay for scan annually.  Last Ct Chest 12/25/17.  New appt 8/4 lab/CT MD follow up 8/6. Unable to reach pt. lmovm with appt dates/times with request to call back to confirm message was received.

## 2018-07-11 ENCOUNTER — Other Ambulatory Visit: Payer: Self-pay | Admitting: Family Medicine

## 2018-07-27 ENCOUNTER — Telehealth: Payer: Self-pay | Admitting: Oncology

## 2018-07-27 NOTE — Telephone Encounter (Signed)
[name] contacted to obtain verbal, telephone consent to share their name and contact information with [employer], product/process development and team) for purposes of soliciting patient experience feedback.  Verbal consent obtained and documented on "[name] INFORMATION" form.  [name] is aware that [facility] will be in contact with them at a future date for screening purposes for interviews.  Please direct questions related to this process to Sherry Ruffing via email at [email or web address] or extension [phone].

## 2018-07-28 ENCOUNTER — Other Ambulatory Visit: Payer: Self-pay | Admitting: Family Medicine

## 2018-07-30 DIAGNOSIS — Z79891 Long term (current) use of opiate analgesic: Secondary | ICD-10-CM | POA: Diagnosis not present

## 2018-07-30 DIAGNOSIS — M47812 Spondylosis without myelopathy or radiculopathy, cervical region: Secondary | ICD-10-CM | POA: Diagnosis not present

## 2018-07-30 DIAGNOSIS — M961 Postlaminectomy syndrome, not elsewhere classified: Secondary | ICD-10-CM | POA: Diagnosis not present

## 2018-07-30 DIAGNOSIS — G894 Chronic pain syndrome: Secondary | ICD-10-CM | POA: Diagnosis not present

## 2018-08-06 ENCOUNTER — Other Ambulatory Visit: Payer: Medicare HMO

## 2018-08-06 ENCOUNTER — Ambulatory Visit (HOSPITAL_COMMUNITY): Payer: Medicare HMO

## 2018-08-09 ENCOUNTER — Ambulatory Visit: Payer: Medicare HMO | Admitting: Internal Medicine

## 2018-08-12 ENCOUNTER — Other Ambulatory Visit: Payer: Self-pay | Admitting: Internal Medicine

## 2018-08-18 ENCOUNTER — Ambulatory Visit: Payer: Medicare HMO

## 2018-08-18 ENCOUNTER — Other Ambulatory Visit: Payer: Medicare HMO

## 2018-08-23 ENCOUNTER — Encounter: Payer: Medicare HMO | Admitting: Family Medicine

## 2018-08-26 DIAGNOSIS — G894 Chronic pain syndrome: Secondary | ICD-10-CM | POA: Diagnosis not present

## 2018-08-26 DIAGNOSIS — M47812 Spondylosis without myelopathy or radiculopathy, cervical region: Secondary | ICD-10-CM | POA: Diagnosis not present

## 2018-08-26 DIAGNOSIS — Z79891 Long term (current) use of opiate analgesic: Secondary | ICD-10-CM | POA: Diagnosis not present

## 2018-08-26 DIAGNOSIS — M961 Postlaminectomy syndrome, not elsewhere classified: Secondary | ICD-10-CM | POA: Diagnosis not present

## 2018-08-31 ENCOUNTER — Telehealth: Payer: Self-pay

## 2018-08-31 NOTE — Telephone Encounter (Signed)
Can we clarify if [name] and [name] moved to [place]?  I have not seen him in a year, and all of his refills have been going to [place].

## 2018-08-31 NOTE — Telephone Encounter (Signed)
Inbound call to triage - patient requested medication refills as follows:   Clonazepam Methocarbamol  Patient would like medication sent to [facility], [address], [place], [place] [address], [phone].

## 2018-08-31 NOTE — Telephone Encounter (Signed)
Spoke with [employer].  They have moved to [facility] but are keeping Dr. Lorelei Pont as their PCP.

## 2018-09-01 MED ORDER — METHOCARBAMOL 500 MG PO TABS: 500.0000 mg | ORAL_TABLET | Freq: Four times a day (QID) | ORAL | 3 refills | Status: DC | PRN

## 2018-09-01 MED ORDER — CLONAZEPAM 0.5 MG PO TABS: 0.5000 mg | ORAL_TABLET | Freq: Two times a day (BID) | ORAL | 3 refills | Status: DC | PRN

## 2018-09-01 NOTE — Telephone Encounter (Signed)
[name] and I were talking about medical clinics near [place].  "[facility]" - they have some well-educated doctors who went to good medical schools and residencies  "[place] [facility]" - same as above

## 2018-09-02 NOTE — Telephone Encounter (Signed)
done

## 2018-09-02 NOTE — Telephone Encounter (Signed)
Noted.  Are you refilling his medication?

## 2018-09-14 ENCOUNTER — Other Ambulatory Visit: Payer: Self-pay

## 2018-09-14 NOTE — Telephone Encounter (Signed)
[name] [facility] signed) left v/m requesting refill lactulose to help pt have BMs to [facility].   Name of Medication: Lactulose 10 gm/15 ml Name of Pharmacy: [facility] or Written Date and Quantity:#240 ml on 12/22/17  Last Office Visit and Type: 09/16/17 pain mgt Next Office Visit and Type: 11/29/18 CPX  On 05/06/17 pt reestablished with Dr Lorelei Pont, pt has moved to [place] but per 08/31/18 phone note pt plans to keep Dr Lorelei Pont as PCP.Please advise.

## 2018-09-15 MED ORDER — LACTULOSE 10 GM/15ML PO SOLN: 10.0000 g | Freq: Three times a day (TID) | ORAL | 0 refills | Status: DC

## 2018-09-22 ENCOUNTER — Other Ambulatory Visit: Payer: Self-pay | Admitting: Family Medicine

## 2018-09-23 DIAGNOSIS — M47817 Spondylosis without myelopathy or radiculopathy, lumbosacral region: Secondary | ICD-10-CM | POA: Diagnosis not present

## 2018-09-23 DIAGNOSIS — M47812 Spondylosis without myelopathy or radiculopathy, cervical region: Secondary | ICD-10-CM | POA: Diagnosis not present

## 2018-09-23 DIAGNOSIS — G894 Chronic pain syndrome: Secondary | ICD-10-CM | POA: Diagnosis not present

## 2018-09-23 DIAGNOSIS — Z79891 Long term (current) use of opiate analgesic: Secondary | ICD-10-CM | POA: Diagnosis not present

## 2018-09-23 DIAGNOSIS — M961 Postlaminectomy syndrome, not elsewhere classified: Secondary | ICD-10-CM | POA: Diagnosis not present

## 2018-09-30 ENCOUNTER — Other Ambulatory Visit: Payer: Self-pay | Admitting: Family Medicine

## 2018-09-30 NOTE — Telephone Encounter (Signed)
Last office visit 09/16/2017 for failed back syndrome.  Last refilled 09/16/2017 for #90 with 5 refills.  CPE scheduled for 11/29/2018.  Ok to refill?

## 2018-10-04 ENCOUNTER — Other Ambulatory Visit: Payer: Self-pay | Admitting: Family Medicine

## 2018-10-19 ENCOUNTER — Other Ambulatory Visit: Payer: Self-pay | Admitting: Family Medicine

## 2018-10-21 DIAGNOSIS — G894 Chronic pain syndrome: Secondary | ICD-10-CM | POA: Diagnosis not present

## 2018-10-21 DIAGNOSIS — Z79891 Long term (current) use of opiate analgesic: Secondary | ICD-10-CM | POA: Diagnosis not present

## 2018-10-21 DIAGNOSIS — M961 Postlaminectomy syndrome, not elsewhere classified: Secondary | ICD-10-CM | POA: Diagnosis not present

## 2018-10-21 DIAGNOSIS — M47812 Spondylosis without myelopathy or radiculopathy, cervical region: Secondary | ICD-10-CM | POA: Diagnosis not present

## 2018-11-03 ENCOUNTER — Telehealth: Payer: Self-pay | Admitting: *Deleted

## 2018-11-03 MED ORDER — FAMOTIDINE 20 MG PO TABS: 20.0000 mg | ORAL_TABLET | Freq: Two times a day (BID) | ORAL | 5 refills | Status: DC

## 2018-11-03 NOTE — Telephone Encounter (Signed)
Please call:  I would not use Omeprazole.  It interacts with Plavix and decreases the plavix level.  Broadly, no one recommends using them together.   An H2 blocker probably has the least risk - like pepcid  The lowest risk PPI is protonix, and that would also be reasonable if he prefers that

## 2018-11-03 NOTE — Telephone Encounter (Signed)
pepcid ac as substitute. OTC it is about 5 dollars

## 2018-11-03 NOTE — Telephone Encounter (Signed)
Patient's wife called stating that patient is on Ranitidine and they have been hearing on the TV that it can cause cancer. Patient's wife wants the medication to something else for his acid reflux. Pharmacy [facility]

## 2018-11-03 NOTE — Telephone Encounter (Signed)
Left message for [name] that Yandriel should not use Omeprazole.  It interacts with Plavix and decreases the plavix level.  I advised that Dr. Lorelei Pont has sent in a prescription for Pepcid 20 mg to [facility] and Mr. Kreiser can take one tablet twice a day.  I ask that she call us back if she has any questions.

## 2018-11-03 NOTE — Telephone Encounter (Signed)
Can Pepcid be sent in as an Rx?

## 2018-11-03 NOTE — Telephone Encounter (Signed)
Yes, sent

## 2018-11-03 NOTE — Telephone Encounter (Signed)
[name] notified as instructed by telephone.  She states it is cheaper if Dr. Lorelei [name] would send in a prescription for something.  She states [name] has taking some of her omeprazole 40 mg and that seems to work for him.   Please advise.

## 2018-11-05 NOTE — Telephone Encounter (Signed)
Patient's wife left a voicemail stating that she thinks the same medication Ranitidine was sent in for the patient and requested a call back.  Called back and advised patient that Pepcid was sent in and not Ranitidine and he verbalized understanding.

## 2018-11-19 DIAGNOSIS — Z79891 Long term (current) use of opiate analgesic: Secondary | ICD-10-CM | POA: Diagnosis not present

## 2018-11-19 DIAGNOSIS — G894 Chronic pain syndrome: Secondary | ICD-10-CM | POA: Diagnosis not present

## 2018-11-19 DIAGNOSIS — M47812 Spondylosis without myelopathy or radiculopathy, cervical region: Secondary | ICD-10-CM | POA: Diagnosis not present

## 2018-11-19 DIAGNOSIS — M961 Postlaminectomy syndrome, not elsewhere classified: Secondary | ICD-10-CM | POA: Diagnosis not present

## 2018-11-24 ENCOUNTER — Telehealth: Payer: Self-pay

## 2018-11-24 NOTE — Telephone Encounter (Signed)
Spouse r/s to 9/17

## 2018-11-24 NOTE — Telephone Encounter (Signed)
[name] Pts wife (DPR signed) left v/m wanting to know if pt is to come in office or virtual for cpx on 11/29/18 and wants to know what 11/25/18 appt is for. [name] request cb.

## 2018-11-25 ENCOUNTER — Other Ambulatory Visit: Payer: Medicare HMO

## 2018-11-29 ENCOUNTER — Encounter: Payer: Medicare HMO | Admitting: Family Medicine

## 2018-12-02 ENCOUNTER — Other Ambulatory Visit: Payer: Self-pay | Admitting: Family Medicine

## 2018-12-02 DIAGNOSIS — E1065 Type 1 diabetes mellitus with hyperglycemia: Secondary | ICD-10-CM

## 2018-12-02 DIAGNOSIS — Z131 Encounter for screening for diabetes mellitus: Secondary | ICD-10-CM

## 2018-12-02 DIAGNOSIS — Z125 Encounter for screening for malignant neoplasm of prostate: Secondary | ICD-10-CM

## 2018-12-02 DIAGNOSIS — Z79899 Other long term (current) drug therapy: Secondary | ICD-10-CM

## 2018-12-02 DIAGNOSIS — E785 Hyperlipidemia, unspecified: Secondary | ICD-10-CM

## 2018-12-20 DIAGNOSIS — M961 Postlaminectomy syndrome, not elsewhere classified: Secondary | ICD-10-CM | POA: Diagnosis not present

## 2018-12-20 DIAGNOSIS — M47812 Spondylosis without myelopathy or radiculopathy, cervical region: Secondary | ICD-10-CM | POA: Diagnosis not present

## 2018-12-20 DIAGNOSIS — G894 Chronic pain syndrome: Secondary | ICD-10-CM | POA: Diagnosis not present

## 2018-12-20 DIAGNOSIS — Z79891 Long term (current) use of opiate analgesic: Secondary | ICD-10-CM | POA: Diagnosis not present

## 2018-12-27 ENCOUNTER — Other Ambulatory Visit: Payer: Self-pay | Admitting: Family Medicine

## 2018-12-27 ENCOUNTER — Other Ambulatory Visit: Payer: Self-pay | Admitting: Medical Oncology

## 2018-12-27 DIAGNOSIS — C3490 Malignant neoplasm of unspecified part of unspecified bronchus or lung: Secondary | ICD-10-CM

## 2018-12-28 ENCOUNTER — Telehealth: Payer: Self-pay | Admitting: Internal Medicine

## 2018-12-28 ENCOUNTER — Ambulatory Visit (HOSPITAL_COMMUNITY): Admission: RE | Admit: 2018-12-28 | Payer: Medicare HMO | Source: Ambulatory Visit

## 2018-12-28 ENCOUNTER — Inpatient Hospital Stay: Payer: Medicare HMO | Attending: Internal Medicine

## 2018-12-28 NOTE — Telephone Encounter (Signed)
Called pt per 8/04 sch message - no answer . Left message for patient to call back to reschedule appt

## 2018-12-29 ENCOUNTER — Telehealth (HOSPITAL_COMMUNITY): Payer: Self-pay

## 2018-12-30 ENCOUNTER — Inpatient Hospital Stay: Payer: Medicare HMO | Admitting: Internal Medicine

## 2018-12-30 ENCOUNTER — Telehealth: Payer: Self-pay | Admitting: Medical Oncology

## 2018-12-30 NOTE — Telephone Encounter (Signed)
LVM on pt mobile phone to call re his appt.today .

## 2018-12-30 NOTE — Telephone Encounter (Signed)
Received call back from pt's wife.  She states she had called to cancel his appt today.  He had been feeling bad with fever, cough.  He did not want to see his pcp. Right now, he is feeling better. Advised wife to call to get his CT scan re-scheduled. Provided # to [facility] scheduling. Asked wife to call to back to [place] after she has re-scheduled CT scan so we can schedule labs and f/u appt with Dr. [name]. She stated she would.  Appt for today is now cancelled

## 2018-12-31 ENCOUNTER — Other Ambulatory Visit: Payer: Self-pay | Admitting: Family Medicine

## 2019-01-07 DIAGNOSIS — H02055 Trichiasis without entropian left lower eyelid: Secondary | ICD-10-CM | POA: Diagnosis not present

## 2019-01-13 ENCOUNTER — Ambulatory Visit (HOSPITAL_COMMUNITY): Admission: RE | Admit: 2019-01-13 | Payer: Medicare HMO | Source: Ambulatory Visit

## 2019-01-17 ENCOUNTER — Other Ambulatory Visit: Payer: Self-pay | Admitting: Family Medicine

## 2019-01-25 ENCOUNTER — Other Ambulatory Visit: Payer: Self-pay | Admitting: Family Medicine

## 2019-01-25 ENCOUNTER — Telehealth: Payer: Self-pay | Admitting: Medical Oncology

## 2019-01-25 NOTE — Telephone Encounter (Signed)
Last office visit 09/21/2017 for Pain Management.  Last refilled 09/01/2018 for #60 with 3 refills. CPE scheduled for 02/09/2019.

## 2019-01-25 NOTE — Telephone Encounter (Addendum)
spoke with wife and gave her number to central scheduling to make appt for Ct scan. Schedule message sent to schedule labs and f/u appt.

## 2019-01-28 ENCOUNTER — Telehealth: Payer: Self-pay | Admitting: Internal Medicine

## 2019-01-28 NOTE — Telephone Encounter (Signed)
Scheduled appt for f/u after scan per 9/1 sch message and staff message from [facility] - pt wife is aware of appt date and time

## 2019-02-03 ENCOUNTER — Telehealth: Payer: Self-pay

## 2019-02-03 NOTE — Telephone Encounter (Signed)
[name] [name] signed) left v/m requesting refill on citalopram to [facility]. I spoke with Tony,pharmacist at [facility] and pt has available refill for citalopram at that pharmacy. I spoke withi [name] and advised per [facility]. [name] said will pick up at [place]. Nothing further needed.

## 2019-02-04 ENCOUNTER — Telehealth: Payer: Self-pay | Admitting: Family Medicine

## 2019-02-04 ENCOUNTER — Ambulatory Visit (HOSPITAL_COMMUNITY): Admission: RE | Admit: 2019-02-04 | Payer: Medicare HMO | Source: Ambulatory Visit

## 2019-02-04 ENCOUNTER — Telehealth: Payer: Self-pay | Admitting: Medical Oncology

## 2019-02-04 ENCOUNTER — Other Ambulatory Visit: Payer: Medicare HMO

## 2019-02-04 NOTE — Telephone Encounter (Signed)
Left message for pt to call radiology ( phone number given)  if he needs to cancel CT scan today.

## 2019-02-04 NOTE — Telephone Encounter (Signed)
[place]  Patient's wife called about patient being tested for covid.  He had lab appt @ 11, which we cancelled and the appt for the 16th  Patient is coughing and had exposure to 2 different people who tested positive for covid. His wife stated they are heading to [facility] testing site.

## 2019-02-04 NOTE — Telephone Encounter (Signed)
Pt having COVID test tomorrow due to symptoms and positive covideExposure ( grandchildren and daughter). I cancelled labs and CT scan and message sent to r/s f/u for one month

## 2019-02-07 ENCOUNTER — Inpatient Hospital Stay: Payer: Medicare HMO | Admitting: Internal Medicine

## 2019-02-07 ENCOUNTER — Telehealth: Payer: Self-pay | Admitting: Internal Medicine

## 2019-02-07 NOTE — Telephone Encounter (Signed)
Scheduled appt per  9/11 sch message - unable to reach pt . Left message with appt date and time

## 2019-02-09 ENCOUNTER — Encounter: Payer: Medicare HMO | Admitting: Family Medicine

## 2019-02-14 DIAGNOSIS — M961 Postlaminectomy syndrome, not elsewhere classified: Secondary | ICD-10-CM | POA: Diagnosis not present

## 2019-02-14 DIAGNOSIS — G894 Chronic pain syndrome: Secondary | ICD-10-CM | POA: Diagnosis not present

## 2019-02-14 DIAGNOSIS — M47812 Spondylosis without myelopathy or radiculopathy, cervical region: Secondary | ICD-10-CM | POA: Diagnosis not present

## 2019-02-14 DIAGNOSIS — Z79891 Long term (current) use of opiate analgesic: Secondary | ICD-10-CM | POA: Diagnosis not present

## 2019-02-15 NOTE — Telephone Encounter (Signed)
Mrs [facility] signed) left v/m to let Dr Lorelei Pont know that pt did not go to [facility] to be tested for covid on 02/04/19; pt was unable to go. Pt has appt on 02/16/19 at 11AM at an [facility] in [place] to have covid testing done and will have covid results faxed to Dr Lorelei Pont.No cb necessary.

## 2019-02-16 DIAGNOSIS — Z20828 Contact with and (suspected) exposure to other viral communicable diseases: Secondary | ICD-10-CM | POA: Diagnosis not present

## 2019-02-16 DIAGNOSIS — R05 Cough: Secondary | ICD-10-CM | POA: Diagnosis not present

## 2019-02-21 ENCOUNTER — Other Ambulatory Visit: Payer: Self-pay

## 2019-02-21 ENCOUNTER — Encounter: Payer: Self-pay | Admitting: Family Medicine

## 2019-02-21 ENCOUNTER — Ambulatory Visit (INDEPENDENT_AMBULATORY_CARE_PROVIDER_SITE_OTHER): Payer: Medicare HMO | Admitting: Family Medicine

## 2019-02-21 VITALS — BP 140/94 | HR 94 | Temp 98.3°F | Ht 69.0 in | Wt 167.5 lb

## 2019-02-21 DIAGNOSIS — Z87891 Personal history of nicotine dependence: Secondary | ICD-10-CM | POA: Diagnosis not present

## 2019-02-21 DIAGNOSIS — R1310 Dysphagia, unspecified: Secondary | ICD-10-CM | POA: Diagnosis not present

## 2019-02-21 DIAGNOSIS — E785 Hyperlipidemia, unspecified: Secondary | ICD-10-CM | POA: Diagnosis not present

## 2019-02-21 DIAGNOSIS — J439 Emphysema, unspecified: Secondary | ICD-10-CM

## 2019-02-21 DIAGNOSIS — N184 Chronic kidney disease, stage 4 (severe): Secondary | ICD-10-CM

## 2019-02-21 DIAGNOSIS — Z131 Encounter for screening for diabetes mellitus: Secondary | ICD-10-CM | POA: Diagnosis not present

## 2019-02-21 DIAGNOSIS — Z23 Encounter for immunization: Secondary | ICD-10-CM

## 2019-02-21 DIAGNOSIS — Z79899 Other long term (current) drug therapy: Secondary | ICD-10-CM

## 2019-02-21 DIAGNOSIS — Z125 Encounter for screening for malignant neoplasm of prostate: Secondary | ICD-10-CM

## 2019-02-21 DIAGNOSIS — K219 Gastro-esophageal reflux disease without esophagitis: Secondary | ICD-10-CM

## 2019-02-21 LAB — CBC WITH DIFFERENTIAL/PLATELET
Basophils Absolute: 0.1 10*3/uL (ref 0.0–0.1)
Basophils Relative: 0.7 % (ref 0.0–3.0)
Eosinophils Absolute: 0.1 10*3/uL (ref 0.0–0.7)
Eosinophils Relative: 0.7 % (ref 0.0–5.0)
HCT: 50.1 % (ref 39.0–52.0)
Hemoglobin: 16.2 g/dL (ref 13.0–17.0)
Lymphocytes Relative: 27.8 % (ref 12.0–46.0)
Lymphs Abs: 3.4 10*3/uL (ref 0.7–4.0)
MCHC: 32.3 g/dL (ref 30.0–36.0)
MCV: 91.4 fl (ref 78.0–100.0)
Monocytes Absolute: 1.4 10*3/uL — ABNORMAL HIGH (ref 0.1–1.0)
Monocytes Relative: 11.9 % (ref 3.0–12.0)
Neutro Abs: 7.2 10*3/uL (ref 1.4–7.7)
Neutrophils Relative %: 58.9 % (ref 43.0–77.0)
Platelets: 277 10*3/uL (ref 150.0–400.0)
RBC: 5.48 Mil/uL (ref 4.22–5.81)
RDW: 16.3 % — ABNORMAL HIGH (ref 11.5–15.5)
WBC: 12.2 10*3/uL — ABNORMAL HIGH (ref 4.0–10.5)

## 2019-02-21 LAB — BASIC METABOLIC PANEL
BUN: 19 mg/dL (ref 6–23)
CO2: 32 mEq/L (ref 19–32)
Calcium: 10.7 mg/dL — ABNORMAL HIGH (ref 8.4–10.5)
Chloride: 100 mEq/L (ref 96–112)
Creatinine, Ser: 1.9 mg/dL — ABNORMAL HIGH (ref 0.40–1.50)
GFR: 35.37 mL/min — ABNORMAL LOW (ref >60.00)
Glucose, Bld: 95 mg/dL (ref 70–99)
Potassium: 5.2 mEq/L — ABNORMAL HIGH (ref 3.5–5.1)
Sodium: 139 mEq/L (ref 135–145)

## 2019-02-21 LAB — HEPATIC FUNCTION PANEL
ALT: 10 U/L (ref 0–53)
AST: 23 U/L (ref 0–37)
Albumin: 4.4 g/dL (ref 3.5–5.2)
Alkaline Phosphatase: 102 U/L (ref 39–117)
Bilirubin, Direct: 0 mg/dL (ref 0.0–0.3)
Total Bilirubin: 0.4 mg/dL (ref 0.2–1.2)
Total Protein: 8 g/dL (ref 6.0–8.3)

## 2019-02-21 LAB — HEMOGLOBIN A1C: Hgb A1c MFr Bld: 6.6 % — ABNORMAL HIGH (ref 4.6–6.5)

## 2019-02-21 LAB — LIPID PANEL
Cholesterol: 120 mg/dL (ref 0–200)
HDL: 43.6 mg/dL
LDL Cholesterol: 50 mg/dL (ref 0–99)
NonHDL: 76.68
Total CHOL/HDL Ratio: 3
Triglycerides: 131 mg/dL (ref 0.0–149.0)
VLDL: 26.2 mg/dL (ref 0.0–40.0)

## 2019-02-21 MED ORDER — LISINOPRIL 10 MG PO TABS: 10.0000 mg | ORAL_TABLET | Freq: Every day | ORAL | 3 refills | Status: DC

## 2019-02-21 MED ORDER — LACTULOSE ENCEPHALOPATHY 10 GM/15ML PO SOLN: ORAL | 0 refills | Status: DC
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
## 2019-02-22 ENCOUNTER — Encounter: Payer: Self-pay | Admitting: Family Medicine

## 2019-02-22 LAB — PSA, TOTAL WITH REFLEX TO PSA, FREE: PSA, Total: 0.7 ng/mL (ref <=4.0)

## 2019-02-28 ENCOUNTER — Other Ambulatory Visit: Payer: Self-pay | Admitting: Family Medicine

## 2019-02-28 NOTE — Telephone Encounter (Signed)
Last office visit 02/21/2019 for dysphagia.  Last refilled 01/26/2019 for #60 with no refills.  No future appointments with PCP.

## 2019-03-07 ENCOUNTER — Telehealth: Payer: Self-pay | Admitting: *Deleted

## 2019-03-07 NOTE — Telephone Encounter (Signed)
Patient's wife left a voicemail stating that her husband asked her to call to get his recent lab results.

## 2019-03-08 NOTE — Telephone Encounter (Signed)
See note from labs

## 2019-03-14 ENCOUNTER — Inpatient Hospital Stay: Payer: Medicare HMO | Attending: Internal Medicine | Admitting: Internal Medicine

## 2019-03-14 ENCOUNTER — Inpatient Hospital Stay: Payer: Medicare HMO

## 2019-03-14 ENCOUNTER — Telehealth: Payer: Self-pay | Admitting: *Deleted

## 2019-03-14 NOTE — Telephone Encounter (Signed)
TCT patient regarding today's appt.with Dr. [name] and for labs.  Pt did not show for appts.. No answer, no vm messaging system available.

## 2019-03-16 ENCOUNTER — Telehealth: Payer: Self-pay | Admitting: Internal Medicine

## 2019-03-16 NOTE — Telephone Encounter (Signed)
Returned patient's phone call regarding rescheduling an appointment, spoke with patient's wife and 10/19 missed appointment has been rescheduled to 11/09. Provided [facility]'s number for them to schedule CT scan.

## 2019-03-23 ENCOUNTER — Encounter: Payer: Self-pay | Admitting: Family Medicine

## 2019-03-27 ENCOUNTER — Other Ambulatory Visit: Payer: Self-pay | Admitting: Family Medicine

## 2019-03-29 ENCOUNTER — Telehealth: Payer: Self-pay

## 2019-03-29 DIAGNOSIS — E119 Type 2 diabetes mellitus without complications: Secondary | ICD-10-CM

## 2019-03-29 NOTE — Telephone Encounter (Signed)
pts wife (DPR signed) request diabetic meter, strips and lancets to [facility]. [name] will ck with ins co and call back with meter lancets and strips approved by ins co.

## 2019-03-30 DIAGNOSIS — R69 Illness, unspecified: Secondary | ICD-10-CM | POA: Diagnosis not present

## 2019-03-30 DIAGNOSIS — E119 Type 2 diabetes mellitus without complications: Secondary | ICD-10-CM

## 2019-03-30 HISTORY — DX: Type 2 diabetes mellitus without complications: E11.9

## 2019-03-30 MED ORDER — ONETOUCH ULTRA VI STRP: ORAL_STRIP | 3 refills | Status: DC

## 2019-03-30 MED ORDER — ALCOHOL PADS 70 % PADS: MEDICATED_PAD | 3 refills | Status: DC

## 2019-03-30 MED ORDER — ONETOUCH ULTRA 2 W/DEVICE KIT: PACK | 0 refills | Status: DC

## 2019-03-30 MED ORDER — ONETOUCH ULTRA CONTROL VI SOLN: 0 refills | Status: DC

## 2019-03-30 MED ORDER — ONETOUCH ULTRASOFT LANCETS MISC: 3 refills | Status: DC

## 2019-03-30 NOTE — Telephone Encounter (Signed)
Per [employer], they cover One Touch Ultra glucose testing supplies.  Prescriptions sent into [facility] as requested.

## 2019-03-31 ENCOUNTER — Ambulatory Visit (HOSPITAL_COMMUNITY): Payer: Medicare HMO

## 2019-04-01 ENCOUNTER — Ambulatory Visit (HOSPITAL_COMMUNITY): Payer: Medicare HMO

## 2019-04-04 ENCOUNTER — Inpatient Hospital Stay: Payer: Medicare HMO | Admitting: Internal Medicine

## 2019-04-04 ENCOUNTER — Telehealth: Payer: Self-pay

## 2019-04-04 ENCOUNTER — Telehealth: Payer: Self-pay | Admitting: Medical Oncology

## 2019-04-04 ENCOUNTER — Inpatient Hospital Stay: Payer: Medicare HMO | Attending: Internal Medicine

## 2019-04-04 NOTE — Telephone Encounter (Signed)
Mrs Caraway left v/m checking on status of cholesterol refill. Rosuvastatin refilled on 03/28/19 to [facility]. Per DPR left v/m with this info on it.

## 2019-04-04 NOTE — Telephone Encounter (Signed)
LVM for pt to return call about missed CT and to call back to reschedule CT and f/u appts.

## 2019-04-06 ENCOUNTER — Telehealth: Payer: Self-pay

## 2019-04-06 ENCOUNTER — Other Ambulatory Visit: Payer: Self-pay | Admitting: Family Medicine

## 2019-04-06 NOTE — Telephone Encounter (Signed)
[name] request refill for Clopidogrel 75 mg and I advised refill done # 90 x 3 on 03/28/19 to [facility]. [name] will ck with pharmacy.

## 2019-04-12 DIAGNOSIS — Z79891 Long term (current) use of opiate analgesic: Secondary | ICD-10-CM | POA: Diagnosis not present

## 2019-04-12 DIAGNOSIS — G894 Chronic pain syndrome: Secondary | ICD-10-CM | POA: Diagnosis not present

## 2019-04-12 DIAGNOSIS — M961 Postlaminectomy syndrome, not elsewhere classified: Secondary | ICD-10-CM | POA: Diagnosis not present

## 2019-04-12 DIAGNOSIS — M47812 Spondylosis without myelopathy or radiculopathy, cervical region: Secondary | ICD-10-CM | POA: Diagnosis not present

## 2019-04-15 ENCOUNTER — Other Ambulatory Visit: Payer: Self-pay | Admitting: Family Medicine

## 2019-04-15 ENCOUNTER — Telehealth: Payer: Self-pay

## 2019-04-15 DIAGNOSIS — Z7902 Long term (current) use of antithrombotics/antiplatelets: Secondary | ICD-10-CM

## 2019-04-15 DIAGNOSIS — Z79891 Long term (current) use of opiate analgesic: Secondary | ICD-10-CM | POA: Diagnosis not present

## 2019-04-15 DIAGNOSIS — R195 Other fecal abnormalities: Secondary | ICD-10-CM

## 2019-04-15 DIAGNOSIS — Z1211 Encounter for screening for malignant neoplasm of colon: Secondary | ICD-10-CM

## 2019-04-15 NOTE — Telephone Encounter (Signed)
done

## 2019-04-15 NOTE — Telephone Encounter (Signed)
[name] called and states patient did a stool FIT test through his insurance (received a kit from insurance to complete) his result was positive -this was collected on 03/24/2019. Patient does want to proceed with Colonoscopy referral at this time. I did advise Mrs Punches that Dr Copland placed a referral to GI about a month ago and patient never responded to [facility] office. Advised to make sure patient responds to the calls so they can get him scheduled. Will need a new referral for this. Please review. Thank you 

## 2019-04-27 ENCOUNTER — Telehealth: Payer: Self-pay

## 2019-04-27 NOTE — Telephone Encounter (Signed)
[facility] LMOM for pt to call them today.  I was able to reach pt and she is calling them now.

## 2019-04-27 NOTE — Telephone Encounter (Signed)
pts wife (DPR signed) and wants status of colonoscopy referral.

## 2019-04-27 NOTE — Telephone Encounter (Signed)
Error see referral note 111/20/20.

## 2019-05-02 DIAGNOSIS — L821 Other seborrheic keratosis: Secondary | ICD-10-CM | POA: Diagnosis not present

## 2019-05-02 DIAGNOSIS — C44629 Squamous cell carcinoma of skin of left upper limb, including shoulder: Secondary | ICD-10-CM | POA: Diagnosis not present

## 2019-05-02 DIAGNOSIS — D485 Neoplasm of uncertain behavior of skin: Secondary | ICD-10-CM | POA: Diagnosis not present

## 2019-05-02 DIAGNOSIS — C44622 Squamous cell carcinoma of skin of right upper limb, including shoulder: Secondary | ICD-10-CM | POA: Diagnosis not present

## 2019-05-09 DIAGNOSIS — C44629 Squamous cell carcinoma of skin of left upper limb, including shoulder: Secondary | ICD-10-CM | POA: Diagnosis not present

## 2019-05-09 DIAGNOSIS — C44622 Squamous cell carcinoma of skin of right upper limb, including shoulder: Secondary | ICD-10-CM | POA: Diagnosis not present

## 2019-05-12 ENCOUNTER — Telehealth: Payer: Self-pay | Admitting: Family Medicine

## 2019-05-12 DIAGNOSIS — Z1211 Encounter for screening for malignant neoplasm of colon: Secondary | ICD-10-CM

## 2019-05-12 NOTE — Telephone Encounter (Signed)
Received a call with Colorectal screening results Pt is positive -- they are mailing the results, should receive this in the next few days.

## 2019-05-16 NOTE — Telephone Encounter (Signed)
Left message for Tramond or Eustaquio Maize to return my call.

## 2019-05-16 NOTE — Telephone Encounter (Signed)
Health Maintenance  Topic Date Due   FOOT EXAM  08/13/1960   OPHTHALMOLOGY EXAM  08/13/1960   COLONOSCOPY  02/08/2019   TETANUS/TDAP  02/21/2020 (Originally 08/13/1969)   HEMOGLOBIN A1C  08/21/2019   PNA vac Low Risk Adult (2 of 2 - PPSV23) 02/21/2020   INFLUENZA VACCINE  Completed   Hepatitis C Screening  Completed    Can you call him  His colorectal screening came back positive.  He should have a follow-up colonoscopy.  I have no idea who to send him to in [place], but I can try to find out or send to someone in [place] - his preference

## 2019-05-17 NOTE — Telephone Encounter (Signed)
I can not find where a cologuard test was ordered on [name]. I spoke with [name].  She states [name] did a stool card test through his insurance and it was positive.  He is agreeable to the referral for colonoscopy and is okay to be referred to a GI doctor in [place].

## 2019-05-18 NOTE — Telephone Encounter (Signed)
Called [name] and made them aware that patient is on antiplatelet therapy, they will call him to schedule a Dr visit.

## 2019-05-18 NOTE — Telephone Encounter (Signed)
Along with his need for colon cancer screening, the patient is also on antiplatelet therapy, so typically GI will want to see him prior to colonoscopy.  I would confirm this with GI intake.

## 2019-05-23 DIAGNOSIS — Z951 Presence of aortocoronary bypass graft: Secondary | ICD-10-CM | POA: Diagnosis not present

## 2019-05-23 DIAGNOSIS — M542 Cervicalgia: Secondary | ICD-10-CM | POA: Diagnosis not present

## 2019-05-23 DIAGNOSIS — R208 Other disturbances of skin sensation: Secondary | ICD-10-CM | POA: Diagnosis not present

## 2019-05-23 DIAGNOSIS — R69 Illness, unspecified: Secondary | ICD-10-CM | POA: Diagnosis not present

## 2019-05-23 DIAGNOSIS — K219 Gastro-esophageal reflux disease without esophagitis: Secondary | ICD-10-CM | POA: Diagnosis not present

## 2019-05-23 DIAGNOSIS — Z125 Encounter for screening for malignant neoplasm of prostate: Secondary | ICD-10-CM | POA: Diagnosis not present

## 2019-05-23 DIAGNOSIS — I2510 Atherosclerotic heart disease of native coronary artery without angina pectoris: Secondary | ICD-10-CM | POA: Diagnosis not present

## 2019-05-23 DIAGNOSIS — R195 Other fecal abnormalities: Secondary | ICD-10-CM | POA: Diagnosis not present

## 2019-05-23 DIAGNOSIS — M5137 Other intervertebral disc degeneration, lumbosacral region: Secondary | ICD-10-CM | POA: Diagnosis not present

## 2019-05-23 DIAGNOSIS — M5020 Other cervical disc displacement, unspecified cervical region: Secondary | ICD-10-CM | POA: Diagnosis not present

## 2019-05-23 DIAGNOSIS — I10 Essential (primary) hypertension: Secondary | ICD-10-CM | POA: Diagnosis not present

## 2019-05-23 DIAGNOSIS — M545 Low back pain: Secondary | ICD-10-CM | POA: Diagnosis not present

## 2019-05-24 ENCOUNTER — Telehealth: Payer: Self-pay | Admitting: Family Medicine

## 2019-05-24 NOTE — Telephone Encounter (Signed)
Called patients wife because they havent called back. Wife says patient has a New PCP and that Dr is setting him up with a New GI Dr because we are too far away and so is [facility]. I will hold onto the GI Referral for a couple of weeks in case they change their minds and need [facility].

## 2019-05-24 NOTE — Telephone Encounter (Signed)
noted 

## 2019-06-08 DIAGNOSIS — G894 Chronic pain syndrome: Secondary | ICD-10-CM | POA: Diagnosis not present

## 2019-06-08 DIAGNOSIS — Z79891 Long term (current) use of opiate analgesic: Secondary | ICD-10-CM | POA: Diagnosis not present

## 2019-06-08 DIAGNOSIS — M961 Postlaminectomy syndrome, not elsewhere classified: Secondary | ICD-10-CM | POA: Diagnosis not present

## 2019-06-08 DIAGNOSIS — M47812 Spondylosis without myelopathy or radiculopathy, cervical region: Secondary | ICD-10-CM | POA: Diagnosis not present

## 2019-06-14 DIAGNOSIS — R195 Other fecal abnormalities: Secondary | ICD-10-CM | POA: Diagnosis not present

## 2019-06-14 DIAGNOSIS — K5909 Other constipation: Secondary | ICD-10-CM | POA: Diagnosis not present

## 2019-06-16 DIAGNOSIS — H2513 Age-related nuclear cataract, bilateral: Secondary | ICD-10-CM | POA: Diagnosis not present

## 2019-06-20 ENCOUNTER — Other Ambulatory Visit: Payer: Self-pay | Admitting: Family Medicine

## 2019-06-20 NOTE — Telephone Encounter (Signed)
Last office visit 02/21/2019 for GERD.  Last refilled 09/01/2018 for #40 with 3 refills.  No future appointments.

## 2019-06-24 DIAGNOSIS — I10 Essential (primary) hypertension: Secondary | ICD-10-CM | POA: Diagnosis not present

## 2019-06-24 DIAGNOSIS — M545 Low back pain: Secondary | ICD-10-CM | POA: Diagnosis not present

## 2019-06-24 DIAGNOSIS — M961 Postlaminectomy syndrome, not elsewhere classified: Secondary | ICD-10-CM | POA: Diagnosis not present

## 2019-06-24 DIAGNOSIS — M5126 Other intervertebral disc displacement, lumbar region: Secondary | ICD-10-CM | POA: Diagnosis not present

## 2019-06-24 DIAGNOSIS — Z79891 Long term (current) use of opiate analgesic: Secondary | ICD-10-CM | POA: Diagnosis not present

## 2019-06-24 DIAGNOSIS — M47816 Spondylosis without myelopathy or radiculopathy, lumbar region: Secondary | ICD-10-CM | POA: Diagnosis not present

## 2019-06-24 DIAGNOSIS — M542 Cervicalgia: Secondary | ICD-10-CM | POA: Diagnosis not present

## 2019-06-27 ENCOUNTER — Other Ambulatory Visit: Payer: Self-pay | Admitting: Family Medicine

## 2019-06-27 NOTE — Telephone Encounter (Signed)
Spoke with [employer].  She states they have established care with new PCP in [place].  Refill denied and Dr. Lorelei Pont removed as PCP.

## 2019-06-27 NOTE — Telephone Encounter (Signed)
Last office visit 02/21/2019 for GERD.  Last refilled 02/28/2019 for #60 with 3 refill.  According to phone note on 05/24/2019 patient has a new PCP.   No future appointments.

## 2019-06-27 NOTE — Telephone Encounter (Signed)
Please call them.  [name] told me that [name] already had a new primary care doctor in [place].  At this point, all refills should be done by his primary care doctor.

## 2019-07-01 DIAGNOSIS — K648 Other hemorrhoids: Secondary | ICD-10-CM | POA: Diagnosis not present

## 2019-07-01 DIAGNOSIS — K635 Polyp of colon: Secondary | ICD-10-CM | POA: Diagnosis not present

## 2019-07-01 DIAGNOSIS — R195 Other fecal abnormalities: Secondary | ICD-10-CM | POA: Diagnosis not present

## 2019-07-01 DIAGNOSIS — K5900 Constipation, unspecified: Secondary | ICD-10-CM | POA: Diagnosis not present

## 2019-07-01 DIAGNOSIS — D126 Benign neoplasm of colon, unspecified: Secondary | ICD-10-CM | POA: Diagnosis not present

## 2019-07-08 DIAGNOSIS — H02052 Trichiasis without entropian right lower eyelid: Secondary | ICD-10-CM | POA: Diagnosis not present

## 2019-07-08 DIAGNOSIS — H02054 Trichiasis without entropian left upper eyelid: Secondary | ICD-10-CM | POA: Diagnosis not present

## 2019-07-08 DIAGNOSIS — H4720 Unspecified optic atrophy: Secondary | ICD-10-CM | POA: Diagnosis not present

## 2019-07-08 DIAGNOSIS — Z9889 Other specified postprocedural states: Secondary | ICD-10-CM | POA: Diagnosis not present

## 2019-07-08 DIAGNOSIS — H02051 Trichiasis without entropian right upper eyelid: Secondary | ICD-10-CM | POA: Diagnosis not present

## 2019-07-08 DIAGNOSIS — H18513 Endothelial corneal dystrophy, bilateral: Secondary | ICD-10-CM | POA: Diagnosis not present

## 2019-07-08 DIAGNOSIS — H02055 Trichiasis without entropian left lower eyelid: Secondary | ICD-10-CM | POA: Diagnosis not present

## 2019-07-08 DIAGNOSIS — H2513 Age-related nuclear cataract, bilateral: Secondary | ICD-10-CM | POA: Diagnosis not present

## 2019-07-08 DIAGNOSIS — H179 Unspecified corneal scar and opacity: Secondary | ICD-10-CM | POA: Diagnosis not present

## 2019-07-08 DIAGNOSIS — H04123 Dry eye syndrome of bilateral lacrimal glands: Secondary | ICD-10-CM | POA: Diagnosis not present

## 2019-07-19 DIAGNOSIS — Z951 Presence of aortocoronary bypass graft: Secondary | ICD-10-CM | POA: Diagnosis not present

## 2019-07-19 DIAGNOSIS — I2510 Atherosclerotic heart disease of native coronary artery without angina pectoris: Secondary | ICD-10-CM | POA: Diagnosis not present

## 2019-07-19 DIAGNOSIS — Z01818 Encounter for other preprocedural examination: Secondary | ICD-10-CM | POA: Diagnosis not present

## 2019-07-21 ENCOUNTER — Other Ambulatory Visit: Payer: Self-pay | Admitting: Family Medicine

## 2019-08-01 DIAGNOSIS — Z01812 Encounter for preprocedural laboratory examination: Secondary | ICD-10-CM | POA: Diagnosis not present

## 2019-08-01 DIAGNOSIS — Z20822 Contact with and (suspected) exposure to covid-19: Secondary | ICD-10-CM | POA: Diagnosis not present

## 2019-08-03 DIAGNOSIS — Z79891 Long term (current) use of opiate analgesic: Secondary | ICD-10-CM | POA: Diagnosis not present

## 2019-08-03 DIAGNOSIS — M47812 Spondylosis without myelopathy or radiculopathy, cervical region: Secondary | ICD-10-CM | POA: Diagnosis not present

## 2019-08-03 DIAGNOSIS — G894 Chronic pain syndrome: Secondary | ICD-10-CM | POA: Diagnosis not present

## 2019-08-03 DIAGNOSIS — M961 Postlaminectomy syndrome, not elsewhere classified: Secondary | ICD-10-CM | POA: Diagnosis not present

## 2019-08-08 DIAGNOSIS — K2100 Gastro-esophageal reflux disease with esophagitis, without bleeding: Secondary | ICD-10-CM | POA: Diagnosis not present

## 2019-08-08 DIAGNOSIS — J690 Pneumonitis due to inhalation of food and vomit: Secondary | ICD-10-CM | POA: Diagnosis not present

## 2019-08-10 DIAGNOSIS — E785 Hyperlipidemia, unspecified: Secondary | ICD-10-CM | POA: Diagnosis not present

## 2019-08-10 DIAGNOSIS — Z955 Presence of coronary angioplasty implant and graft: Secondary | ICD-10-CM | POA: Diagnosis not present

## 2019-08-10 DIAGNOSIS — I2510 Atherosclerotic heart disease of native coronary artery without angina pectoris: Secondary | ICD-10-CM | POA: Diagnosis not present

## 2019-08-10 DIAGNOSIS — I10 Essential (primary) hypertension: Secondary | ICD-10-CM | POA: Diagnosis not present

## 2019-08-10 DIAGNOSIS — Z951 Presence of aortocoronary bypass graft: Secondary | ICD-10-CM | POA: Diagnosis not present

## 2019-08-15 DIAGNOSIS — Z7982 Long term (current) use of aspirin: Secondary | ICD-10-CM | POA: Diagnosis not present

## 2019-08-15 DIAGNOSIS — I25119 Atherosclerotic heart disease of native coronary artery with unspecified angina pectoris: Secondary | ICD-10-CM | POA: Diagnosis not present

## 2019-08-15 DIAGNOSIS — Z955 Presence of coronary angioplasty implant and graft: Secondary | ICD-10-CM | POA: Diagnosis not present

## 2019-08-15 DIAGNOSIS — I70213 Atherosclerosis of native arteries of extremities with intermittent claudication, bilateral legs: Secondary | ICD-10-CM | POA: Diagnosis not present

## 2019-08-15 DIAGNOSIS — Z95820 Peripheral vascular angioplasty status with implants and grafts: Secondary | ICD-10-CM | POA: Diagnosis not present

## 2019-08-15 DIAGNOSIS — Z20822 Contact with and (suspected) exposure to covid-19: Secondary | ICD-10-CM | POA: Diagnosis not present

## 2019-08-15 DIAGNOSIS — Z951 Presence of aortocoronary bypass graft: Secondary | ICD-10-CM | POA: Diagnosis not present

## 2019-08-15 DIAGNOSIS — E785 Hyperlipidemia, unspecified: Secondary | ICD-10-CM | POA: Diagnosis not present

## 2019-08-15 DIAGNOSIS — Z79891 Long term (current) use of opiate analgesic: Secondary | ICD-10-CM | POA: Diagnosis not present

## 2019-08-15 DIAGNOSIS — Z79899 Other long term (current) drug therapy: Secondary | ICD-10-CM | POA: Diagnosis not present

## 2019-08-17 DIAGNOSIS — I739 Peripheral vascular disease, unspecified: Secondary | ICD-10-CM | POA: Diagnosis not present

## 2019-08-17 DIAGNOSIS — I25118 Atherosclerotic heart disease of native coronary artery with other forms of angina pectoris: Secondary | ICD-10-CM | POA: Diagnosis not present

## 2019-08-17 DIAGNOSIS — I70213 Atherosclerosis of native arteries of extremities with intermittent claudication, bilateral legs: Secondary | ICD-10-CM | POA: Diagnosis not present

## 2019-08-17 DIAGNOSIS — I700 Atherosclerosis of aorta: Secondary | ICD-10-CM | POA: Diagnosis not present

## 2019-08-17 DIAGNOSIS — Z951 Presence of aortocoronary bypass graft: Secondary | ICD-10-CM | POA: Diagnosis not present

## 2019-08-19 DIAGNOSIS — I2510 Atherosclerotic heart disease of native coronary artery without angina pectoris: Secondary | ICD-10-CM | POA: Diagnosis not present

## 2019-08-29 DIAGNOSIS — K2270 Barrett's esophagus without dysplasia: Secondary | ICD-10-CM | POA: Diagnosis not present

## 2019-08-29 DIAGNOSIS — K219 Gastro-esophageal reflux disease without esophagitis: Secondary | ICD-10-CM | POA: Diagnosis not present

## 2019-09-12 DIAGNOSIS — L821 Other seborrheic keratosis: Secondary | ICD-10-CM | POA: Diagnosis not present

## 2019-09-12 DIAGNOSIS — Z08 Encounter for follow-up examination after completed treatment for malignant neoplasm: Secondary | ICD-10-CM | POA: Diagnosis not present

## 2019-09-12 DIAGNOSIS — Z85828 Personal history of other malignant neoplasm of skin: Secondary | ICD-10-CM | POA: Diagnosis not present

## 2019-09-12 DIAGNOSIS — Z7189 Other specified counseling: Secondary | ICD-10-CM | POA: Diagnosis not present

## 2019-09-12 DIAGNOSIS — K429 Umbilical hernia without obstruction or gangrene: Secondary | ICD-10-CM | POA: Diagnosis not present

## 2019-09-12 DIAGNOSIS — L570 Actinic keratosis: Secondary | ICD-10-CM | POA: Diagnosis not present

## 2019-09-12 DIAGNOSIS — X32XXXA Exposure to sunlight, initial encounter: Secondary | ICD-10-CM | POA: Diagnosis not present

## 2019-09-12 DIAGNOSIS — L853 Xerosis cutis: Secondary | ICD-10-CM | POA: Diagnosis not present

## 2019-09-12 DIAGNOSIS — B351 Tinea unguium: Secondary | ICD-10-CM | POA: Diagnosis not present

## 2019-09-19 DIAGNOSIS — Z01812 Encounter for preprocedural laboratory examination: Secondary | ICD-10-CM | POA: Diagnosis not present

## 2019-09-19 DIAGNOSIS — Z20822 Contact with and (suspected) exposure to covid-19: Secondary | ICD-10-CM | POA: Diagnosis not present

## 2019-09-29 DIAGNOSIS — I739 Peripheral vascular disease, unspecified: Secondary | ICD-10-CM | POA: Diagnosis not present

## 2019-09-29 DIAGNOSIS — K219 Gastro-esophageal reflux disease without esophagitis: Secondary | ICD-10-CM | POA: Diagnosis not present

## 2019-09-29 DIAGNOSIS — I2510 Atherosclerotic heart disease of native coronary artery without angina pectoris: Secondary | ICD-10-CM | POA: Diagnosis not present

## 2019-09-29 DIAGNOSIS — D126 Benign neoplasm of colon, unspecified: Secondary | ICD-10-CM | POA: Diagnosis not present

## 2019-10-01 ENCOUNTER — Other Ambulatory Visit: Payer: Self-pay | Admitting: Family Medicine

## 2019-10-03 DIAGNOSIS — Z79891 Long term (current) use of opiate analgesic: Secondary | ICD-10-CM | POA: Diagnosis not present

## 2019-10-03 DIAGNOSIS — Z885 Allergy status to narcotic agent status: Secondary | ICD-10-CM | POA: Diagnosis not present

## 2019-10-03 DIAGNOSIS — Z85118 Personal history of other malignant neoplasm of bronchus and lung: Secondary | ICD-10-CM | POA: Diagnosis not present

## 2019-10-03 DIAGNOSIS — Z20822 Contact with and (suspected) exposure to covid-19: Secondary | ICD-10-CM | POA: Diagnosis not present

## 2019-10-03 DIAGNOSIS — Z79899 Other long term (current) drug therapy: Secondary | ICD-10-CM | POA: Diagnosis not present

## 2019-10-03 DIAGNOSIS — I519 Heart disease, unspecified: Secondary | ICD-10-CM | POA: Diagnosis not present

## 2019-10-03 DIAGNOSIS — H2511 Age-related nuclear cataract, right eye: Secondary | ICD-10-CM | POA: Diagnosis not present

## 2019-10-05 DIAGNOSIS — H2511 Age-related nuclear cataract, right eye: Secondary | ICD-10-CM | POA: Diagnosis not present

## 2019-10-05 DIAGNOSIS — H547 Unspecified visual loss: Secondary | ICD-10-CM | POA: Diagnosis not present

## 2019-10-05 DIAGNOSIS — J449 Chronic obstructive pulmonary disease, unspecified: Secondary | ICD-10-CM | POA: Diagnosis not present

## 2019-10-05 DIAGNOSIS — I10 Essential (primary) hypertension: Secondary | ICD-10-CM | POA: Diagnosis not present

## 2019-10-06 ENCOUNTER — Other Ambulatory Visit: Payer: Self-pay | Admitting: Family Medicine

## 2019-10-17 DIAGNOSIS — K2940 Chronic atrophic gastritis without bleeding: Secondary | ICD-10-CM | POA: Diagnosis not present

## 2019-10-17 DIAGNOSIS — K219 Gastro-esophageal reflux disease without esophagitis: Secondary | ICD-10-CM | POA: Diagnosis not present

## 2019-10-17 DIAGNOSIS — K2970 Gastritis, unspecified, without bleeding: Secondary | ICD-10-CM | POA: Diagnosis not present

## 2019-10-17 DIAGNOSIS — K2270 Barrett's esophagus without dysplasia: Secondary | ICD-10-CM | POA: Diagnosis not present

## 2019-10-27 ENCOUNTER — Other Ambulatory Visit: Payer: Self-pay | Admitting: Family Medicine

## 2019-11-02 DIAGNOSIS — Z87891 Personal history of nicotine dependence: Secondary | ICD-10-CM | POA: Diagnosis not present

## 2019-11-02 DIAGNOSIS — K219 Gastro-esophageal reflux disease without esophagitis: Secondary | ICD-10-CM | POA: Diagnosis not present

## 2019-11-02 DIAGNOSIS — J449 Chronic obstructive pulmonary disease, unspecified: Secondary | ICD-10-CM | POA: Diagnosis not present

## 2019-11-02 DIAGNOSIS — H2512 Age-related nuclear cataract, left eye: Secondary | ICD-10-CM | POA: Diagnosis not present

## 2019-11-02 DIAGNOSIS — Z885 Allergy status to narcotic agent status: Secondary | ICD-10-CM | POA: Diagnosis not present

## 2019-11-02 DIAGNOSIS — Z79899 Other long term (current) drug therapy: Secondary | ICD-10-CM | POA: Diagnosis not present

## 2019-11-02 DIAGNOSIS — I10 Essential (primary) hypertension: Secondary | ICD-10-CM | POA: Diagnosis not present

## 2019-11-02 DIAGNOSIS — Z79891 Long term (current) use of opiate analgesic: Secondary | ICD-10-CM | POA: Diagnosis not present

## 2019-11-02 DIAGNOSIS — H547 Unspecified visual loss: Secondary | ICD-10-CM | POA: Diagnosis not present

## 2019-11-02 DIAGNOSIS — I119 Hypertensive heart disease without heart failure: Secondary | ICD-10-CM | POA: Diagnosis not present

## 2019-11-02 DIAGNOSIS — Z85118 Personal history of other malignant neoplasm of bronchus and lung: Secondary | ICD-10-CM | POA: Diagnosis not present

## 2019-11-02 DIAGNOSIS — Z951 Presence of aortocoronary bypass graft: Secondary | ICD-10-CM | POA: Diagnosis not present

## 2019-11-03 DIAGNOSIS — H2512 Age-related nuclear cataract, left eye: Secondary | ICD-10-CM | POA: Diagnosis not present

## 2019-11-03 DIAGNOSIS — Z961 Presence of intraocular lens: Secondary | ICD-10-CM | POA: Diagnosis not present

## 2019-11-23 DIAGNOSIS — M961 Postlaminectomy syndrome, not elsewhere classified: Secondary | ICD-10-CM | POA: Diagnosis not present

## 2019-11-23 DIAGNOSIS — M47812 Spondylosis without myelopathy or radiculopathy, cervical region: Secondary | ICD-10-CM | POA: Diagnosis not present

## 2019-11-23 DIAGNOSIS — G894 Chronic pain syndrome: Secondary | ICD-10-CM | POA: Diagnosis not present

## 2019-11-23 DIAGNOSIS — Z79891 Long term (current) use of opiate analgesic: Secondary | ICD-10-CM | POA: Diagnosis not present

## 2019-12-14 DIAGNOSIS — H02052 Trichiasis without entropian right lower eyelid: Secondary | ICD-10-CM | POA: Diagnosis not present

## 2019-12-14 DIAGNOSIS — H02055 Trichiasis without entropian left lower eyelid: Secondary | ICD-10-CM | POA: Diagnosis not present

## 2019-12-21 DIAGNOSIS — I10 Essential (primary) hypertension: Secondary | ICD-10-CM | POA: Diagnosis not present

## 2019-12-21 DIAGNOSIS — J449 Chronic obstructive pulmonary disease, unspecified: Secondary | ICD-10-CM | POA: Diagnosis not present

## 2019-12-21 DIAGNOSIS — Z79899 Other long term (current) drug therapy: Secondary | ICD-10-CM | POA: Diagnosis not present

## 2019-12-21 DIAGNOSIS — Z7982 Long term (current) use of aspirin: Secondary | ICD-10-CM | POA: Diagnosis not present

## 2019-12-21 DIAGNOSIS — T1490XA Injury, unspecified, initial encounter: Secondary | ICD-10-CM | POA: Diagnosis not present

## 2019-12-21 DIAGNOSIS — S0121XA Laceration without foreign body of nose, initial encounter: Secondary | ICD-10-CM | POA: Diagnosis not present

## 2019-12-21 DIAGNOSIS — K219 Gastro-esophageal reflux disease without esophagitis: Secondary | ICD-10-CM | POA: Diagnosis not present

## 2019-12-21 DIAGNOSIS — Z7902 Long term (current) use of antithrombotics/antiplatelets: Secondary | ICD-10-CM | POA: Diagnosis not present

## 2019-12-21 DIAGNOSIS — G8911 Acute pain due to trauma: Secondary | ICD-10-CM | POA: Diagnosis not present

## 2019-12-21 DIAGNOSIS — Z23 Encounter for immunization: Secondary | ICD-10-CM | POA: Diagnosis not present

## 2019-12-21 DIAGNOSIS — W270XXA Contact with workbench tool, initial encounter: Secondary | ICD-10-CM | POA: Diagnosis not present

## 2019-12-21 DIAGNOSIS — Z87891 Personal history of nicotine dependence: Secondary | ICD-10-CM | POA: Diagnosis not present

## 2020-01-19 DIAGNOSIS — G894 Chronic pain syndrome: Secondary | ICD-10-CM | POA: Diagnosis not present

## 2020-01-19 DIAGNOSIS — M961 Postlaminectomy syndrome, not elsewhere classified: Secondary | ICD-10-CM | POA: Diagnosis not present

## 2020-01-19 DIAGNOSIS — Z79891 Long term (current) use of opiate analgesic: Secondary | ICD-10-CM | POA: Diagnosis not present

## 2020-01-19 DIAGNOSIS — M47812 Spondylosis without myelopathy or radiculopathy, cervical region: Secondary | ICD-10-CM | POA: Diagnosis not present

## 2020-01-31 DIAGNOSIS — R69 Illness, unspecified: Secondary | ICD-10-CM | POA: Diagnosis not present

## 2020-02-18 DIAGNOSIS — R69 Illness, unspecified: Secondary | ICD-10-CM | POA: Diagnosis not present

## 2020-02-20 ENCOUNTER — Other Ambulatory Visit: Payer: Self-pay | Admitting: Family Medicine

## 2020-03-13 ENCOUNTER — Other Ambulatory Visit: Payer: Self-pay | Admitting: Family Medicine

## 2020-03-20 DIAGNOSIS — H179 Unspecified corneal scar and opacity: Secondary | ICD-10-CM | POA: Diagnosis not present

## 2020-03-20 DIAGNOSIS — Z9889 Other specified postprocedural states: Secondary | ICD-10-CM | POA: Diagnosis not present

## 2020-03-20 DIAGNOSIS — Z961 Presence of intraocular lens: Secondary | ICD-10-CM | POA: Diagnosis not present

## 2020-03-20 DIAGNOSIS — H43813 Vitreous degeneration, bilateral: Secondary | ICD-10-CM | POA: Diagnosis not present

## 2020-03-20 DIAGNOSIS — H02055 Trichiasis without entropian left lower eyelid: Secondary | ICD-10-CM | POA: Diagnosis not present

## 2020-03-20 DIAGNOSIS — H4720 Unspecified optic atrophy: Secondary | ICD-10-CM | POA: Diagnosis not present

## 2020-03-20 DIAGNOSIS — H02052 Trichiasis without entropian right lower eyelid: Secondary | ICD-10-CM | POA: Diagnosis not present

## 2020-03-20 DIAGNOSIS — H02051 Trichiasis without entropian right upper eyelid: Secondary | ICD-10-CM | POA: Diagnosis not present

## 2020-03-20 DIAGNOSIS — H04123 Dry eye syndrome of bilateral lacrimal glands: Secondary | ICD-10-CM | POA: Diagnosis not present

## 2020-03-26 ENCOUNTER — Other Ambulatory Visit: Payer: Self-pay | Admitting: Family Medicine

## 2020-03-28 DIAGNOSIS — Z23 Encounter for immunization: Secondary | ICD-10-CM | POA: Diagnosis not present

## 2020-03-28 DIAGNOSIS — N183 Chronic kidney disease, stage 3 unspecified: Secondary | ICD-10-CM | POA: Diagnosis not present

## 2020-03-28 DIAGNOSIS — Z85118 Personal history of other malignant neoplasm of bronchus and lung: Secondary | ICD-10-CM | POA: Diagnosis not present

## 2020-03-28 DIAGNOSIS — R61 Generalized hyperhidrosis: Secondary | ICD-10-CM | POA: Diagnosis not present

## 2020-03-28 DIAGNOSIS — J449 Chronic obstructive pulmonary disease, unspecified: Secondary | ICD-10-CM | POA: Diagnosis not present

## 2020-04-10 DIAGNOSIS — G894 Chronic pain syndrome: Secondary | ICD-10-CM | POA: Diagnosis not present

## 2020-04-10 DIAGNOSIS — Z79891 Long term (current) use of opiate analgesic: Secondary | ICD-10-CM | POA: Diagnosis not present

## 2020-04-10 DIAGNOSIS — M47812 Spondylosis without myelopathy or radiculopathy, cervical region: Secondary | ICD-10-CM | POA: Diagnosis not present

## 2020-04-10 DIAGNOSIS — M961 Postlaminectomy syndrome, not elsewhere classified: Secondary | ICD-10-CM | POA: Diagnosis not present

## 2020-04-25 ENCOUNTER — Other Ambulatory Visit: Payer: Self-pay | Admitting: Family Medicine

## 2020-04-30 ENCOUNTER — Other Ambulatory Visit: Payer: Self-pay | Admitting: Family Medicine

## 2020-05-17 ENCOUNTER — Other Ambulatory Visit: Payer: Self-pay | Admitting: Family Medicine

## 2020-05-22 ENCOUNTER — Other Ambulatory Visit: Payer: Self-pay | Admitting: Family Medicine

## 2020-06-05 DIAGNOSIS — Z79891 Long term (current) use of opiate analgesic: Secondary | ICD-10-CM | POA: Diagnosis not present

## 2020-06-05 DIAGNOSIS — M47812 Spondylosis without myelopathy or radiculopathy, cervical region: Secondary | ICD-10-CM | POA: Diagnosis not present

## 2020-06-05 DIAGNOSIS — G894 Chronic pain syndrome: Secondary | ICD-10-CM | POA: Diagnosis not present

## 2020-06-05 DIAGNOSIS — M961 Postlaminectomy syndrome, not elsewhere classified: Secondary | ICD-10-CM | POA: Diagnosis not present

## 2020-07-31 DIAGNOSIS — G894 Chronic pain syndrome: Secondary | ICD-10-CM | POA: Diagnosis not present

## 2020-07-31 DIAGNOSIS — M961 Postlaminectomy syndrome, not elsewhere classified: Secondary | ICD-10-CM | POA: Diagnosis not present

## 2020-07-31 DIAGNOSIS — M47812 Spondylosis without myelopathy or radiculopathy, cervical region: Secondary | ICD-10-CM | POA: Diagnosis not present

## 2020-07-31 DIAGNOSIS — Z79891 Long term (current) use of opiate analgesic: Secondary | ICD-10-CM | POA: Diagnosis not present

## 2020-09-25 DIAGNOSIS — M47812 Spondylosis without myelopathy or radiculopathy, cervical region: Secondary | ICD-10-CM | POA: Diagnosis not present

## 2020-09-25 DIAGNOSIS — Z79891 Long term (current) use of opiate analgesic: Secondary | ICD-10-CM | POA: Diagnosis not present

## 2020-09-25 DIAGNOSIS — G894 Chronic pain syndrome: Secondary | ICD-10-CM | POA: Diagnosis not present

## 2020-09-25 DIAGNOSIS — M961 Postlaminectomy syndrome, not elsewhere classified: Secondary | ICD-10-CM | POA: Diagnosis not present

## 2020-12-27 DIAGNOSIS — M961 Postlaminectomy syndrome, not elsewhere classified: Secondary | ICD-10-CM | POA: Diagnosis not present

## 2020-12-27 DIAGNOSIS — M47812 Spondylosis without myelopathy or radiculopathy, cervical region: Secondary | ICD-10-CM | POA: Diagnosis not present

## 2020-12-27 DIAGNOSIS — Z79891 Long term (current) use of opiate analgesic: Secondary | ICD-10-CM | POA: Diagnosis not present

## 2020-12-27 DIAGNOSIS — G894 Chronic pain syndrome: Secondary | ICD-10-CM | POA: Diagnosis not present

## 2021-01-07 DIAGNOSIS — L570 Actinic keratosis: Secondary | ICD-10-CM | POA: Diagnosis not present

## 2021-01-07 DIAGNOSIS — L821 Other seborrheic keratosis: Secondary | ICD-10-CM | POA: Diagnosis not present

## 2021-01-07 DIAGNOSIS — X32XXXA Exposure to sunlight, initial encounter: Secondary | ICD-10-CM | POA: Diagnosis not present

## 2021-01-07 DIAGNOSIS — R39198 Other difficulties with micturition: Secondary | ICD-10-CM | POA: Diagnosis not present

## 2021-01-07 DIAGNOSIS — R0609 Other forms of dyspnea: Secondary | ICD-10-CM | POA: Diagnosis not present

## 2021-01-07 DIAGNOSIS — Z85118 Personal history of other malignant neoplasm of bronchus and lung: Secondary | ICD-10-CM | POA: Diagnosis not present

## 2021-01-09 DIAGNOSIS — Z85118 Personal history of other malignant neoplasm of bronchus and lung: Secondary | ICD-10-CM | POA: Diagnosis not present

## 2021-01-17 DIAGNOSIS — R918 Other nonspecific abnormal finding of lung field: Secondary | ICD-10-CM | POA: Diagnosis not present

## 2021-01-17 DIAGNOSIS — R911 Solitary pulmonary nodule: Secondary | ICD-10-CM | POA: Diagnosis not present

## 2021-01-17 DIAGNOSIS — Z85118 Personal history of other malignant neoplasm of bronchus and lung: Secondary | ICD-10-CM | POA: Diagnosis not present

## 2021-01-17 DIAGNOSIS — R0602 Shortness of breath: Secondary | ICD-10-CM | POA: Diagnosis not present

## 2021-01-17 DIAGNOSIS — J449 Chronic obstructive pulmonary disease, unspecified: Secondary | ICD-10-CM | POA: Diagnosis not present

## 2021-01-31 DIAGNOSIS — J449 Chronic obstructive pulmonary disease, unspecified: Secondary | ICD-10-CM | POA: Diagnosis not present

## 2021-01-31 DIAGNOSIS — J929 Pleural plaque without asbestos: Secondary | ICD-10-CM | POA: Diagnosis not present

## 2021-01-31 DIAGNOSIS — R918 Other nonspecific abnormal finding of lung field: Secondary | ICD-10-CM | POA: Diagnosis not present

## 2021-02-07 DIAGNOSIS — R918 Other nonspecific abnormal finding of lung field: Secondary | ICD-10-CM | POA: Diagnosis not present

## 2021-02-07 DIAGNOSIS — J449 Chronic obstructive pulmonary disease, unspecified: Secondary | ICD-10-CM | POA: Diagnosis not present

## 2021-02-07 DIAGNOSIS — Z85118 Personal history of other malignant neoplasm of bronchus and lung: Secondary | ICD-10-CM | POA: Diagnosis not present

## 2021-02-07 DIAGNOSIS — R0602 Shortness of breath: Secondary | ICD-10-CM | POA: Diagnosis not present

## 2021-02-14 DIAGNOSIS — Z79891 Long term (current) use of opiate analgesic: Secondary | ICD-10-CM | POA: Diagnosis not present

## 2021-02-14 DIAGNOSIS — M47812 Spondylosis without myelopathy or radiculopathy, cervical region: Secondary | ICD-10-CM | POA: Diagnosis not present

## 2021-02-14 DIAGNOSIS — G894 Chronic pain syndrome: Secondary | ICD-10-CM | POA: Diagnosis not present

## 2021-02-14 DIAGNOSIS — M961 Postlaminectomy syndrome, not elsewhere classified: Secondary | ICD-10-CM | POA: Diagnosis not present

## 2022-05-26 DEATH — deceased
